# Patient Record
Sex: Female | Born: 1960
Health system: Southern US, Community
[De-identification: ages and names within clinical notes are randomized; demographics above are authoritative.]

## PROBLEM LIST (undated history)

## (undated) DIAGNOSIS — M549 Dorsalgia, unspecified: Secondary | ICD-10-CM

## (undated) DIAGNOSIS — G5600 Carpal tunnel syndrome, unspecified upper limb: Secondary | ICD-10-CM

## (undated) DIAGNOSIS — M779 Enthesopathy, unspecified: Secondary | ICD-10-CM

## (undated) DIAGNOSIS — I10 Essential (primary) hypertension: Secondary | ICD-10-CM

## (undated) DIAGNOSIS — G8929 Other chronic pain: Secondary | ICD-10-CM

## (undated) DIAGNOSIS — M1990 Unspecified osteoarthritis, unspecified site: Secondary | ICD-10-CM

## (undated) DIAGNOSIS — E119 Type 2 diabetes mellitus without complications: Secondary | ICD-10-CM

## (undated) HISTORY — PX: ORTHOPEDIC SURGERY: SHX850

## (undated) HISTORY — DX: Essential (primary) hypertension: I10

## (undated) HISTORY — PX: ABDOMINAL HYSTERECTOMY: SHX81

## (undated) NOTE — *Deleted (*Deleted)
Advice for Weight Management  -For most of us the best way to lose weight is by diet management. Generally speaking, diet management means consuming less calories intentionally which over time brings about progressive weight loss.  This can be achieved more effectively by restricting carbohydrate consumption to the minimum possible.  So, it is critically important to know your numbers: how much calorie you are consuming and how much calorie you need. More importantly, our carbohydrates sources should be unprocessed or minimally processed complex starch food items.   Sometimes, it is important to balance nutrition by increasing protein intake (animal or plant source), fruits, and vegetables.  -Sticking to a routine mealtime to eat 3 meals a day and avoiding unnecessary snacks is shown to have a big role in weight control. Under normal circumstances, the only time we lose real weight is when we are hungry, so allow hunger to take place- hunger means no food between meal times, only water.  It is not advisable to starve.   -It is better to avoid simple carbohydrates including: Cakes, Sweet Desserts, Ice Cream, Soda (diet and regular), Sweet Tea, Candies, Chips, Cookies, Store Bought Juices, Alcohol in Excess of  1-2 drinks a day, Artificial Sweeteners, Doughnuts, Coffee Creamers, "Sugar-free" Products, etc, etc.  This is not a complete list.....    -Consulting with certified diabetes educators is proven to provide you with the most accurate and current information on diet.  Also, you may be  interested in discussing diet options/exchanges , we can schedule a visit with Kari Miles, RDN, CDE for individualized nutrition education.  -Exercise: If you are able: 30 -60 minutes a day ,4 days a week, or 150 minutes a week.  The longer the better.  Combine stretch, strength, and aerobic activities.  If you were told in the past that you have high risk for cardiovascular diseases, you may seek evaluation by  your heart doctor prior to initiating moderate to intense exercise programs.    

---

## 2011-01-12 ENCOUNTER — Other Ambulatory Visit (HOSPITAL_COMMUNITY): Payer: Self-pay | Admitting: Internal Medicine

## 2011-01-12 DIAGNOSIS — Z139 Encounter for screening, unspecified: Secondary | ICD-10-CM

## 2011-01-15 ENCOUNTER — Ambulatory Visit (HOSPITAL_COMMUNITY): Payer: Self-pay

## 2011-02-02 ENCOUNTER — Ambulatory Visit: Payer: Self-pay | Admitting: Urgent Care

## 2011-02-02 ENCOUNTER — Telehealth: Payer: Self-pay | Admitting: Urgent Care

## 2011-02-02 NOTE — Telephone Encounter (Signed)
Routed to # provided. 

## 2013-02-01 ENCOUNTER — Other Ambulatory Visit (HOSPITAL_COMMUNITY): Payer: Self-pay | Admitting: Internal Medicine

## 2013-02-01 DIAGNOSIS — Z139 Encounter for screening, unspecified: Secondary | ICD-10-CM

## 2013-02-01 DIAGNOSIS — R109 Unspecified abdominal pain: Secondary | ICD-10-CM

## 2013-02-03 ENCOUNTER — Ambulatory Visit (HOSPITAL_COMMUNITY): Payer: Self-pay

## 2013-02-06 ENCOUNTER — Ambulatory Visit (HOSPITAL_COMMUNITY): Payer: Medicaid Other

## 2013-02-09 ENCOUNTER — Ambulatory Visit (HOSPITAL_COMMUNITY): Payer: Medicaid Other

## 2013-02-10 ENCOUNTER — Ambulatory Visit (HOSPITAL_COMMUNITY)
Admission: RE | Admit: 2013-02-10 | Discharge: 2013-02-10 | Disposition: A | Discharge status: Home / Self Care | Payer: Medicaid Other | Source: Ambulatory Visit | Attending: Internal Medicine | Admitting: Internal Medicine

## 2013-02-10 DIAGNOSIS — Z1231 Encounter for screening mammogram for malignant neoplasm of breast: Secondary | ICD-10-CM | POA: Insufficient documentation

## 2013-02-10 DIAGNOSIS — R160 Hepatomegaly, not elsewhere classified: Secondary | ICD-10-CM | POA: Insufficient documentation

## 2013-02-10 DIAGNOSIS — Z139 Encounter for screening, unspecified: Secondary | ICD-10-CM

## 2013-02-10 DIAGNOSIS — R109 Unspecified abdominal pain: Secondary | ICD-10-CM | POA: Insufficient documentation

## 2013-04-28 ENCOUNTER — Encounter (HOSPITAL_COMMUNITY): Payer: Self-pay | Admitting: Emergency Medicine

## 2013-04-28 ENCOUNTER — Emergency Department (HOSPITAL_COMMUNITY)
Admission: EM | Admit: 2013-04-28 | Discharge: 2013-04-28 | Disposition: A | Discharge status: Home / Self Care | Payer: Medicaid Other | Attending: Emergency Medicine | Admitting: Emergency Medicine

## 2013-04-28 DIAGNOSIS — F17200 Nicotine dependence, unspecified, uncomplicated: Secondary | ICD-10-CM | POA: Insufficient documentation

## 2013-04-28 DIAGNOSIS — G8929 Other chronic pain: Secondary | ICD-10-CM | POA: Insufficient documentation

## 2013-04-28 DIAGNOSIS — Z79899 Other long term (current) drug therapy: Secondary | ICD-10-CM | POA: Insufficient documentation

## 2013-04-28 DIAGNOSIS — R05 Cough: Secondary | ICD-10-CM | POA: Insufficient documentation

## 2013-04-28 DIAGNOSIS — Z8669 Personal history of other diseases of the nervous system and sense organs: Secondary | ICD-10-CM | POA: Insufficient documentation

## 2013-04-28 DIAGNOSIS — R059 Cough, unspecified: Secondary | ICD-10-CM | POA: Insufficient documentation

## 2013-04-28 DIAGNOSIS — Z794 Long term (current) use of insulin: Secondary | ICD-10-CM | POA: Insufficient documentation

## 2013-04-28 DIAGNOSIS — M129 Arthropathy, unspecified: Secondary | ICD-10-CM | POA: Insufficient documentation

## 2013-04-28 DIAGNOSIS — R739 Hyperglycemia, unspecified: Secondary | ICD-10-CM

## 2013-04-28 DIAGNOSIS — E119 Type 2 diabetes mellitus without complications: Secondary | ICD-10-CM | POA: Insufficient documentation

## 2013-04-28 HISTORY — DX: Enthesopathy, unspecified: M77.9

## 2013-04-28 HISTORY — DX: Other chronic pain: G89.29

## 2013-04-28 HISTORY — DX: Type 2 diabetes mellitus without complications: E11.9

## 2013-04-28 HISTORY — DX: Dorsalgia, unspecified: M54.9

## 2013-04-28 HISTORY — DX: Carpal tunnel syndrome, unspecified upper limb: G56.00

## 2013-04-28 HISTORY — DX: Unspecified osteoarthritis, unspecified site: M19.90

## 2013-04-28 LAB — CBC WITH DIFFERENTIAL/PLATELET
Basophils Absolute: 0.1 10*3/uL (ref 0.0–0.1)
Basophils Relative: 1 % (ref 0–1)
Eosinophils Absolute: 0.1 10*3/uL (ref 0.0–0.7)
Eosinophils Relative: 1 % (ref 0–5)
HCT: 44.6 % (ref 36.0–46.0)
Hemoglobin: 16.2 g/dL — ABNORMAL HIGH (ref 12.0–15.0)
Lymphocytes Relative: 51 % — ABNORMAL HIGH (ref 12–46)
Lymphs Abs: 3.3 10*3/uL (ref 0.7–4.0)
MCH: 30.9 pg (ref 26.0–34.0)
MCHC: 36.3 g/dL — ABNORMAL HIGH (ref 30.0–36.0)
MCV: 85.1 fL (ref 78.0–100.0)
Monocytes Absolute: 0.6 10*3/uL (ref 0.1–1.0)
Monocytes Relative: 8 % (ref 3–12)
Neutro Abs: 2.6 10*3/uL (ref 1.7–7.7)
Neutrophils Relative %: 39 % — ABNORMAL LOW (ref 43–77)
Platelets: 225 10*3/uL (ref 150–400)
RBC: 5.24 MIL/uL — ABNORMAL HIGH (ref 3.87–5.11)
RDW: 12.2 % (ref 11.5–15.5)
WBC: 6.5 10*3/uL (ref 4.0–10.5)

## 2013-04-28 LAB — URINALYSIS, ROUTINE W REFLEX MICROSCOPIC
Bilirubin Urine: NEGATIVE
Glucose, UA: >1000 mg/dL — AB
Hgb urine dipstick: NEGATIVE
Ketones, ur: NEGATIVE mg/dL
Leukocytes, UA: NEGATIVE
Nitrite: NEGATIVE
Protein, ur: NEGATIVE mg/dL
Specific Gravity, Urine: 1.015 (ref 1.005–1.030)
Urobilinogen, UA: 0.2 mg/dL (ref 0.0–1.0)
pH: 6 (ref 5.0–8.0)

## 2013-04-28 LAB — COMPREHENSIVE METABOLIC PANEL
ALT: 101 U/L — ABNORMAL HIGH (ref 0–35)
AST: 60 U/L — ABNORMAL HIGH (ref 0–37)
Albumin: 4.6 g/dL (ref 3.5–5.2)
Alkaline Phosphatase: 77 U/L (ref 39–117)
BUN: 8 mg/dL (ref 6–23)
CO2: 28 mEq/L (ref 19–32)
Calcium: 10.3 mg/dL (ref 8.4–10.5)
Chloride: 92 mEq/L — ABNORMAL LOW (ref 96–112)
Creatinine, Ser: 0.4 mg/dL — ABNORMAL LOW (ref 0.50–1.10)
GFR calc Af Amer: >90 mL/min (ref >90)
GFR calc non Af Amer: >90 mL/min (ref >90)
Glucose, Bld: 363 mg/dL — ABNORMAL HIGH (ref 70–99)
Potassium: 3.9 mEq/L (ref 3.5–5.1)
Sodium: 133 mEq/L — ABNORMAL LOW (ref 135–145)
Total Bilirubin: 0.8 mg/dL (ref 0.3–1.2)
Total Protein: 8.4 g/dL — ABNORMAL HIGH (ref 6.0–8.3)

## 2013-04-28 LAB — URINE MICROSCOPIC-ADD ON

## 2013-04-28 LAB — GLUCOSE, CAPILLARY
Glucose-Capillary: 433 mg/dL — ABNORMAL HIGH (ref 70–99)
Glucose-Capillary: 303 mg/dL — ABNORMAL HIGH (ref 70–99)

## 2013-04-28 LAB — TROPONIN I: Troponin I: <0.3 ng/mL (ref <0.30)

## 2013-04-28 MED ORDER — METFORMIN HCL 500 MG PO TABS: 500.0000 mg | ORAL_TABLET | Freq: Two times a day (BID) | ORAL | Status: DC

## 2013-04-28 MED ORDER — SODIUM CHLORIDE 0.9 % IV BOLUS (SEPSIS)
1000.0000 mL | Freq: Once | INTRAVENOUS | Status: AC
  Administered 2013-04-28: 1000 mL via INTRAVENOUS

## 2013-04-28 NOTE — ED Notes (Signed)
Patient was here w/mother who was having blood checked and decided she would have her blood sugar looked at while waiting for her.  She sees Dr. Felecia Shelling as PMD, but sees separate MD for blood glucose management.  Saw Dr. Felecia Shelling yesterday with elevated CBG which was not addressed.  She is to see her other MD Nov 12.  Currently takes long acting insulin at morning and night in addition to oral agent, none of which she knows the name.  Poor historian regarding diet, but relates she drinks regular Pepsi. Educated her regarding need to stop use of full sugar drinks.

## 2013-04-28 NOTE — ED Notes (Signed)
Pt states she seen PMD yesterday for blood sugar. Before going, cbg was 367. At doctor's office it was 377. Last night CBG was 555.This morning it was 377. States she is unable to to get it down.

## 2013-04-28 NOTE — ED Notes (Signed)
Patient with no complaints at this time. Respirations even and unlabored. Skin warm/dry. Discharge instructions reviewed with patient at this time. Patient given opportunity to voice concerns/ask questions. IV removed per policy and band-aid applied to site. Patient discharged at this time and left Emergency Department with steady gait.  

## 2013-04-28 NOTE — ED Provider Notes (Signed)
CSN: [id number]     Arrival date & time 04/28/13  4098 History  This chart was scribed for Hilario Quarry, MD by Bennett Scrape, ED Scribe. This patient was seen in room APA06/[facility]06 and the patient's care was started at 12:12 PM.   Chief Complaint  Patient presents with  . Hyperglycemia    Patient is a 52 y.o. female presenting with hyperglycemia. The history is provided by the patient. No language interpreter was used.  Hyperglycemia Blood sugar level PTA:  377 Duration:  2 days Timing:  Constant Progression:  Waxing and waning Chronicity:  New Diabetes status:  Controlled with insulin Current diabetic therapy:  Sliding insulin scale Context: not change in medication and not recent change in diet   Associated symptoms: no abdominal pain, no chest pain, no fever, no increased thirst, no shortness of breath and no vomiting   Risk factors: no hx of DKA     HPI Comments: Kari Miles is a 52 y.o. female with a h/o DM who presents to the Emergency Department complaining of noted hyperglycemia for the past 2 days. She was seen by her PMD yesterday and had a CBG of 377. Last night she checked her BS at 555. She decided to come in for evaluation when she checked her CBG at 377 this morning. She is currently taking 30 to 40 unit injections of insulin, twice daily. She is unsure of how much she is supposed to be taking; she thinks that she is on a sliding scale. She states that was taken off of Metformin 500 mg twice daily 'a while ago'. She reports that she does check her BS but is unable to give a baseline.  She denies any missed doses of insulin or change in diet. She denies any prior hospitalizations for DKA. She reports a recent cough but denies any emesis or diarrhea. She is a current everyday smoker.  PCP is Dr. Felecia Shelling  Past Medical History  Diagnosis Date  . Diabetes mellitus without complication   . Arthritis   . Chronic back pain   . Tendonitis   . Carpal tunnel syndrome     Past Surgical History  Procedure Laterality Date  . Orthopedic surgery    . Abdominal hysterectomy    . Cesarean section     No family history on file. History  Substance Use Topics  . Smoking status: Current Every Day Smoker    Types: Cigarettes  . Smokeless tobacco: Not on file  . Alcohol Use: No   No OB history provided.  Review of Systems  Constitutional: Negative for fever and chills.  Respiratory: Positive for cough. Negative for shortness of breath.   Cardiovascular: Negative for chest pain.  Gastrointestinal: Negative for vomiting and abdominal pain.  Endocrine: Negative for polydipsia.  All other systems reviewed and are negative.    Allergies  Review of patient's allergies indicates no known allergies.  Home Medications   Current Outpatient Rx  Name  Route  Sig  Dispense  Refill  . albuterol (PROVENTIL HFA;VENTOLIN HFA) 108 (90 BASE) MCG/ACT inhaler   Inhalation   Inhale 2 puffs into the lungs every 6 (six) hours as needed for wheezing.         Marland Kitchen amLODipine (NORVASC) 10 MG tablet   Oral   Take 10 mg by mouth daily.         Marland Kitchen ibuprofen (ADVIL,MOTRIN) 200 MG tablet   Oral   Take 400 mg by mouth every 6 (six) hours  as needed for pain.         Marland Kitchen lisinopril (PRINIVIL,ZESTRIL) 40 MG tablet   Oral   Take 40 mg by mouth daily.         . metFORMIN (GLUCOPHAGE) 500 MG tablet   Oral   Take 500 mg by mouth 2 (two) times daily with a meal.         . simvastatin (ZOCOR) 20 MG tablet   Oral   Take 20 mg by mouth every evening.          Triage Vitals: BP 170/101  Pulse 106  Temp(Src) 97.9 F (36.6 C) (Oral)  Resp 16  Ht 5\' 4"  (1.626 m)  Wt 183 lb (83.008 kg)  BMI 31.4 kg/m2  SpO2 97%  Physical Exam  Nursing note and vitals reviewed. Constitutional: She is oriented to person, place, and time. She appears well-developed and well-nourished. No distress.  HENT:  Head: Normocephalic and atraumatic.  Eyes: EOM are normal.  Neck: Neck  supple. No tracheal deviation present.  Cardiovascular: Normal rate and regular rhythm.   Pulmonary/Chest: Effort normal and breath sounds normal. No respiratory distress.  Abdominal: Soft. There is no tenderness.  Musculoskeletal: Normal range of motion.  Neurological: She is alert and oriented to person, place, and time.  Skin: Skin is warm and dry.  Psychiatric: She has a normal mood and affect. Her behavior is normal.    ED Course  Procedures (including critical care time)  DIAGNOSTIC STUDIES: Oxygen Saturation is 97% on room air, normal by my interpretation.    COORDINATION OF CARE: 12:16 PM-Discussed treatment plan which includes glucose with pt at bedside and pt agreed to plan.   Labs Review Labs Reviewed  GLUCOSE, CAPILLARY - Abnormal; Notable for the following:    Glucose-Capillary 433 (*)    All other components within normal limits   Imaging Review No results found.  EKG Interpretation   None       MDM  Patient presents with hyperglycemia but it is unclear what she has been taking at home.  She tells me she has not been taking oral hypoglycemics but the medicine tech called her pharmacy and she has not had insulin filled.  Plan advise to take oral medicines and recheck with her doctor next week.  She has no s/s dka and does not appear to have any acutely infectious or other physiologic stress causing elevated blood sugar.  Patient advised of return precautions.    Hilario Quarry, MD 04/28/13 [phone]

## 2016-02-23 DIAGNOSIS — R0789 Other chest pain: Secondary | ICD-10-CM | POA: Diagnosis not present

## 2016-02-23 DIAGNOSIS — E119 Type 2 diabetes mellitus without complications: Secondary | ICD-10-CM | POA: Diagnosis not present

## 2016-02-23 DIAGNOSIS — N390 Urinary tract infection, site not specified: Secondary | ICD-10-CM | POA: Diagnosis not present

## 2016-02-23 DIAGNOSIS — K2900 Acute gastritis without bleeding: Secondary | ICD-10-CM | POA: Diagnosis not present

## 2016-02-23 DIAGNOSIS — E662 Morbid (severe) obesity with alveolar hypoventilation: Secondary | ICD-10-CM | POA: Diagnosis not present

## 2016-02-23 DIAGNOSIS — A419 Sepsis, unspecified organism: Secondary | ICD-10-CM | POA: Diagnosis not present

## 2016-02-23 DIAGNOSIS — J45909 Unspecified asthma, uncomplicated: Secondary | ICD-10-CM | POA: Diagnosis not present

## 2016-02-23 DIAGNOSIS — R0602 Shortness of breath: Secondary | ICD-10-CM | POA: Diagnosis not present

## 2016-02-23 DIAGNOSIS — Z6831 Body mass index (BMI) 31.0-31.9, adult: Secondary | ICD-10-CM | POA: Diagnosis not present

## 2016-02-23 DIAGNOSIS — R6520 Severe sepsis without septic shock: Secondary | ICD-10-CM | POA: Diagnosis not present

## 2016-02-23 DIAGNOSIS — I10 Essential (primary) hypertension: Secondary | ICD-10-CM | POA: Diagnosis not present

## 2016-02-24 DIAGNOSIS — R000 Tachycardia, unspecified: Secondary | ICD-10-CM | POA: Diagnosis not present

## 2016-02-24 DIAGNOSIS — Z6831 Body mass index (BMI) 31.0-31.9, adult: Secondary | ICD-10-CM | POA: Diagnosis not present

## 2016-02-24 DIAGNOSIS — Z833 Family history of diabetes mellitus: Secondary | ICD-10-CM | POA: Diagnosis not present

## 2016-02-24 DIAGNOSIS — Z794 Long term (current) use of insulin: Secondary | ICD-10-CM | POA: Diagnosis not present

## 2016-02-24 DIAGNOSIS — Z79899 Other long term (current) drug therapy: Secondary | ICD-10-CM | POA: Diagnosis not present

## 2016-02-24 DIAGNOSIS — Z720 Tobacco use: Secondary | ICD-10-CM | POA: Diagnosis not present

## 2016-02-24 DIAGNOSIS — J45909 Unspecified asthma, uncomplicated: Secondary | ICD-10-CM | POA: Diagnosis present

## 2016-02-24 DIAGNOSIS — I10 Essential (primary) hypertension: Secondary | ICD-10-CM | POA: Diagnosis present

## 2016-02-24 DIAGNOSIS — E662 Morbid (severe) obesity with alveolar hypoventilation: Secondary | ICD-10-CM | POA: Diagnosis present

## 2016-02-24 DIAGNOSIS — Z8249 Family history of ischemic heart disease and other diseases of the circulatory system: Secondary | ICD-10-CM | POA: Diagnosis not present

## 2016-02-24 DIAGNOSIS — G4730 Sleep apnea, unspecified: Secondary | ICD-10-CM | POA: Diagnosis present

## 2016-02-24 DIAGNOSIS — R0602 Shortness of breath: Secondary | ICD-10-CM | POA: Diagnosis not present

## 2016-02-24 DIAGNOSIS — R0789 Other chest pain: Secondary | ICD-10-CM | POA: Diagnosis present

## 2016-02-24 DIAGNOSIS — E119 Type 2 diabetes mellitus without complications: Secondary | ICD-10-CM | POA: Diagnosis present

## 2016-02-24 DIAGNOSIS — R079 Chest pain, unspecified: Secondary | ICD-10-CM | POA: Diagnosis not present

## 2016-03-11 DIAGNOSIS — Z0000 Encounter for general adult medical examination without abnormal findings: Secondary | ICD-10-CM | POA: Diagnosis not present

## 2016-03-11 DIAGNOSIS — E119 Type 2 diabetes mellitus without complications: Secondary | ICD-10-CM | POA: Diagnosis not present

## 2016-03-11 DIAGNOSIS — E785 Hyperlipidemia, unspecified: Secondary | ICD-10-CM | POA: Diagnosis not present

## 2016-03-11 DIAGNOSIS — Z0001 Encounter for general adult medical examination with abnormal findings: Secondary | ICD-10-CM | POA: Diagnosis not present

## 2016-03-11 DIAGNOSIS — Z23 Encounter for immunization: Secondary | ICD-10-CM | POA: Diagnosis not present

## 2016-03-11 DIAGNOSIS — E1165 Type 2 diabetes mellitus with hyperglycemia: Secondary | ICD-10-CM | POA: Diagnosis not present

## 2016-03-11 DIAGNOSIS — I10 Essential (primary) hypertension: Secondary | ICD-10-CM | POA: Diagnosis not present

## 2016-03-13 ENCOUNTER — Encounter (INDEPENDENT_AMBULATORY_CARE_PROVIDER_SITE_OTHER): Payer: Self-pay | Admitting: Ophthalmology

## 2016-03-17 DIAGNOSIS — Z1231 Encounter for screening mammogram for malignant neoplasm of breast: Secondary | ICD-10-CM | POA: Diagnosis not present

## 2016-03-18 ENCOUNTER — Encounter (INDEPENDENT_AMBULATORY_CARE_PROVIDER_SITE_OTHER): Payer: Self-pay | Admitting: Ophthalmology

## 2016-04-06 ENCOUNTER — Encounter (INDEPENDENT_AMBULATORY_CARE_PROVIDER_SITE_OTHER): Payer: Medicaid Other | Admitting: Ophthalmology

## 2016-06-10 DIAGNOSIS — M170 Bilateral primary osteoarthritis of knee: Secondary | ICD-10-CM | POA: Diagnosis not present

## 2016-06-10 DIAGNOSIS — I10 Essential (primary) hypertension: Secondary | ICD-10-CM | POA: Diagnosis not present

## 2016-06-10 DIAGNOSIS — J410 Simple chronic bronchitis: Secondary | ICD-10-CM | POA: Diagnosis not present

## 2016-06-10 DIAGNOSIS — E1140 Type 2 diabetes mellitus with diabetic neuropathy, unspecified: Secondary | ICD-10-CM | POA: Diagnosis not present

## 2016-07-02 ENCOUNTER — Encounter: Payer: Self-pay | Admitting: Orthopaedic Surgery

## 2016-07-02 ENCOUNTER — Ambulatory Visit (INDEPENDENT_AMBULATORY_CARE_PROVIDER_SITE_OTHER): Payer: Medicare Other

## 2016-07-02 ENCOUNTER — Ambulatory Visit (INDEPENDENT_AMBULATORY_CARE_PROVIDER_SITE_OTHER): Payer: Medicare Other | Admitting: Orthopaedic Surgery

## 2016-07-02 VITALS — BP 142/88 | HR 89 | Temp 97.7°F | Ht 65.0 in | Wt 186.0 lb

## 2016-07-02 DIAGNOSIS — M25562 Pain in left knee: Secondary | ICD-10-CM | POA: Diagnosis not present

## 2016-07-02 DIAGNOSIS — F17210 Nicotine dependence, cigarettes, uncomplicated: Secondary | ICD-10-CM | POA: Diagnosis not present

## 2016-07-02 DIAGNOSIS — G8929 Other chronic pain: Secondary | ICD-10-CM

## 2016-07-02 MED ORDER — NAPROXEN 500 MG PO TABS: 500.0000 mg | ORAL_TABLET | Freq: Two times a day (BID) | ORAL | 5 refills | Status: DC

## 2016-07-02 MED ORDER — HYDROCODONE-ACETAMINOPHEN 7.5-325 MG PO TABS: ORAL_TABLET | ORAL | 0 refills | Status: DC

## 2016-07-02 NOTE — Progress Notes (Addendum)
Subjective:    Patient ID: Kari Miles, female    DOB: 1961/05/02, 56 y.o.   MRN: [id number]  HPI She has pain of both knees, more on the left for over a year.  About two months ago she started having giving way of the left knee and much more swelling.  She saw Dr. Felecia ShellingFanta for this 06-10-16.  I have copies of his notes. He asked she come here. She has tried ice, heat, rubs, Advil with no help.  The giving way is worse. She is falling.  She has no other joint problems and no redness.  She has no numbness.  She is a smoker and is willing to cut back.  Review of Systems  HENT: Negative for congestion.   Respiratory: Negative for cough and shortness of breath.   Cardiovascular: Negative for chest pain and leg swelling.  Endocrine: Positive for cold intolerance.  Musculoskeletal: Positive for arthralgias and gait problem.  Allergic/Immunologic: Positive for environmental allergies.   Past Medical History:  Diagnosis Date  . Arthritis   . Carpal tunnel syndrome   . Chronic back pain   . Diabetes mellitus without complication (HCC)   . Tendonitis     Past Surgical History:  Procedure Laterality Date  . ABDOMINAL HYSTERECTOMY    . CESAREAN SECTION    . ORTHOPEDIC SURGERY      Current Outpatient Prescriptions on File Prior to Visit  Medication Sig Dispense Refill  . albuterol (PROVENTIL HFA;VENTOLIN HFA) 108 (90 BASE) MCG/ACT inhaler Inhale 2 puffs into the lungs every 6 (six) hours as needed for wheezing.    Marland Kitchen. amLODipine (NORVASC) 10 MG tablet Take 10 mg by mouth daily.    Marland Kitchen. ibuprofen (ADVIL,MOTRIN) 200 MG tablet Take 400 mg by mouth every 6 (six) hours as needed for pain.    Marland Kitchen. lisinopril (PRINIVIL,ZESTRIL) 40 MG tablet Take 40 mg by mouth daily.    . metFORMIN (GLUCOPHAGE) 500 MG tablet Take 1 tablet (500 mg total) by mouth 2 (two) times daily with a meal. 60 tablet 1  . simvastatin (ZOCOR) 20 MG tablet Take 20 mg by mouth every evening.     No current facility-administered  medications on file prior to visit.     Social History   Social History  . Marital status: Widowed    Spouse name: N/A  . Number of children: N/A  . Years of education: N/A   Occupational History  . Not on file.   Social History Main Topics  . Smoking status: Current Every Day Smoker    Types: Cigarettes  . Smokeless tobacco: Never Used  . Alcohol use No  . Drug use: No  . Sexual activity: Not on file   Other Topics Concern  . Not on file   Social History Narrative  . No narrative on file    Family History  Problem Relation Age of Onset  . Seizures Mother   . Heart disease Father   . Cancer Sister     BP (!) 142/88   Pulse 89   Temp 97.7 F (36.5 C)   Ht 5\' 5"  (1.651 m)   Wt 186 lb (84.4 kg)   BMI 30.95 kg/m      Objective:   Physical Exam  Constitutional: She is oriented to person, place, and time. She appears well-developed and well-nourished.  HENT:  Head: Normocephalic and atraumatic.  Eyes: Conjunctivae and EOM are normal. Pupils are equal, round, and reactive to light.  Neck: Normal  range of motion. Neck supple.  Cardiovascular: Normal rate, regular rhythm and intact distal pulses.   Pulmonary/Chest: Effort normal.  Abdominal: Soft.  Musculoskeletal: She exhibits tenderness (Pain left knee with slight effusion, stable, more medial joint line pain and positive Medial McMurray sign.  Crepitus.  Right knee has crepitus but full motion.  Left knee 0-105.).  Neurological: She is alert and oriented to person, place, and time. She displays normal reflexes. No cranial nerve deficit. She exhibits normal muscle tone. Coordination normal.  Skin: Skin is warm and dry.  Psychiatric: She has a normal mood and affect. Her behavior is normal. Judgment and thought content normal.    X-rays were done of the left knee, reported separately      Assessment & Plan:   Encounter Diagnoses  Name Primary?  . Chronic pain of left knee Yes  . Cigarette nicotine  dependence without complication    PROCEDURE NOTE:  The patient requests injections of the left knee , verbal consent was obtained.  The left knee was prepped appropriately after time out was performed.   Sterile technique was observed and injection of 1 cc of Depo-Medrol 40 mg with several cc's of plain xylocaine. Anesthesia was provided by ethyl chloride and a 20-gauge needle was used to inject the knee area. The injection was tolerated well.  A band aid dressing was applied.  The patient was advised to apply ice later today and tomorrow to the injection sight as needed.  Begin Naprosyn.  Return in two weeks.  Rx for pain given for five days after looking at state narcotic site.  Call if any problem.  Precautions discussed.  Consider MRI if not improved.  Electronically Signed Darreld Mclean, MD 1/4/20189:38 AM

## 2016-07-02 NOTE — Addendum Note (Signed)
Addended by: Earnstine RegalKEELING, JOHN W on: 07/02/2016 10:22 AM   Modules accepted: Orders

## 2016-07-03 DIAGNOSIS — E119 Type 2 diabetes mellitus without complications: Secondary | ICD-10-CM | POA: Diagnosis not present

## 2016-07-03 DIAGNOSIS — E113292 Type 2 diabetes mellitus with mild nonproliferative diabetic retinopathy without macular edema, left eye: Secondary | ICD-10-CM | POA: Diagnosis not present

## 2016-07-09 ENCOUNTER — Ambulatory Visit: Payer: Medicaid Other | Admitting: Orthopaedic Surgery

## 2016-07-22 ENCOUNTER — Ambulatory Visit: Payer: Medicaid Other | Admitting: Orthopaedic Surgery

## 2016-07-30 ENCOUNTER — Ambulatory Visit: Payer: Medicaid Other | Admitting: Orthopaedic Surgery

## 2016-08-05 ENCOUNTER — Ambulatory Visit: Payer: Medicaid Other | Admitting: Orthopaedic Surgery

## 2016-09-02 DIAGNOSIS — H40051 Ocular hypertension, right eye: Secondary | ICD-10-CM | POA: Diagnosis not present

## 2016-09-16 DIAGNOSIS — F17208 Nicotine dependence, unspecified, with other nicotine-induced disorders: Secondary | ICD-10-CM | POA: Diagnosis not present

## 2016-09-16 DIAGNOSIS — Z0000 Encounter for general adult medical examination without abnormal findings: Secondary | ICD-10-CM | POA: Diagnosis not present

## 2016-09-16 DIAGNOSIS — E119 Type 2 diabetes mellitus without complications: Secondary | ICD-10-CM | POA: Diagnosis not present

## 2016-09-16 DIAGNOSIS — E1140 Type 2 diabetes mellitus with diabetic neuropathy, unspecified: Secondary | ICD-10-CM | POA: Diagnosis not present

## 2016-09-16 DIAGNOSIS — E785 Hyperlipidemia, unspecified: Secondary | ICD-10-CM | POA: Diagnosis not present

## 2016-09-16 DIAGNOSIS — I10 Essential (primary) hypertension: Secondary | ICD-10-CM | POA: Diagnosis not present

## 2016-09-16 DIAGNOSIS — F17218 Nicotine dependence, cigarettes, with other nicotine-induced disorders: Secondary | ICD-10-CM | POA: Diagnosis not present

## 2016-09-16 DIAGNOSIS — J410 Simple chronic bronchitis: Secondary | ICD-10-CM | POA: Diagnosis not present

## 2016-09-24 DIAGNOSIS — F17200 Nicotine dependence, unspecified, uncomplicated: Secondary | ICD-10-CM | POA: Diagnosis not present

## 2016-09-24 DIAGNOSIS — E1140 Type 2 diabetes mellitus with diabetic neuropathy, unspecified: Secondary | ICD-10-CM | POA: Diagnosis not present

## 2016-09-24 DIAGNOSIS — I10 Essential (primary) hypertension: Secondary | ICD-10-CM | POA: Diagnosis not present

## 2016-10-23 DIAGNOSIS — R079 Chest pain, unspecified: Secondary | ICD-10-CM | POA: Diagnosis not present

## 2016-10-23 DIAGNOSIS — Z8249 Family history of ischemic heart disease and other diseases of the circulatory system: Secondary | ICD-10-CM | POA: Diagnosis not present

## 2016-10-23 DIAGNOSIS — M1990 Unspecified osteoarthritis, unspecified site: Secondary | ICD-10-CM | POA: Diagnosis not present

## 2016-10-23 DIAGNOSIS — J45909 Unspecified asthma, uncomplicated: Secondary | ICD-10-CM | POA: Diagnosis not present

## 2016-10-23 DIAGNOSIS — Z794 Long term (current) use of insulin: Secondary | ICD-10-CM | POA: Diagnosis not present

## 2016-10-23 DIAGNOSIS — I10 Essential (primary) hypertension: Secondary | ICD-10-CM | POA: Diagnosis not present

## 2016-10-23 DIAGNOSIS — F17200 Nicotine dependence, unspecified, uncomplicated: Secondary | ICD-10-CM | POA: Diagnosis not present

## 2016-10-23 DIAGNOSIS — Z79899 Other long term (current) drug therapy: Secondary | ICD-10-CM | POA: Diagnosis not present

## 2016-10-23 DIAGNOSIS — E1165 Type 2 diabetes mellitus with hyperglycemia: Secondary | ICD-10-CM | POA: Diagnosis not present

## 2016-10-23 DIAGNOSIS — Z833 Family history of diabetes mellitus: Secondary | ICD-10-CM | POA: Diagnosis not present

## 2016-10-28 DIAGNOSIS — H40051 Ocular hypertension, right eye: Secondary | ICD-10-CM | POA: Diagnosis not present

## 2017-01-28 DIAGNOSIS — H40051 Ocular hypertension, right eye: Secondary | ICD-10-CM | POA: Diagnosis not present

## 2017-03-09 DIAGNOSIS — M170 Bilateral primary osteoarthritis of knee: Secondary | ICD-10-CM | POA: Diagnosis not present

## 2017-03-09 DIAGNOSIS — J410 Simple chronic bronchitis: Secondary | ICD-10-CM | POA: Diagnosis not present

## 2017-03-09 DIAGNOSIS — E1140 Type 2 diabetes mellitus with diabetic neuropathy, unspecified: Secondary | ICD-10-CM | POA: Diagnosis not present

## 2017-03-09 DIAGNOSIS — F17200 Nicotine dependence, unspecified, uncomplicated: Secondary | ICD-10-CM | POA: Diagnosis not present

## 2017-03-09 DIAGNOSIS — Z23 Encounter for immunization: Secondary | ICD-10-CM | POA: Diagnosis not present

## 2017-03-31 DIAGNOSIS — R928 Other abnormal and inconclusive findings on diagnostic imaging of breast: Secondary | ICD-10-CM | POA: Diagnosis not present

## 2017-03-31 DIAGNOSIS — N6324 Unspecified lump in the left breast, lower inner quadrant: Secondary | ICD-10-CM | POA: Diagnosis not present

## 2017-03-31 DIAGNOSIS — N6489 Other specified disorders of breast: Secondary | ICD-10-CM | POA: Diagnosis not present

## 2017-06-12 DIAGNOSIS — R079 Chest pain, unspecified: Secondary | ICD-10-CM | POA: Diagnosis not present

## 2017-06-12 DIAGNOSIS — E1140 Type 2 diabetes mellitus with diabetic neuropathy, unspecified: Secondary | ICD-10-CM | POA: Diagnosis not present

## 2017-06-12 DIAGNOSIS — F17210 Nicotine dependence, cigarettes, uncomplicated: Secondary | ICD-10-CM | POA: Diagnosis not present

## 2017-06-12 DIAGNOSIS — Z9119 Patient's noncompliance with other medical treatment and regimen: Secondary | ICD-10-CM | POA: Diagnosis not present

## 2017-06-12 DIAGNOSIS — Z794 Long term (current) use of insulin: Secondary | ICD-10-CM | POA: Diagnosis not present

## 2017-06-12 DIAGNOSIS — R0789 Other chest pain: Secondary | ICD-10-CM | POA: Diagnosis not present

## 2017-06-12 DIAGNOSIS — I10 Essential (primary) hypertension: Secondary | ICD-10-CM | POA: Diagnosis not present

## 2017-06-12 DIAGNOSIS — K5900 Constipation, unspecified: Secondary | ICD-10-CM | POA: Diagnosis not present

## 2017-06-12 DIAGNOSIS — E669 Obesity, unspecified: Secondary | ICD-10-CM | POA: Diagnosis not present

## 2017-06-12 DIAGNOSIS — E1165 Type 2 diabetes mellitus with hyperglycemia: Secondary | ICD-10-CM | POA: Diagnosis not present

## 2017-06-12 DIAGNOSIS — Z79899 Other long term (current) drug therapy: Secondary | ICD-10-CM | POA: Diagnosis not present

## 2017-06-16 ENCOUNTER — Other Ambulatory Visit (HOSPITAL_COMMUNITY): Payer: Self-pay | Admitting: Internal Medicine

## 2017-06-16 DIAGNOSIS — E119 Type 2 diabetes mellitus without complications: Secondary | ICD-10-CM | POA: Diagnosis not present

## 2017-06-16 DIAGNOSIS — Z1389 Encounter for screening for other disorder: Secondary | ICD-10-CM | POA: Diagnosis not present

## 2017-06-16 DIAGNOSIS — Z1231 Encounter for screening mammogram for malignant neoplasm of breast: Secondary | ICD-10-CM

## 2017-06-16 DIAGNOSIS — E1140 Type 2 diabetes mellitus with diabetic neuropathy, unspecified: Secondary | ICD-10-CM | POA: Diagnosis not present

## 2017-06-16 DIAGNOSIS — E785 Hyperlipidemia, unspecified: Secondary | ICD-10-CM | POA: Diagnosis not present

## 2017-06-16 DIAGNOSIS — I10 Essential (primary) hypertension: Secondary | ICD-10-CM | POA: Diagnosis not present

## 2017-06-16 DIAGNOSIS — Z0000 Encounter for general adult medical examination without abnormal findings: Secondary | ICD-10-CM | POA: Diagnosis not present

## 2017-06-16 DIAGNOSIS — J410 Simple chronic bronchitis: Secondary | ICD-10-CM | POA: Diagnosis not present

## 2017-06-16 DIAGNOSIS — M170 Bilateral primary osteoarthritis of knee: Secondary | ICD-10-CM | POA: Diagnosis not present

## 2017-06-16 LAB — BASIC METABOLIC PANEL
BUN: 13 (ref 4–21)
Creatinine: 0.6 (ref <=1.1)

## 2017-06-16 LAB — LIPID PANEL
Cholesterol: 160 (ref 0–200)
HDL: 41 (ref 35–70)
LDL Cholesterol: 104
Triglycerides: 60 (ref 40–160)

## 2017-06-16 LAB — HEMOGLOBIN A1C: Hemoglobin A1C: 12

## 2017-06-28 ENCOUNTER — Ambulatory Visit (HOSPITAL_COMMUNITY): Payer: Medicare Other

## 2017-07-13 DIAGNOSIS — H25013 Cortical age-related cataract, bilateral: Secondary | ICD-10-CM | POA: Diagnosis not present

## 2017-07-13 DIAGNOSIS — E113391 Type 2 diabetes mellitus with moderate nonproliferative diabetic retinopathy without macular edema, right eye: Secondary | ICD-10-CM | POA: Diagnosis not present

## 2017-07-13 DIAGNOSIS — I10 Essential (primary) hypertension: Secondary | ICD-10-CM | POA: Diagnosis not present

## 2017-07-13 DIAGNOSIS — E113312 Type 2 diabetes mellitus with moderate nonproliferative diabetic retinopathy with macular edema, left eye: Secondary | ICD-10-CM | POA: Diagnosis not present

## 2017-07-13 DIAGNOSIS — H40013 Open angle with borderline findings, low risk, bilateral: Secondary | ICD-10-CM | POA: Diagnosis not present

## 2017-07-13 DIAGNOSIS — H40053 Ocular hypertension, bilateral: Secondary | ICD-10-CM | POA: Diagnosis not present

## 2017-07-13 DIAGNOSIS — H2513 Age-related nuclear cataract, bilateral: Secondary | ICD-10-CM | POA: Diagnosis not present

## 2017-07-13 DIAGNOSIS — Z794 Long term (current) use of insulin: Secondary | ICD-10-CM | POA: Diagnosis not present

## 2017-07-13 DIAGNOSIS — H35033 Hypertensive retinopathy, bilateral: Secondary | ICD-10-CM | POA: Diagnosis not present

## 2017-07-23 ENCOUNTER — Encounter: Payer: Self-pay | Admitting: "Endocrinology

## 2017-07-23 ENCOUNTER — Other Ambulatory Visit: Payer: Self-pay

## 2017-07-23 ENCOUNTER — Ambulatory Visit (INDEPENDENT_AMBULATORY_CARE_PROVIDER_SITE_OTHER): Payer: Medicare Other | Admitting: "Endocrinology

## 2017-07-23 VITALS — BP 166/94 | HR 93 | Ht 64.0 in | Wt 188.0 lb

## 2017-07-23 DIAGNOSIS — E6609 Other obesity due to excess calories: Secondary | ICD-10-CM | POA: Insufficient documentation

## 2017-07-23 DIAGNOSIS — E1165 Type 2 diabetes mellitus with hyperglycemia: Secondary | ICD-10-CM | POA: Diagnosis not present

## 2017-07-23 DIAGNOSIS — Z6832 Body mass index (BMI) 32.0-32.9, adult: Secondary | ICD-10-CM | POA: Diagnosis not present

## 2017-07-23 DIAGNOSIS — I10 Essential (primary) hypertension: Secondary | ICD-10-CM

## 2017-07-23 DIAGNOSIS — Z91199 Patient's noncompliance with other medical treatment and regimen due to unspecified reason: Secondary | ICD-10-CM | POA: Insufficient documentation

## 2017-07-23 DIAGNOSIS — Z9119 Patient's noncompliance with other medical treatment and regimen: Secondary | ICD-10-CM

## 2017-07-23 MED ORDER — METFORMIN HCL 500 MG PO TABS: 500.0000 mg | ORAL_TABLET | Freq: Two times a day (BID) | ORAL | 2 refills | Status: DC

## 2017-07-23 MED ORDER — ACCU-CHEK GUIDE W/DEVICE KIT: 1.0000 | PACK | 0 refills | Status: DC

## 2017-07-23 MED ORDER — GLUCOSE BLOOD VI STRP: ORAL_STRIP | 2 refills | Status: DC

## 2017-07-23 MED ORDER — GLUCOSE BLOOD VI STRP: ORAL_STRIP | 3 refills | Status: DC

## 2017-07-23 NOTE — Progress Notes (Signed)
Consult Note       07/23/2017, 1:26 PM   Subjective:    Patient ID: Kari Miles, female    DOB: 08/02/1960.  Kari Miles is being seen in consultation for management of currently uncontrolled symptomatic diabetes requested by  Rosita Fire, MD.   Past Medical History:  Diagnosis Date  . Arthritis   . Carpal tunnel syndrome   . Chronic back pain   . Diabetes mellitus without complication ([place])   . Hypertension   . Tendonitis    Past Surgical History:  Procedure Laterality Date  . ABDOMINAL HYSTERECTOMY    . CESAREAN SECTION    . ORTHOPEDIC SURGERY     Social History   Socioeconomic History  . Marital status: Widowed    Spouse name: None  . Number of children: None  . Years of education: None  . Highest education level: None  Social Needs  . Financial resource strain: None  . Food insecurity - worry: None  . Food insecurity - inability: None  . Transportation needs - medical: None  . Transportation needs - non-medical: None  Occupational History  . None  Tobacco Use  . Smoking status: Current Every Day Smoker    Types: Cigarettes  . Smokeless tobacco: Never Used  Substance and Sexual Activity  . Alcohol use: No  . Drug use: No  . Sexual activity: None  Other Topics Concern  . None  Social History Narrative  . None   Outpatient Encounter Medications as of 07/23/2017  Medication Sig  . amLODipine (NORVASC) 10 MG tablet Take 10 mg by mouth daily.  . hydrochlorothiazide (HYDRODIURIL) 25 MG tablet Take 25 mg by mouth daily.  . Insulin Glargine (LANTUS SOLOSTAR) 100 UNIT/ML Solostar Pen Inject 70 Units into the skin at bedtime.  . insulin lispro (HUMALOG KWIKPEN) 100 UNIT/ML KiwkPen Inject 10-16 Units into the skin 3 (three) times daily before meals.  Marland Kitchen lisinopril (PRINIVIL,ZESTRIL) 40 MG tablet Take 40 mg by mouth daily.  . naproxen (NAPROSYN) 500 MG tablet Take 1 tablet (500  mg total) by mouth 2 (two) times daily with a meal.  . omeprazole (PRILOSEC) 20 MG capsule Take 20 mg by mouth daily.  . [DISCONTINUED] lisinopril (PRINIVIL,ZESTRIL) 40 MG tablet Take 40 mg by mouth daily.  . metFORMIN (GLUCOPHAGE) 500 MG tablet Take 1 tablet (500 mg total) by mouth 2 (two) times daily with a meal.  . [DISCONTINUED] albuterol (PROVENTIL HFA;VENTOLIN HFA) 108 (90 BASE) MCG/ACT inhaler Inhale 2 puffs into the lungs every 6 (six) hours as needed for wheezing.  . [DISCONTINUED] Blood Glucose Monitoring Suppl (ACCU-CHEK GUIDE) w/Device KIT 1 Piece by Does not apply route as directed.  . [DISCONTINUED] glucose blood (ACCU-CHEK GUIDE) test strip Use as instructed  . [DISCONTINUED] HYDROcodone-acetaminophen (NORCO) 7.5-325 MG tablet One tablet every four hours as needed for pain.  Five day supply for acute pain per [facility].  . [DISCONTINUED] ibuprofen (ADVIL,MOTRIN) 200 MG tablet Take 400 mg by mouth every 6 (six) hours as needed for pain.  . [DISCONTINUED] lisinopril (PRINIVIL,ZESTRIL) 40 MG tablet Take 40 mg by mouth daily.  . [DISCONTINUED]  metFORMIN (GLUCOPHAGE) 500 MG tablet Take 1 tablet (500 mg total) by mouth 2 (two) times daily with a meal.  . [DISCONTINUED] simvastatin (ZOCOR) 20 MG tablet Take 20 mg by mouth every evening.   No facility-administered encounter medications on file as of 07/23/2017.     ALLERGIES: No Known Allergies  VACCINATION STATUS:  There is no immunization history on file for this patient.  Diabetes  She presents for her initial diabetic visit. She has type 2 diabetes mellitus. Onset time: she was diagnosed at approximate age of 82 years. Her disease course has been worsening. There are no hypoglycemic associated symptoms. Pertinent negatives for hypoglycemia include no confusion, headaches, pallor or seizures. Associated symptoms include blurred vision, fatigue, polydipsia and polyuria. Pertinent negatives for diabetes include no chest pain and  no polyphagia. There are no hypoglycemic complications. Symptoms are worsening. There are no diabetic complications. Risk factors for coronary artery disease include diabetes mellitus, dyslipidemia, family history, hypertension, obesity, sedentary lifestyle and tobacco exposure. Current diabetic treatment includes insulin injections (She takes Lantus 70 units nightly, Humalog 20 units 3 times a day before meals). Her weight is increasing steadily. She is following a generally unhealthy diet. When asked about meal planning, she reported none. She has not had a previous visit with a dietitian. She never participates in exercise. (She did not bring any meter nor logs to review today. Her most recent A1c was 12% on 06/16/2017. ) An ACE inhibitor/angiotensin II receptor blocker is being taken. She does not see a podiatrist.Eye exam is not current.  Hypertension  This is a chronic problem. The current episode started more than 1 year ago. The problem is uncontrolled. Associated symptoms include blurred vision. Pertinent negatives include no chest pain, headaches, palpitations or shortness of breath. Risk factors for coronary artery disease include diabetes mellitus, obesity, sedentary lifestyle, smoking/tobacco exposure and family history. Past treatments include ACE inhibitors. Compliance problems include psychosocial issues and diet.       Review of Systems  Constitutional: Positive for fatigue. Negative for chills, fever and unexpected weight change.  HENT: Negative for trouble swallowing and voice change.   Eyes: Positive for blurred vision. Negative for visual disturbance.  Respiratory: Negative for cough, shortness of breath and wheezing.   Cardiovascular: Negative for chest pain, palpitations and leg swelling.  Gastrointestinal: Negative for diarrhea, nausea and vomiting.  Endocrine: Positive for polydipsia and polyuria. Negative for cold intolerance, heat intolerance and polyphagia.   Musculoskeletal: Negative for arthralgias and myalgias.  Skin: Negative for color change, pallor, rash and wound.  Neurological: Negative for seizures and headaches.  Psychiatric/Behavioral: Negative for confusion and suicidal ideas.    Objective:    BP (!) 166/94   Pulse 93   Ht 5' 4" (1.626 m)   Wt 188 lb (85.3 kg)   BMI 32.27 kg/m   Wt Readings from Last 3 Encounters:  07/23/17 188 lb (85.3 kg)  07/02/16 186 lb (84.4 kg)  04/28/13 183 lb (83 kg)     Physical Exam  Constitutional: She is oriented to person, place, and time. She appears well-developed.  HENT:  Head: Normocephalic and atraumatic.  Eyes: EOM are normal.  Neck: Normal range of motion. Neck supple. No tracheal deviation present. No thyromegaly present.  Cardiovascular: Normal rate and regular rhythm.  Pulmonary/Chest: Effort normal and breath sounds normal.  Abdominal: Soft. Bowel sounds are normal. There is no tenderness. There is no guarding.  Musculoskeletal: Normal range of motion. She exhibits no edema.  Neurological: She  is alert and oriented to person, place, and time. She has normal reflexes. No cranial nerve deficit. Coordination normal.  Skin: Skin is warm and dry. No rash noted. No erythema. No pallor.  Psychiatric: She has a normal mood and affect. Judgment normal.  Patient has a reluctant, unconcerned affect.    CMP ( most recent) CMP     Component Value Date/Time   NA 133 (L) 04/28/2013 1236   K 3.9 04/28/2013 1236   CL 92 (L) 04/28/2013 1236   CO2 28 04/28/2013 1236   GLUCOSE 363 (H) 04/28/2013 1236   BUN 13 06/16/2017   CREATININE 0.6 06/16/2017   CREATININE 0.40 (L) 04/28/2013 1236   CALCIUM 10.3 04/28/2013 1236   PROT 8.4 (H) 04/28/2013 1236   ALBUMIN 4.6 04/28/2013 1236   AST 60 (H) 04/28/2013 1236   ALT 101 (H) 04/28/2013 1236   ALKPHOS 77 04/28/2013 1236   BILITOT 0.8 04/28/2013 1236   GFRNONAA >90 04/28/2013 1236   GFRAA >90 04/28/2013 1236     Diabetic Labs (most  recent): Lab Results  Component Value Date   HGBA1C 12 06/16/2017     Lipid Panel ( most recent) Lipid Panel     Component Value Date/Time   CHOL 160 06/16/2017   TRIG 60 06/16/2017   HDL 41 06/16/2017   LDLCALC 104 06/16/2017      Assessment & Plan:   1. Uncontrolled type 2 diabetes mellitus with hyperglycemia ([place])  - Kari Miles has currently uncontrolled symptomatic type 2 DM since  57 years of age,  with most recent A1c of 12 %. Recent labs reviewed. - This patient was seen by me in 2014, and lost to follow-up for unclear reasons. -her diabetes is complicated by noncompliance/nonadherence, chronic heavy smoking and YSELA HETTINGER remains at a high risk for more acute and chronic complications which include CAD, CVA, CKD, retinopathy, and neuropathy. These are all discussed in detail with the patient.  - I have counseled her on diet management and weight loss, by adopting a carbohydrate restricted/protein rich diet.  - Suggestion is made for her to avoid simple carbohydrates  from her diet including Cakes, Sweet Desserts, Ice Cream, Soda (diet and regular), Sweet Tea, Candies, Chips, Cookies, Store Bought Juices, Alcohol in Excess of  1-2 drinks a day, Artificial Sweeteners, and "Sugar-free" Products. This will help patient to have stable blood glucose profile and potentially avoid unintended weight gain.  - I encouraged her to switch to  unprocessed or minimally processed complex starch and increased protein intake (animal or plant source), fruits, and vegetables.  - she is advised to stick to a routine mealtimes to eat 3 meals  a day and avoid unnecessary snacks ( to snack only to correct hypoglycemia).   - she will be scheduled with Jearld Fenton, RDN, CDE for individualized diabetes education.  - I have approached her with the following individualized plan to manage diabetes and patient agrees:   - Based on her current glycemic burden with A1c of 12%, she'll continue  to require intensive treatment with basal/bolus insulin.  - I  will proceed to readjust her Lantus to 70 units daily at bedtime, and prandial insulin Humalog to 10 units 3 times a day with meals  for pre-meal BG readings of 90-113m/dl, plus patient specific correction dose for unexpected hyperglycemia above 1511mdl, associated with strict monitoring of glucose 4 times a day-before meals and at bedtime. - Patient is warned not to take insulin without proper monitoring  per orders. -Adjustment parameters are given for hypo and hyperglycemia in writing. -Patient is encouraged to call clinic for blood glucose levels less than 70 or above 300 mg /dl. - Her renal function is normal and does not recall any adverse effects from metformin treatment. I discussed and reinitiated low-dose metformin 500 mg by mouth twice a day before meals.  - she will be considered for incretin therapy as appropriate next visit. - Patient specific target  A1c;  LDL, HDL, Triglycerides, and  Waist Circumference were discussed in detail.  2) BP/HTN: Her blood pressure is not controlled to target. I have advised her to continue hydrocortisone 25 mg by mouth daily, lisinopril 40 mg by mouth daily, amlodipine 10 mg by mouth daily.   3) Lipids/HPL:   Uncontrolled with LDL of 104, she is not on statins. She will be considered for low-dose statin by next visit.  4)  Weight/Diet: CDE Consult will be initiated , exercise, and detailed carbohydrates information provided.  5) Chronic Care/Health Maintenance:  -she  is on ACEI medications and  is encouraged to continue to follow up with Ophthalmology, Dentist,  Podiatrist at least yearly or according to recommendations, and advised to  quit smoking. I have recommended yearly flu vaccine and pneumonia vaccination at least every 5 years; moderate intensity exercise for up to 150 minutes weekly; and  sleep for at least 7 hours a day.  - I advised patient to maintain close follow up with  Rosita Fire, MD for primary care needs.  - Time spent with the patient: 1 hour, of which >50% was spent in obtaining information about her symptoms, reviewing her previous labs, evaluations, and treatments, counseling her about her  currently uncontrolled type 2 diabetes, hypertension, hyperlipidemia, and developing a plan for long term treatment; her  questions were answered to her satisfaction.  Follow up plan: - Return in about 1 week (around 07/30/2017) for follow up with meter and logs- no labs.  Glade Lloyd, MD [facility] [address] [place], [place] [address] Phone: [phone]  Fax: [phone]    07/23/2017, 1:26 PM  This note was partially dictated with voice recognition software. Similar sounding words can be transcribed inadequately or may not  be corrected upon review.

## 2017-07-23 NOTE — Patient Instructions (Signed)

## 2017-07-29 ENCOUNTER — Encounter: Payer: Medicare Other | Admitting: "Endocrinology

## 2017-07-29 MED ORDER — INSULIN GLARGINE 100 UNIT/ML SOLOSTAR PEN: 70.0000 [IU] | PEN_INJECTOR | Freq: Every day | SUBCUTANEOUS | 2 refills | Status: DC

## 2017-07-29 MED ORDER — INSULIN LISPRO 100 UNIT/ML (KWIKPEN): 10.0000 [IU] | PEN_INJECTOR | Freq: Three times a day (TID) | SUBCUTANEOUS | 2 refills | Status: DC

## 2017-07-29 MED ORDER — GLUCOSE BLOOD VI STRP: 1.0000 | ORAL_STRIP | Freq: Four times a day (QID) | 5 refills | Status: AC

## 2017-08-01 NOTE — Progress Notes (Signed)
This encounter was created in error - please disregard.

## 2017-08-17 ENCOUNTER — Encounter: Payer: Self-pay | Admitting: "Endocrinology

## 2017-08-17 ENCOUNTER — Ambulatory Visit (INDEPENDENT_AMBULATORY_CARE_PROVIDER_SITE_OTHER): Payer: Medicare Other | Admitting: "Endocrinology

## 2017-08-17 VITALS — BP 145/80 | HR 93 | Ht 64.0 in | Wt 188.0 lb

## 2017-08-17 DIAGNOSIS — I10 Essential (primary) hypertension: Secondary | ICD-10-CM | POA: Diagnosis not present

## 2017-08-17 DIAGNOSIS — Z6832 Body mass index (BMI) 32.0-32.9, adult: Secondary | ICD-10-CM

## 2017-08-17 DIAGNOSIS — E6609 Other obesity due to excess calories: Secondary | ICD-10-CM

## 2017-08-17 DIAGNOSIS — E1165 Type 2 diabetes mellitus with hyperglycemia: Secondary | ICD-10-CM | POA: Diagnosis not present

## 2017-08-17 MED ORDER — METFORMIN HCL 500 MG PO TABS: 500.0000 mg | ORAL_TABLET | Freq: Two times a day (BID) | ORAL | 2 refills | Status: DC

## 2017-08-17 MED ORDER — INSULIN LISPRO 100 UNIT/ML (KWIKPEN): 15.0000 [IU] | PEN_INJECTOR | Freq: Three times a day (TID) | SUBCUTANEOUS | 2 refills | Status: DC

## 2017-08-17 NOTE — Progress Notes (Signed)
Consult Note       08/17/2017, 2:33 PM   Subjective:    Patient ID: Kari Miles, female    DOB: Nov 07, 1960.  Kari Miles is being seen in consultation for management of currently uncontrolled symptomatic diabetes requested by  Rosita Fire, MD.   Past Medical History:  Diagnosis Date  . Arthritis   . Carpal tunnel syndrome   . Chronic back pain   . Diabetes mellitus without complication ([place])   . Hypertension   . Tendonitis    Past Surgical History:  Procedure Laterality Date  . ABDOMINAL HYSTERECTOMY    . CESAREAN SECTION    . ORTHOPEDIC SURGERY     Social History   Socioeconomic History  . Marital status: Widowed    Spouse name: None  . Number of children: None  . Years of education: None  . Highest education level: None  Social Needs  . Financial resource strain: None  . Food insecurity - worry: None  . Food insecurity - inability: None  . Transportation needs - medical: None  . Transportation needs - non-medical: None  Occupational History  . None  Tobacco Use  . Smoking status: Current Every Day Smoker    Types: Cigarettes  . Smokeless tobacco: Never Used  Substance and Sexual Activity  . Alcohol use: No  . Drug use: No  . Sexual activity: None  Other Topics Concern  . None  Social History Narrative  . None   Outpatient Encounter Medications as of 08/17/2017  Medication Sig  . amLODipine (NORVASC) 10 MG tablet Take 10 mg by mouth daily.  . Blood Glucose Monitoring Suppl (ACCU-CHEK GUIDE) w/Device KIT 1 Piece by Does not apply route as directed. Test BG 4 x daily. E11.65  . glucose blood test strip 1 each by Other route 4 (four) times daily. Use as instructed 4 x daily. E11.65 True Track  . hydrochlorothiazide (HYDRODIURIL) 25 MG tablet Take 25 mg by mouth daily.  . Insulin Glargine (LANTUS SOLOSTAR) 100 UNIT/ML Solostar Pen Inject 70 Units into the skin at bedtime.   . insulin lispro (HUMALOG KWIKPEN) 100 UNIT/ML KiwkPen Inject 0.15-0.21 mLs (15-21 Units total) into the skin 3 (three) times daily before meals.  Marland Kitchen lisinopril (PRINIVIL,ZESTRIL) 40 MG tablet Take 40 mg by mouth daily.  . metFORMIN (GLUCOPHAGE) 500 MG tablet Take 1 tablet (500 mg total) by mouth 2 (two) times daily with a meal.  . naproxen (NAPROSYN) 500 MG tablet Take 1 tablet (500 mg total) by mouth 2 (two) times daily with a meal.  . omeprazole (PRILOSEC) 20 MG capsule Take 20 mg by mouth daily.  . [DISCONTINUED] insulin lispro (HUMALOG KWIKPEN) 100 UNIT/ML KiwkPen Inject 0.1-0.16 mLs (10-16 Units total) into the skin 3 (three) times daily before meals.  . [DISCONTINUED] metFORMIN (GLUCOPHAGE) 500 MG tablet Take 1 tablet (500 mg total) by mouth 2 (two) times daily with a meal.   No facility-administered encounter medications on file as of 08/17/2017.     ALLERGIES: No Known Allergies  VACCINATION STATUS:  There is no immunization history on file for this patient.  Diabetes  She presents  for her follow-up diabetic visit. She has type 2 diabetes mellitus. Onset time: she was diagnosed at approximate age of 34 years. Her disease course has been worsening. There are no hypoglycemic associated symptoms. Pertinent negatives for hypoglycemia include no confusion, headaches, pallor or seizures. Associated symptoms include blurred vision, fatigue, polydipsia and polyuria. Pertinent negatives for diabetes include no chest pain and no polyphagia. There are no hypoglycemic complications. Symptoms are worsening. There are no diabetic complications. Risk factors for coronary artery disease include diabetes mellitus, dyslipidemia, family history, hypertension, obesity, sedentary lifestyle and tobacco exposure. Current diabetic treatment includes insulin injections (She takes Lantus 70 units nightly, Humalog 20 units 3 times a day before meals). Her weight is stable. She is following a generally unhealthy  diet. When asked about meal planning, she reported none. She has not had a previous visit with a dietitian. She never participates in exercise. Her breakfast blood glucose range is generally >200 mg/dl. Her lunch blood glucose range is generally >200 mg/dl. Her dinner blood glucose range is generally >200 mg/dl. Her bedtime blood glucose range is generally >200 mg/dl. Her overall blood glucose range is >200 mg/dl. ( Her most recent A1c was 12% on 06/16/2017. ) An ACE inhibitor/angiotensin II receptor blocker is being taken. She does not see a podiatrist.Eye exam is not current.  Hypertension  This is a chronic problem. The current episode started more than 1 year ago. The problem is uncontrolled. Associated symptoms include blurred vision. Pertinent negatives include no chest pain, headaches, palpitations or shortness of breath. Risk factors for coronary artery disease include diabetes mellitus, obesity, sedentary lifestyle, smoking/tobacco exposure and family history. Past treatments include ACE inhibitors. Compliance problems include psychosocial issues and diet.       Review of Systems  Constitutional: Positive for fatigue. Negative for chills, fever and unexpected weight change.  HENT: Negative for trouble swallowing and voice change.   Eyes: Positive for blurred vision. Negative for visual disturbance.  Respiratory: Negative for cough, shortness of breath and wheezing.   Cardiovascular: Negative for chest pain, palpitations and leg swelling.  Gastrointestinal: Negative for diarrhea, nausea and vomiting.  Endocrine: Positive for polydipsia and polyuria. Negative for cold intolerance, heat intolerance and polyphagia.  Musculoskeletal: Negative for arthralgias and myalgias.  Skin: Negative for color change, pallor, rash and wound.  Neurological: Negative for seizures and headaches.  Psychiatric/Behavioral: Negative for confusion and suicidal ideas.    Objective:    BP (!) 145/80   Pulse 93    Ht '5\' 4"'  (1.626 m)   Wt 188 lb (85.3 kg)   BMI 32.27 kg/m   Wt Readings from Last 3 Encounters:  08/17/17 188 lb (85.3 kg)  07/23/17 188 lb (85.3 kg)  07/02/16 186 lb (84.4 kg)     Physical Exam  Constitutional: She is oriented to person, place, and time. She appears well-developed.  HENT:  Head: Normocephalic and atraumatic.  Eyes: EOM are normal.  Neck: Normal range of motion. Neck supple. No tracheal deviation present. No thyromegaly present.  Cardiovascular: Normal rate and regular rhythm.  Pulmonary/Chest: Effort normal and breath sounds normal.  Abdominal: Soft. Bowel sounds are normal. There is no tenderness. There is no guarding.  Musculoskeletal: Normal range of motion. She exhibits no edema.  Neurological: She is alert and oriented to person, place, and time. She has normal reflexes. No cranial nerve deficit. Coordination normal.  Skin: Skin is warm and dry. No rash noted. No erythema. No pallor.  Psychiatric: She has a normal mood  and affect. Judgment normal.  Patient has a reluctant, unconcerned affect.    CMP ( most recent) CMP     Component Value Date/Time   NA 133 (L) 04/28/2013 1236   K 3.9 04/28/2013 1236   CL 92 (L) 04/28/2013 1236   CO2 28 04/28/2013 1236   GLUCOSE 363 (H) 04/28/2013 1236   BUN 13 06/16/2017   CREATININE 0.6 06/16/2017   CREATININE 0.40 (L) 04/28/2013 1236   CALCIUM 10.3 04/28/2013 1236   PROT 8.4 (H) 04/28/2013 1236   ALBUMIN 4.6 04/28/2013 1236   AST 60 (H) 04/28/2013 1236   ALT 101 (H) 04/28/2013 1236   ALKPHOS 77 04/28/2013 1236   BILITOT 0.8 04/28/2013 1236   GFRNONAA >90 04/28/2013 1236   GFRAA >90 04/28/2013 1236     Diabetic Labs (most recent): Lab Results  Component Value Date   HGBA1C 12 06/16/2017     Lipid Panel ( most recent) Lipid Panel     Component Value Date/Time   CHOL 160 06/16/2017   TRIG 60 06/16/2017   HDL 41 06/16/2017   LDLCALC 104 06/16/2017      Assessment & Plan:   1. Uncontrolled  type 2 diabetes mellitus with hyperglycemia ([place])  - Kari Miles has currently uncontrolled symptomatic type 2 DM since  57 years of age. -She returns with persistently above target blood glucose profile, her recent A1c was 12%.   -She did not bring her meter to verify some of her readings which look suspicious. Recent labs reviewed.  -her diabetes is complicated by noncompliance/nonadherence, chronic heavy smoking and Kari Miles remains at a high risk for more acute and chronic complications which include CAD, CVA, CKD, retinopathy, and neuropathy. These are all discussed in detail with the patient.  - I have counseled her on diet management and weight loss, by adopting a carbohydrate restricted/protein rich diet.  -  Suggestion is made for her to avoid simple carbohydrates  from her diet including Cakes, Sweet Desserts / Pastries, Ice Cream, Soda (diet and regular), Sweet Tea, Candies, Chips, Cookies, Store Bought Juices, Alcohol in Excess of  1-2 drinks a day, Artificial Sweeteners, and "Sugar-free" Products. This will help patient to have stable blood glucose profile and potentially avoid unintended weight gain.   - I encouraged her to switch to  unprocessed or minimally processed complex starch and increased protein intake (animal or plant source), fruits, and vegetables.  - she is advised to stick to a routine mealtimes to eat 3 meals  a day and avoid unnecessary snacks ( to snack only to correct hypoglycemia).   - she will be scheduled with Jearld Fenton, RDN, CDE for individualized diabetes education.  - I have approached her with the following individualized plan to manage diabetes and patient agrees:   - Based on her current glycemic burden with A1c of 12%, she'll continue to require intensive treatment with basal/bolus insulin.  - I  will continue with Lantus 70 units daily at bedtime, and increase Humalog to 15 units 3 times a day with meals  for pre-meal BG readings of  90-143m/dl, plus patient specific correction dose for unexpected hyperglycemia above 1549mdl, associated with strict monitoring of glucose 4 times a day-before meals and at bedtime. - Patient is warned not to take insulin without proper monitoring per orders. -Adjustment parameters are given for hypo and hyperglycemia in writing. -Patient is encouraged to call clinic for blood glucose levels less than 70 or above 300 mg /dl. - Her  renal function is normal and does not recall any adverse effects from metformin treatment. -I advised her to continue low-dose metformin 500 mg p.o. twice daily after meals.  - she will be considered for incretin therapy as appropriate next visit. - Patient specific target  A1c;  LDL, HDL, Triglycerides, and  Waist Circumference were discussed in detail.  2) BP/HTN: Her blood pressure is not controlled to target, today at 145/80. I have advised her to continue hydrocortisone 25 mg by mouth daily, lisinopril 40 mg by mouth daily, amlodipine 10 mg by mouth daily.   3) Lipids/HPL:   Uncontrolled with LDL of 104, she is not on statins. She will be considered for low-dose statin by next visit.  4)  Weight/Diet: CDE Consult will be initiated , exercise, and detailed carbohydrates information provided.  5) Chronic Care/Health Maintenance:  -she  is on ACEI medications and  is encouraged to continue to follow up with Ophthalmology, Dentist,  Podiatrist at least yearly or according to recommendations, and advised to  quit smoking. I have recommended yearly flu vaccine and pneumonia vaccination at least every 5 years; moderate intensity exercise for up to 150 minutes weekly; and  sleep for at least 7 hours a day.  - I advised patient to maintain close follow up with Rosita Fire, MD for primary care needs.  - Time spent with the patient: 25 min, of which >50% was spent in reviewing her blood glucose logs , discussing her hypo- and hyper-glycemic episodes, reviewing her  current and  previous labs and insulin doses and developing a plan to avoid hypo- and hyper-glycemia. Please refer to Patient Instructions for Blood Glucose Monitoring and Insulin/Medications Dosing Guide"  in media tab for additional information.   Follow up plan: - Return in about 6 weeks (around 09/28/2017) for follow up with pre-visit labs, meter, and logs.  Glade Lloyd, MD [facility] [address] [place], [place] [address] Phone: [phone]  Fax: [phone]    08/17/2017, 2:33 PM  This note was partially dictated with voice recognition software. Similar sounding words can be transcribed inadequately or may not  be corrected upon review.

## 2017-08-17 NOTE — Patient Instructions (Signed)

## 2017-08-30 ENCOUNTER — Ambulatory Visit: Payer: Medicare Other | Admitting: Nutrition

## 2017-08-30 DIAGNOSIS — Z809 Family history of malignant neoplasm, unspecified: Secondary | ICD-10-CM | POA: Diagnosis not present

## 2017-08-30 DIAGNOSIS — Z803 Family history of malignant neoplasm of breast: Secondary | ICD-10-CM | POA: Diagnosis not present

## 2017-08-30 DIAGNOSIS — Z1501 Genetic susceptibility to malignant neoplasm of breast: Secondary | ICD-10-CM | POA: Diagnosis not present

## 2017-08-30 DIAGNOSIS — Z808 Family history of malignant neoplasm of other organs or systems: Secondary | ICD-10-CM | POA: Diagnosis not present

## 2017-09-13 DIAGNOSIS — E1140 Type 2 diabetes mellitus with diabetic neuropathy, unspecified: Secondary | ICD-10-CM | POA: Diagnosis not present

## 2017-09-13 DIAGNOSIS — F17200 Nicotine dependence, unspecified, uncomplicated: Secondary | ICD-10-CM | POA: Diagnosis not present

## 2017-09-13 DIAGNOSIS — I10 Essential (primary) hypertension: Secondary | ICD-10-CM | POA: Diagnosis not present

## 2017-09-13 DIAGNOSIS — M170 Bilateral primary osteoarthritis of knee: Secondary | ICD-10-CM | POA: Diagnosis not present

## 2017-09-13 DIAGNOSIS — E1165 Type 2 diabetes mellitus with hyperglycemia: Secondary | ICD-10-CM | POA: Diagnosis not present

## 2017-09-22 DIAGNOSIS — E1165 Type 2 diabetes mellitus with hyperglycemia: Secondary | ICD-10-CM | POA: Diagnosis not present

## 2017-09-23 ENCOUNTER — Ambulatory Visit: Payer: Medicare Other | Admitting: Nutrition

## 2017-09-23 LAB — COMPLETE METABOLIC PANEL WITH GFR
AG Ratio: 1.7 (calc) (ref 1.0–2.5)
ALT: 48 U/L — ABNORMAL HIGH (ref 6–29)
AST: 27 U/L (ref 10–35)
Albumin: 4.5 g/dL (ref 3.6–5.1)
Alkaline phosphatase (APISO): 55 U/L (ref 33–130)
BUN: 8 mg/dL (ref 7–25)
CO2: 32 mmol/L (ref 20–32)
Calcium: 9.7 mg/dL (ref 8.6–10.4)
Chloride: 99 mmol/L (ref 98–110)
Creat: 0.56 mg/dL (ref 0.50–1.05)
GFR, Est African American: 120 mL/min/{1.73_m2} (ref >=60)
GFR, Est Non African American: 104 mL/min/{1.73_m2} (ref >=60)
Globulin: 2.6 g/dL (calc) (ref 1.9–3.7)
Glucose, Bld: 225 mg/dL — ABNORMAL HIGH (ref 65–99)
Potassium: 3.9 mmol/L (ref 3.5–5.3)
Sodium: 136 mmol/L (ref 135–146)
Total Bilirubin: 0.6 mg/dL (ref 0.2–1.2)
Total Protein: 7.1 g/dL (ref 6.1–8.1)

## 2017-09-23 LAB — HEMOGLOBIN A1C
Hgb A1c MFr Bld: 10.9 % of total Hgb — ABNORMAL HIGH (ref <5.7)
Mean Plasma Glucose: 266 (calc)
eAG (mmol/L): 14.7 (calc)

## 2017-09-29 ENCOUNTER — Ambulatory Visit: Payer: Medicare Other | Admitting: "Endocrinology

## 2017-10-01 ENCOUNTER — Ambulatory Visit (INDEPENDENT_AMBULATORY_CARE_PROVIDER_SITE_OTHER): Payer: Medicare Other | Admitting: "Endocrinology

## 2017-10-01 ENCOUNTER — Encounter: Payer: Self-pay | Admitting: "Endocrinology

## 2017-10-01 VITALS — BP 139/85 | HR 96 | Ht 64.0 in | Wt 186.0 lb

## 2017-10-01 DIAGNOSIS — E6609 Other obesity due to excess calories: Secondary | ICD-10-CM | POA: Diagnosis not present

## 2017-10-01 DIAGNOSIS — E1165 Type 2 diabetes mellitus with hyperglycemia: Secondary | ICD-10-CM | POA: Diagnosis not present

## 2017-10-01 DIAGNOSIS — I10 Essential (primary) hypertension: Secondary | ICD-10-CM

## 2017-10-01 DIAGNOSIS — Z6832 Body mass index (BMI) 32.0-32.9, adult: Secondary | ICD-10-CM | POA: Diagnosis not present

## 2017-10-01 MED ORDER — INSULIN GLARGINE 100 UNIT/ML SOLOSTAR PEN: 80.0000 [IU] | PEN_INJECTOR | Freq: Every day | SUBCUTANEOUS | 2 refills | Status: DC

## 2017-10-01 MED ORDER — ATORVASTATIN CALCIUM 20 MG PO TABS: 20.0000 mg | ORAL_TABLET | Freq: Every day | ORAL | 2 refills | Status: DC

## 2017-10-01 NOTE — Progress Notes (Signed)
Consult Note       10/01/2017, 12:10 PM   Subjective:    Patient ID: Kari Miles, female    DOB: [birth year].  [name] is being seen in consultation for management of currently uncontrolled symptomatic diabetes requested by  Rosita Fire, MD.   Past Medical History:  Diagnosis Date  . Arthritis   . Carpal tunnel syndrome   . Chronic back pain   . Diabetes mellitus without complication ([place])   . Hypertension   . Tendonitis    Past Surgical History:  Procedure Laterality Date  . ABDOMINAL HYSTERECTOMY    . CESAREAN SECTION    . ORTHOPEDIC SURGERY     Social History   Socioeconomic History  . Marital status: Widowed    Spouse name: Not on file  . Number of children: Not on file  . Years of education: Not on file  . Highest education level: Not on file  Occupational History  . Not on file  Social Needs  . Financial resource strain: Not on file  . Food insecurity:    Worry: Not on file    Inability: Not on file  . Transportation needs:    Medical: Not on file    Non-medical: Not on file  Tobacco Use  . Smoking status: Current Every Day Smoker    Types: Cigarettes  . Smokeless tobacco: Never Used  Substance and Sexual Activity  . Alcohol use: No  . Drug use: No  . Sexual activity: Not on file  Lifestyle  . Physical activity:    Days per week: Not on file    Minutes per session: Not on file  . Stress: Not on file  Relationships  . Social connections:    Talks on phone: Not on file    Gets together: Not on file    Attends religious service: Not on file    Active member of club or organization: Not on file    Attends meetings of clubs or organizations: Not on file    Relationship status: Not on file  Other Topics Concern  . Not on file  Social History Narrative  . Not on file   Outpatient Encounter Medications as of 10/01/2017  Medication Sig  . amLODipine (NORVASC)  10 MG tablet Take 10 mg by mouth daily.  . Blood Glucose Monitoring Suppl (ACCU-CHEK GUIDE) w/Device KIT 1 Piece by Does not apply route as directed. Test BG 4 x daily. E11.65  . glucose blood test strip 1 each by Other route 4 (four) times daily. Use as instructed 4 x daily. E11.65 True Track  . hydrochlorothiazide (HYDRODIURIL) 25 MG tablet Take 25 mg by mouth daily.  . Insulin Glargine (LANTUS SOLOSTAR) 100 UNIT/ML Solostar Pen Inject 80 Units into the skin at bedtime.  . insulin lispro (HUMALOG KWIKPEN) 100 UNIT/ML KiwkPen Inject 0.15-0.21 mLs (15-21 Units total) into the skin 3 (three) times daily before meals.  Marland Kitchen lisinopril (PRINIVIL,ZESTRIL) 40 MG tablet Take 40 mg by mouth daily.  . metFORMIN (GLUCOPHAGE) 500 MG tablet Take 1 tablet (500 mg total) by mouth 2 (two) times daily with a meal.  .  naproxen (NAPROSYN) 500 MG tablet Take 1 tablet (500 mg total) by mouth 2 (two) times daily with a meal.  . omeprazole (PRILOSEC) 20 MG capsule Take 20 mg by mouth daily.  . [DISCONTINUED] Insulin Glargine (LANTUS SOLOSTAR) 100 UNIT/ML Solostar Pen Inject 70 Units into the skin at bedtime.   No facility-administered encounter medications on file as of 10/01/2017.     ALLERGIES: No Known Allergies  VACCINATION STATUS:  There is no immunization history on file for this patient.  Diabetes  She presents for her follow-up diabetic visit. She has type 2 diabetes mellitus. Onset time: she was diagnosed at approximate age of 38 years. Her disease course has been improving. There are no hypoglycemic associated symptoms. Pertinent negatives for hypoglycemia include no confusion, headaches, pallor or seizures. Associated symptoms include blurred vision, fatigue, polydipsia and polyuria. Pertinent negatives for diabetes include no chest pain and no polyphagia. There are no hypoglycemic complications. Symptoms are improving. There are no diabetic complications. Risk factors for coronary artery disease include  diabetes mellitus, dyslipidemia, family history, hypertension, obesity, sedentary lifestyle and tobacco exposure. Current diabetic treatment includes insulin injections (She takes Lantus 70 units nightly, Humalog 15 units 3 times a day before meals). Her weight is stable. She is following a generally unhealthy diet. When asked about meal planning, she reported none. She has not had a previous visit with a dietitian. She never participates in exercise. Her breakfast blood glucose range is generally >200 mg/dl. Her lunch blood glucose range is generally >200 mg/dl. Her dinner blood glucose range is generally >200 mg/dl. Her bedtime blood glucose range is generally >200 mg/dl. Her overall blood glucose range is >200 mg/dl. ( Still presents with persistent hyperglycemia above 200 mg/dL, her A1c showed improvement to 10.9% from 12%. ) An ACE inhibitor/angiotensin II receptor blocker is being taken. She does not see a podiatrist.Eye exam is not current.  Hypertension  This is a chronic problem. The current episode started more than 1 year ago. The problem is uncontrolled. Associated symptoms include blurred vision. Pertinent negatives include no chest pain, headaches, palpitations or shortness of breath. Risk factors for coronary artery disease include diabetes mellitus, obesity, sedentary lifestyle, smoking/tobacco exposure and family history. Past treatments include ACE inhibitors. Compliance problems include psychosocial issues and diet.      Review of Systems  Constitutional: Positive for fatigue. Negative for chills, fever and unexpected weight change.  HENT: Negative for trouble swallowing and voice change.   Eyes: Positive for blurred vision. Negative for visual disturbance.  Respiratory: Negative for cough, shortness of breath and wheezing.   Cardiovascular: Negative for chest pain, palpitations and leg swelling.  Gastrointestinal: Negative for diarrhea, nausea and vomiting.  Endocrine: Positive for  polydipsia and polyuria. Negative for cold intolerance, heat intolerance and polyphagia.  Musculoskeletal: Negative for arthralgias and myalgias.  Skin: Negative for color change, pallor, rash and wound.  Neurological: Negative for seizures and headaches.  Psychiatric/Behavioral: Negative for confusion and suicidal ideas.    Objective:    BP 139/85   Pulse 96   Ht '5\' 4"'  (1.626 m)   Wt 186 lb (84.4 kg)   BMI 31.93 kg/m   Wt Readings from Last 3 Encounters:  10/01/17 186 lb (84.4 kg)  08/17/17 188 lb (85.3 kg)  07/23/17 188 lb (85.3 kg)     Physical Exam  Constitutional: She is oriented to person, place, and time. She appears well-developed.  HENT:  Head: Normocephalic and atraumatic.  Eyes: EOM are normal.  Neck:  Normal range of motion. Neck supple. No tracheal deviation present. No thyromegaly present.  Cardiovascular: Normal rate.  Pulmonary/Chest: Effort normal.  Abdominal: There is no tenderness. There is no guarding.  Musculoskeletal: Normal range of motion. She exhibits no edema.  Neurological: She is alert and oriented to person, place, and time. She has normal reflexes. No cranial nerve deficit. Coordination normal.  Skin: Skin is warm and dry. No rash noted. No erythema. No pallor.  Psychiatric: She has a normal mood and affect. Judgment normal.  Patient has a reluctant, unconcerned affect.    CMP ( most recent) CMP     Component Value Date/Time   NA 136 09/22/2017 1130   K 3.9 09/22/2017 1130   CL 99 09/22/2017 1130   CO2 32 09/22/2017 1130   GLUCOSE 225 (H) 09/22/2017 1130   BUN 8 09/22/2017 1130   BUN 13 06/16/2017   CREATININE 0.56 09/22/2017 1130   CALCIUM 9.7 09/22/2017 1130   PROT 7.1 09/22/2017 1130   ALBUMIN 4.6 04/28/2013 1236   AST 27 09/22/2017 1130   ALT 48 (H) 09/22/2017 1130   ALKPHOS 77 04/28/2013 1236   BILITOT 0.6 09/22/2017 1130   GFRNONAA 104 09/22/2017 1130   GFRAA 120 09/22/2017 1130    Diabetic Labs (most recent): Lab Results   Component Value Date   HGBA1C 10.9 (H) 09/22/2017   HGBA1C 12 06/16/2017     Lipid Panel ( most recent) Lipid Panel     Component Value Date/Time   CHOL 160 06/16/2017   TRIG 60 06/16/2017   HDL 41 06/16/2017   LDLCALC 104 06/16/2017      Assessment & Plan:   1. Uncontrolled type 2 diabetes mellitus with hyperglycemia ([place])  - [name] has currently uncontrolled symptomatic type 2 DM since  56 years of age. -She returns with persistently above target blood glucose profile, significantly better than last visit, her recent A1c improved to 10.9% from 12%.   -She did not bring her meter to verify some of her readings which look suspicious. Recent labs reviewed.  -her diabetes is complicated by noncompliance/nonadherence, chronic heavy smoking and MARLICIA SROKA remains at a high risk for more acute and chronic complications which include CAD, CVA, CKD, retinopathy, and neuropathy. These are all discussed in detail with the patient.  - I have counseled her on diet management and weight loss, by adopting a carbohydrate restricted/protein rich diet. -  Suggestion is made for her to avoid simple carbohydrates  from her diet including Cakes, Sweet Desserts / Pastries, Ice Cream, Soda (diet and regular), Sweet Tea, Candies, Chips, Cookies, Store Bought Juices, Alcohol in Excess of  1-2 drinks a day, Artificial Sweeteners, and "Sugar-free" Products. This will help patient to have stable blood glucose profile and potentially avoid unintended weight gain.  - I encouraged her to switch to  unprocessed or minimally processed complex starch and increased protein intake (animal or plant source), fruits, and vegetables.  - she is advised to stick to a routine mealtimes to eat 3 meals  a day and avoid unnecessary snacks ( to snack only to correct hypoglycemia).   - she will be scheduled with Jearld Fenton, RDN, CDE for individualized diabetes education.  - I have approached her with the  following individualized plan to manage diabetes and patient agrees:   -She met Humalog dosing error, kept it at 15 units regardless of her blood glucose readings.   -Discussed the proper insulin dosing regimen with her.   -  Based on her current glycemic burden with A1c of 10.9%, she'll continue to require intensive treatment with basal/bolus insulin.  - I Qaiser to increase Lantus to 80 units nightly, continue Humalog  15 units 3 times a day with meals  for pre-meal BG readings of 90-154m/dl, plus patient specific correction dose for unexpected hyperglycemia above 1552mdl, associated with strict monitoring of glucose 4 times a day-before meals and at bedtime. - Patient is warned not to take insulin without proper monitoring per orders. -Adjustment parameters are given for hypo and hyperglycemia in writing. -Patient is encouraged to call clinic for blood glucose levels less than 70 or above 300 mg /dl. - Her renal function is normal and does not recall any adverse effects from metformin treatment. -I advised her to continue low-dose metformin 500 mg p.o. twice daily after meals.  - she will be considered for incretin therapy as appropriate next visit. - Patient specific target  A1c;  LDL, HDL, Triglycerides, and  Waist Circumference were discussed in detail.  2) BP/HTN: Her blood pressure has improved and controlled to target.    I have advised her to continue hydrocortisone 25 mg by mouth daily, lisinopril 40 mg by mouth daily, amlodipine 10 mg by mouth daily.   3) Lipids/HPL:   Uncontrolled with LDL of 104, she is not on statins.  I will initiate low-dose statin with atorvastatin 20 mg p.o. Nightly.  4)  Weight/Diet: CDE Consult will be initiated , exercise, and detailed carbohydrates information provided.  5) Chronic Care/Health Maintenance:  -she  is on ACEI medications and  is encouraged to continue to follow up with Ophthalmology, Dentist,  Podiatrist at least yearly or according to  recommendations, and advised to  quit smoking. I have recommended yearly flu vaccine and pneumonia vaccination at least every 5 years; moderate intensity exercise for up to 150 minutes weekly; and  sleep for at least 7 hours a day.  - I advised patient to maintain close follow up with FaRosita FireMD for primary care needs. - Time spent with the patient: 25 min, of which >50% was spent in reviewing her blood glucose logs , discussing her hypo- and hyper-glycemic episodes, reviewing her current and  previous labs and insulin doses and developing a plan to avoid hypo- and hyper-glycemia. Please refer to Patient Instructions for Blood Glucose Monitoring and Insulin/Medications Dosing Guide"  in media tab for additional information. CaMaudry Diegoarticipated in the discussions, expressed understanding, and voiced agreement with the above plans.  All questions were answered to her satisfaction. she is encouraged to contact clinic should she have any questions or concerns prior to her return visit. Follow up plan: Return in 3 months with repeat labs, meter and logs. GeGlade LloydMD CoProvidence [facility] Centerroup ReWestern Maryland Center[address] [address]hone: [phone]: [phone]  10/01/2017, 12:10 PM  This note was partially dictated with voice recognition software. Similar sounding words can be transcribed inadequately or may not  be corrected upon review.

## 2017-10-01 NOTE — Patient Instructions (Signed)

## 2017-10-26 ENCOUNTER — Ambulatory Visit: Payer: Medicare Other | Admitting: Nutrition

## 2017-11-23 ENCOUNTER — Other Ambulatory Visit: Payer: Self-pay | Admitting: "Endocrinology

## 2017-12-29 ENCOUNTER — Ambulatory Visit: Payer: Medicare Other | Admitting: "Endocrinology

## 2017-12-29 ENCOUNTER — Encounter: Payer: Self-pay | Admitting: "Endocrinology

## 2017-12-31 ENCOUNTER — Ambulatory Visit: Payer: Medicare Other | Admitting: "Endocrinology

## 2018-03-21 DIAGNOSIS — M170 Bilateral primary osteoarthritis of knee: Secondary | ICD-10-CM | POA: Diagnosis not present

## 2018-03-21 DIAGNOSIS — I10 Essential (primary) hypertension: Secondary | ICD-10-CM | POA: Diagnosis not present

## 2018-03-21 DIAGNOSIS — E1140 Type 2 diabetes mellitus with diabetic neuropathy, unspecified: Secondary | ICD-10-CM | POA: Diagnosis not present

## 2018-03-21 DIAGNOSIS — J069 Acute upper respiratory infection, unspecified: Secondary | ICD-10-CM | POA: Diagnosis not present

## 2018-03-21 DIAGNOSIS — Z23 Encounter for immunization: Secondary | ICD-10-CM | POA: Diagnosis not present

## 2018-03-23 DIAGNOSIS — Z794 Long term (current) use of insulin: Secondary | ICD-10-CM | POA: Diagnosis not present

## 2018-03-23 DIAGNOSIS — S92514A Nondisplaced fracture of proximal phalanx of right lesser toe(s), initial encounter for closed fracture: Secondary | ICD-10-CM | POA: Diagnosis not present

## 2018-03-23 DIAGNOSIS — M1990 Unspecified osteoarthritis, unspecified site: Secondary | ICD-10-CM | POA: Diagnosis not present

## 2018-03-23 DIAGNOSIS — W228XXA Striking against or struck by other objects, initial encounter: Secondary | ICD-10-CM | POA: Diagnosis not present

## 2018-03-23 DIAGNOSIS — E119 Type 2 diabetes mellitus without complications: Secondary | ICD-10-CM | POA: Diagnosis not present

## 2018-03-23 DIAGNOSIS — Z79899 Other long term (current) drug therapy: Secondary | ICD-10-CM | POA: Diagnosis not present

## 2018-03-23 DIAGNOSIS — S92501A Displaced unspecified fracture of right lesser toe(s), initial encounter for closed fracture: Secondary | ICD-10-CM | POA: Diagnosis not present

## 2018-03-23 DIAGNOSIS — F17200 Nicotine dependence, unspecified, uncomplicated: Secondary | ICD-10-CM | POA: Diagnosis not present

## 2018-03-23 DIAGNOSIS — I10 Essential (primary) hypertension: Secondary | ICD-10-CM | POA: Diagnosis not present

## 2018-06-20 DIAGNOSIS — Z0000 Encounter for general adult medical examination without abnormal findings: Secondary | ICD-10-CM | POA: Diagnosis not present

## 2018-06-20 DIAGNOSIS — E785 Hyperlipidemia, unspecified: Secondary | ICD-10-CM | POA: Diagnosis not present

## 2018-06-20 DIAGNOSIS — Z1389 Encounter for screening for other disorder: Secondary | ICD-10-CM | POA: Diagnosis not present

## 2018-06-20 DIAGNOSIS — I10 Essential (primary) hypertension: Secondary | ICD-10-CM | POA: Diagnosis not present

## 2018-06-20 DIAGNOSIS — J410 Simple chronic bronchitis: Secondary | ICD-10-CM | POA: Diagnosis not present

## 2018-06-20 DIAGNOSIS — Z1331 Encounter for screening for depression: Secondary | ICD-10-CM | POA: Diagnosis not present

## 2018-06-20 DIAGNOSIS — E1140 Type 2 diabetes mellitus with diabetic neuropathy, unspecified: Secondary | ICD-10-CM | POA: Diagnosis not present

## 2018-06-20 DIAGNOSIS — F17200 Nicotine dependence, unspecified, uncomplicated: Secondary | ICD-10-CM | POA: Diagnosis not present

## 2018-06-20 DIAGNOSIS — E119 Type 2 diabetes mellitus without complications: Secondary | ICD-10-CM | POA: Diagnosis not present

## 2018-06-23 DIAGNOSIS — E1165 Type 2 diabetes mellitus with hyperglycemia: Secondary | ICD-10-CM | POA: Diagnosis not present

## 2018-06-23 DIAGNOSIS — N182 Chronic kidney disease, stage 2 (mild): Secondary | ICD-10-CM | POA: Diagnosis not present

## 2018-07-04 ENCOUNTER — Other Ambulatory Visit: Payer: Self-pay | Admitting: "Endocrinology

## 2018-07-04 ENCOUNTER — Ambulatory Visit: Payer: Medicare Other | Admitting: "Endocrinology

## 2018-07-22 DIAGNOSIS — K047 Periapical abscess without sinus: Secondary | ICD-10-CM | POA: Diagnosis not present

## 2018-07-22 DIAGNOSIS — I10 Essential (primary) hypertension: Secondary | ICD-10-CM | POA: Diagnosis not present

## 2018-07-22 DIAGNOSIS — Z833 Family history of diabetes mellitus: Secondary | ICD-10-CM | POA: Diagnosis not present

## 2018-07-22 DIAGNOSIS — Z8249 Family history of ischemic heart disease and other diseases of the circulatory system: Secondary | ICD-10-CM | POA: Diagnosis not present

## 2018-07-22 DIAGNOSIS — E1140 Type 2 diabetes mellitus with diabetic neuropathy, unspecified: Secondary | ICD-10-CM | POA: Diagnosis not present

## 2018-07-22 DIAGNOSIS — Z794 Long term (current) use of insulin: Secondary | ICD-10-CM | POA: Diagnosis not present

## 2018-07-22 DIAGNOSIS — F17200 Nicotine dependence, unspecified, uncomplicated: Secondary | ICD-10-CM | POA: Diagnosis not present

## 2018-07-25 DIAGNOSIS — E1165 Type 2 diabetes mellitus with hyperglycemia: Secondary | ICD-10-CM | POA: Diagnosis not present

## 2018-07-25 LAB — BASIC METABOLIC PANEL
BUN: 16 (ref 4–21)
Creatinine: 0.6 (ref 0.5–1.1)

## 2018-07-25 LAB — HEMOGLOBIN A1C: Hemoglobin A1C: 9.9

## 2018-08-01 ENCOUNTER — Ambulatory Visit (INDEPENDENT_AMBULATORY_CARE_PROVIDER_SITE_OTHER): Payer: Medicare Other

## 2018-08-01 DIAGNOSIS — Z1211 Encounter for screening for malignant neoplasm of colon: Secondary | ICD-10-CM | POA: Diagnosis not present

## 2018-08-01 MED ORDER — NA SULFATE-K SULFATE-MG SULF 17.5-3.13-1.6 GM/177ML PO SOLN: 1.0000 | ORAL | 0 refills | Status: DC

## 2018-08-01 NOTE — Progress Notes (Signed)
Gastroenterology Pre-Procedure Review  Request Date:08/01/18 Requesting Physician: Dr.Fanta last tcs in [place] several years ago, (not sure when), no polyps per pt  PATIENT REVIEW QUESTIONS: The patient responded to the following health history questions as indicated:    1. Diabetes Melitis: yes (metformin bid, humalog 25u tid, lantus 80u qhs) 2. Joint replacements in the past 12 months: no 3. Major health problems in the past 3 months: no 4. Has an artificial valve or MVP: no 5. Has a defibrillator: no 6. Has been advised in past to take antibiotics in advance of a procedure like teeth cleaning: no 7. Family history of colon cancer: yes (aunt, cousin and sister)  44. Alcohol Use: no 9. History of sleep apnea: no  10. History of coronary artery or other vascular stents placed within the last 12 months: no 11. History of any prior anesthesia complications: no    MEDICATIONS & ALLERGIES:    Patient reports the following regarding taking any blood thinners:   Plavix? no Aspirin? yes (46m) Coumadin? no Brilinta? no Xarelto? no Eliquis? no Pradaxa? no Savaysa? no Effient? no  Patient confirms/reports the following medications:  Current Outpatient Medications  Medication Sig Dispense Refill  . amLODipine (NORVASC) 10 MG tablet Take 10 mg by mouth daily.    .Marland Kitchenaspirin EC 81 MG tablet Take 81 mg by mouth daily.    .Marland Kitchenatorvastatin (LIPITOR) 20 MG tablet TAKE ONE TABLET BY MOUTH DAILY. 30 tablet 2  . hydrochlorothiazide (HYDRODIURIL) 25 MG tablet Take 25 mg by mouth daily.    . insulin lispro (HUMALOG KWIKPEN) 100 UNIT/ML KiwkPen Inject 0.15-0.21 mLs (15-21 Units total) into the skin 3 (three) times daily. (Patient taking differently: Inject 25 Units into the skin 3 (three) times daily. ) 15 mL 2  . LANTUS SOLOSTAR 100 UNIT/ML Solostar Pen 80 Units at bedtime.    .Marland Kitchenlisinopril (PRINIVIL,ZESTRIL) 40 MG tablet Take 40 mg by mouth daily.    . metFORMIN (GLUCOPHAGE) 500 MG tablet TAKE ONE  TABLET BY MOUTH TWICE DAILY. TAKE WITH A MEAL. 60 tablet 2  . Blood Glucose Monitoring Suppl (ACCU-CHEK GUIDE) w/Device KIT 1 Piece by Does not apply route as directed. Test BG 4 x daily. E11.65 1 kit 0  . glucose blood test strip 1 each by Other route 4 (four) times daily. Use as instructed 4 x daily. E11.65 True Track 150 each 5  . insulin lispro (HUMALOG KWIKPEN) 100 UNIT/ML KiwkPen Inject 0.15-0.21 mLs (15-21 Units total) into the skin 3 (three) times daily before meals. 5 pen 2   No current facility-administered medications for this visit.     Patient confirms/reports the following allergies:  No Known Allergies  No orders of the defined types were placed in this encounter.   AUTHORIZATION INFORMATION Primary Insurance: medicare  ID #: [id number]-Cert / Auth required: no  SCHEDULE INFORMATION: Procedure has been scheduled as follows:  Date: 09/16/18, Time: 12:00 Location: [facility] Dr[facility]  This Gastroenterology Pre-Precedure Review Form is being routed to the following provider(s): EWalden FieldNP

## 2018-08-01 NOTE — Patient Instructions (Addendum)
Kari Miles  26-Sep-1960 MRN: [id number]     Procedure Date: 06/28/2019 Time to register: 1:00 pm Place to register: [facility] Procedure Time: 2:00 pm Scheduled provider: Barney Drain, MD    PREPARATION FOR COLONOSCOPY WITH SUPREP BOWEL PREP KIT  Note: Suprep Bowel Prep Kit is a split-dose (2day) regimen. Consumption of BOTH 6-ounce bottles is required for a complete prep.  Please notify us immediately if you are diabetic, take iron supplements, or if you are on Coumadin or any other blood thinners.  Please hold the following medications:See diabetic medication protocol below                     1 DAY BEFORE PROCEDURE:  DATE: 06/27/2019  DAY: Tuesday  clear liquids the entire day - NO SOLID FOOD.   Diabetic medications adjustments for today: Humalog: half dose all three doses the day before procedure.  Take Lantus: half night before procedure.  Metformin: half dose day before procedure. Check blood sugars as needed and if you feel like your blood sugar is low, you can use soda, juice (that's in [place]) as needed for any low blood sugar.   At 6:00pm: Complete steps 1 through 4 below, using ONE (1) 6-ounce bottle, before going to bed. Step 1:  Pour ONE (1) 6-ounce bottle of SUPREP liquid into the mixing container.  Step 2:  Add cool drinking water to the 16 ounce line on the container and mix.  Note: Dilute the solution concentrate as directed prior to use. Step 3:  DRINK ALL the liquid in the container. Step 4:  You MUST drink an additional two (2) or more 16 ounce containers of water over the next one (1) hour.   Continue clear liquids.  DAY OF PROCEDURE:   DATE: 06/28/2019   DAY: Wednesday If you take medications for your heart, blood pressure, or breathing, you may take these medications.   Diabetic medications adjustments for today: Do NOT take Humalog, Lantus, or Metformin the morning of procedure.    5 hours before your procedure at : 9:00 am Step 1:   Pour ONE (1) 6-ounce bottle of SUPREP liquid into the mixing container.  Step 2:  Add cool drinking water to the 16 ounce line on the container and mix.  Note: Dilute the solution concentrate as directed prior to use. Step 3:  DRINK ALL the liquid in the container. Step 4:  You MUST drink an additional two (2) or more 16 ounce containers of water over the next one (1) hour. You MUST complete the final glass of water at least 3 hours before your colonoscopy.   Nothing by mouth past:  11:00 am  You may take your morning medications with sip of water unless we have instructed otherwise.    Please see below for Dietary Information.  CLEAR LIQUIDS INCLUDE:  Water Jello (NOT red in color)   Ice Popsicles (NOT red in color)   Tea (sugar ok, no milk/cream) Powdered fruit flavored drinks  Coffee (sugar ok, no milk/cream) Gatorade/ Lemonade/ Kool-Aid  (NOT red in color)   Juice: apple, white grape, white cranberry Soft drinks  Clear bullion, consomme, broth (fat free beef/chicken/vegetable)  Carbonated beverages (any kind)  Strained chicken noodle soup Hard Candy   Remember: Clear liquids are liquids that will allow you to see your fingers on the other side of a clear glass. Be sure liquids are NOT red in color, and not cloudy, but CLEAR.  DO NOT EAT OR DRINK ANY OF THE FOLLOWING:  Dairy products of any kind   Cranberry juice Tomato juice / V8 juice   Grapefruit juice Orange juice     Red grape juice  Do not eat any solid foods, including such foods as: cereal, oatmeal, yogurt, fruits, vegetables, creamed soups, eggs, bread, crackers, pureed foods in a blender, etc.   HELPFUL HINTS FOR DRINKING PREP SOLUTION:   Make sure prep is extremely cold. Mix and refrigerate the the morning of the prep. You may also put in the freezer.   You may try mixing some Crystal Light or Country Time Lemonade if you prefer. Mix in small amounts; add more if necessary.  Try drinking through a straw  Rinse  mouth with water or a mouthwash between glasses, to remove after-taste.  Try sipping on a cold beverage /ice/ popsicles between glasses of prep.  Place a piece of sugar-free hard candy in mouth between glasses.  If you become nauseated, try consuming smaller amounts, or stretch out the time between glasses. Stop for 30-60 minutes, then slowly start back drinking.     OTHER INSTRUCTIONS  You will need a responsible adult at least 59 years of age to accompany you and drive you home. This person must remain in the waiting room during your procedure. The hospital will cancel your procedure if you do not have a responsible adult with you.   1. Wear loose fitting clothing that is easily removed. 2. Leave jewelry and other valuables at home.  3. Remove all body piercing jewelry and leave at home. 4. Total time from sign-in until discharge is approximately 2-3 hours. 5. You should go home directly after your procedure and rest. You can resume normal activities the day after your procedure. 6. The day of your procedure you should not:  Drive  Make legal decisions  Operate machinery  Drink alcohol  Return to work   You may call the office (Dept: [phone]) before 5:00pm, or page the doctor on call [phone]) after 5:00pm, for further instructions, if necessary.   Insurance Information YOU WILL NEED TO CHECK WITH YOUR INSURANCE COMPANY FOR THE BENEFITS OF COVERAGE YOU HAVE FOR THIS PROCEDURE.  UNFORTUNATELY, NOT ALL INSURANCE COMPANIES HAVE BENEFITS TO COVER ALL OR PART OF THESE TYPES OF PROCEDURES.  IT IS YOUR RESPONSIBILITY TO CHECK YOUR BENEFITS, HOWEVER, WE WILL BE GLAD TO ASSIST YOU WITH ANY CODES YOUR INSURANCE COMPANY MAY NEED.    PLEASE NOTE THAT MOST INSURANCE COMPANIES WILL NOT COVER A SCREENING COLONOSCOPY FOR PEOPLE UNDER THE AGE OF 50  IF YOU HAVE BCBS INSURANCE, YOU MAY HAVE BENEFITS FOR A SCREENING COLONOSCOPY BUT IF POLYPS ARE FOUND THE DIAGNOSIS WILL CHANGE AND  THEN YOU MAY HAVE A DEDUCTIBLE THAT WILL NEED TO BE MET. SO PLEASE MAKE SURE YOU CHECK YOUR BENEFITS FOR A SCREENING COLONOSCOPY AS WELL AS A DIAGNOSTIC COLONOSCOPY.

## 2018-08-02 ENCOUNTER — Ambulatory Visit (INDEPENDENT_AMBULATORY_CARE_PROVIDER_SITE_OTHER): Payer: Medicare Other | Admitting: "Endocrinology

## 2018-08-02 ENCOUNTER — Encounter: Payer: Self-pay | Admitting: "Endocrinology

## 2018-08-02 VITALS — BP 128/83 | HR 91 | Ht 64.0 in | Wt 186.0 lb

## 2018-08-02 DIAGNOSIS — Z9119 Patient's noncompliance with other medical treatment and regimen: Secondary | ICD-10-CM

## 2018-08-02 DIAGNOSIS — Z6832 Body mass index (BMI) 32.0-32.9, adult: Secondary | ICD-10-CM

## 2018-08-02 DIAGNOSIS — E1165 Type 2 diabetes mellitus with hyperglycemia: Secondary | ICD-10-CM

## 2018-08-02 DIAGNOSIS — E6609 Other obesity due to excess calories: Secondary | ICD-10-CM | POA: Diagnosis not present

## 2018-08-02 DIAGNOSIS — I10 Essential (primary) hypertension: Secondary | ICD-10-CM

## 2018-08-02 DIAGNOSIS — Z91199 Patient's noncompliance with other medical treatment and regimen due to unspecified reason: Secondary | ICD-10-CM

## 2018-08-02 DIAGNOSIS — E66811 Obesity, class 1: Secondary | ICD-10-CM

## 2018-08-02 NOTE — Progress Notes (Signed)
DM meds: Humalog: half dose all three doses the day before, none the day of procedure. Lantus: half night before. Metformin: half dose day before, none the morning of.  On prep day: Check CBG ac and hs as well as if the patient feels like their blood sugar is off. Can use soda, juice (that's in CLEAR LIQUIDS) as needed for any low blood sugar.  Check CBG on arrival to endo unit.

## 2018-08-02 NOTE — Progress Notes (Signed)
Endocrinology follow-up note       08/02/2018, 1:03 PM   Subjective:    Patient ID: Kari Miles, female    DOB: Jun 10, 1961.  [name] is being seen in follow-up  for management of currently uncontrolled symptomatic diabetes requested by  Rosita Fire, MD.   Past Medical History:  Diagnosis Date  . Arthritis   . Carpal tunnel syndrome   . Chronic back pain   . Diabetes mellitus without complication ([place])   . Hypertension   . Tendonitis    Past Surgical History:  Procedure Laterality Date  . ABDOMINAL HYSTERECTOMY    . CESAREAN SECTION    . ORTHOPEDIC SURGERY     Social History   Socioeconomic History  . Marital status: Widowed    Spouse name: Not on file  . Number of children: Not on file  . Years of education: Not on file  . Highest education level: Not on file  Occupational History  . Not on file  Social Needs  . Financial resource strain: Not on file  . Food insecurity:    Worry: Not on file    Inability: Not on file  . Transportation needs:    Medical: Not on file    Non-medical: Not on file  Tobacco Use  . Smoking status: Current Every Day Smoker    Types: Cigarettes  . Smokeless tobacco: Never Used  Substance and Sexual Activity  . Alcohol use: No  . Drug use: No  . Sexual activity: Not on file  Lifestyle  . Physical activity:    Days per week: Not on file    Minutes per session: Not on file  . Stress: Not on file  Relationships  . Social connections:    Talks on phone: Not on file    Gets together: Not on file    Attends religious service: Not on file    Active member of club or organization: Not on file    Attends meetings of clubs or organizations: Not on file    Relationship status: Not on file  Other Topics Concern  . Not on file  Social History Narrative  . Not on file   Outpatient Encounter Medications as of 08/02/2018  Medication Sig  . insulin  lispro (HUMALOG) 100 UNIT/ML KwikPen Inject 10-16 Units into the skin 3 (three) times daily before meals.  Marland Kitchen amLODipine (NORVASC) 10 MG tablet Take 10 mg by mouth daily.  Marland Kitchen aspirin EC 81 MG tablet Take 81 mg by mouth daily.  Marland Kitchen atorvastatin (LIPITOR) 20 MG tablet TAKE ONE TABLET BY MOUTH DAILY.  Marland Kitchen Blood Glucose Monitoring Suppl (ACCU-CHEK GUIDE) w/Device KIT 1 Piece by Does not apply route as directed. Test BG 4 x daily. E11.65  . glucose blood test strip 1 each by Other route 4 (four) times daily. Use as instructed 4 x daily. E11.65 True Track  . hydrochlorothiazide (HYDRODIURIL) 25 MG tablet Take 25 mg by mouth daily.  Marland Kitchen LANTUS SOLOSTAR 100 UNIT/ML Solostar Pen 80 Units at bedtime.  Marland Kitchen lisinopril (PRINIVIL,ZESTRIL) 40 MG tablet Take 40 mg by mouth daily.  . metFORMIN (GLUCOPHAGE) 500  MG tablet TAKE ONE TABLET BY MOUTH TWICE DAILY. TAKE WITH A MEAL.  . [DISCONTINUED] insulin lispro (HUMALOG KWIKPEN) 100 UNIT/ML KiwkPen Inject 0.15-0.21 mLs (15-21 Units total) into the skin 3 (three) times daily before meals. (Patient taking differently: Inject 25-31 Units into the skin 3 (three) times daily before meals. )  . [DISCONTINUED] insulin lispro (HUMALOG KWIKPEN) 100 UNIT/ML KiwkPen Inject 0.15-0.21 mLs (15-21 Units total) into the skin 3 (three) times daily. (Patient taking differently: Inject 25 Units into the skin 3 (three) times daily. )  . [DISCONTINUED] Na Sulfate-K Sulfate-Mg Sulf (SUPREP BOWEL PREP KIT) 17.5-3.13-1.6 GM/177ML SOLN Take 1 kit by mouth as directed.   No facility-administered encounter medications on file as of 08/02/2018.     ALLERGIES: No Known Allergies  VACCINATION STATUS:  There is no immunization history on file for this patient.  Diabetes  She presents for her follow-up diabetic visit. She has type 2 diabetes mellitus. Onset time: she was diagnosed at approximate age of 58 years. Her disease course has been worsening. There are no hypoglycemic associated symptoms.  Pertinent negatives for hypoglycemia include no confusion, headaches, pallor or seizures. Associated symptoms include blurred vision, fatigue, polydipsia and polyuria. Pertinent negatives for diabetes include no chest pain and no polyphagia. There are no hypoglycemic complications. Symptoms are improving. There are no diabetic complications. Risk factors for coronary artery disease include diabetes mellitus, dyslipidemia, family history, hypertension, obesity, sedentary lifestyle, tobacco exposure and post-menopausal. Current diabetic treatment includes insulin injections (She takes Lantus 70 units nightly, Humalog 15 units 3 times a day before meals). Her weight is fluctuating minimally. She is following a generally unhealthy diet. When asked about meal planning, she reported none. She has not had a previous visit with a dietitian. She never participates in exercise. Her home blood glucose trend is fluctuating minimally. (She missed her appointment since April 2019, returns with still very high A1c of 9.9%.  She did not bring her logs nor meter to review.) An ACE inhibitor/angiotensin II receptor blocker is being taken. She does not see a podiatrist.Eye exam is not current.  Hypertension  This is a chronic problem. The current episode started more than 1 year ago. The problem is uncontrolled. Associated symptoms include blurred vision. Pertinent negatives include no chest pain, headaches, palpitations or shortness of breath. Risk factors for coronary artery disease include diabetes mellitus, obesity, sedentary lifestyle, smoking/tobacco exposure and family history. Past treatments include ACE inhibitors. Compliance problems include psychosocial issues and diet.      Review of Systems  Constitutional: Positive for fatigue. Negative for chills, fever and unexpected weight change.  HENT: Negative for trouble swallowing and voice change.   Eyes: Positive for blurred vision. Negative for visual disturbance.   Respiratory: Negative for cough, shortness of breath and wheezing.   Cardiovascular: Negative for chest pain, palpitations and leg swelling.  Gastrointestinal: Negative for diarrhea, nausea and vomiting.  Endocrine: Positive for polydipsia and polyuria. Negative for cold intolerance, heat intolerance and polyphagia.  Musculoskeletal: Negative for arthralgias and myalgias.  Skin: Negative for color change, pallor, rash and wound.  Neurological: Negative for seizures and headaches.  Psychiatric/Behavioral: Negative for confusion and suicidal ideas.    Objective:    BP 128/83   Pulse 91   Ht _0  (1.626 m)   Wt 186 lb (84.4 kg)   BMI 31.93 kg/m   Wt Readings from Last 3 Encounters:  08/02/18 186 lb (84.4 kg)  10/01/17 186 lb (84.4 kg)  08/17/17 188 lb (85.3  kg)     Physical Exam  Constitutional: She is oriented to person, place, and time. She appears well-developed.  HENT:  Head: Normocephalic and atraumatic.  Eyes: EOM are normal.  Neck: Normal range of motion. Neck supple. No tracheal deviation present. No thyromegaly present.  Cardiovascular: Normal rate.  Pulmonary/Chest: Effort normal.  Abdominal: There is no abdominal tenderness. There is no guarding.  Musculoskeletal: Normal range of motion.        General: No edema.  Neurological: She is alert and oriented to person, place, and time. She has normal reflexes. No cranial nerve deficit. Coordination normal.  Skin: Skin is warm and dry. No rash noted. No erythema. No pallor.  Psychiatric: She has a normal mood and affect. Judgment normal.  Patient has a reluctant, unconcerned affect.    CMP ( most recent) CMP     Component Value Date/Time   NA 136 09/22/2017 1130   K 3.9 09/22/2017 1130   CL 99 09/22/2017 1130   CO2 32 09/22/2017 1130   GLUCOSE 225 (H) 09/22/2017 1130   BUN 16 07/25/2018   CREATININE 0.6 07/25/2018   CREATININE 0.56 09/22/2017 1130   CALCIUM 9.7 09/22/2017 1130   PROT 7.1 09/22/2017 1130    ALBUMIN 4.6 04/28/2013 1236   AST 27 09/22/2017 1130   ALT 48 (H) 09/22/2017 1130   ALKPHOS 77 04/28/2013 1236   BILITOT 0.6 09/22/2017 1130   GFRNONAA 104 09/22/2017 1130   GFRAA 120 09/22/2017 1130    Diabetic Labs (most recent): Lab Results  Component Value Date   HGBA1C 9.9 07/25/2018   HGBA1C 10.9 (H) 09/22/2017   HGBA1C 12 06/16/2017     Lipid Panel ( most recent) Lipid Panel     Component Value Date/Time   CHOL 160 06/16/2017   TRIG 60 06/16/2017   HDL 41 06/16/2017   LDLCALC 104 06/16/2017      Assessment & Plan:   1. Uncontrolled type 2 diabetes mellitus with hyperglycemia ([place])  - [name] has currently uncontrolled symptomatic type 2 DM since  58 years of age. -She returns without any meter nor logs, denies hypoglycemic episodes.  Returns with A1c of 9.9%.   -her diabetes is complicated by noncompliance/nonadherence, chronic heavy smoking and [employer] remains at a high risk for more acute and chronic complications which include CAD, CVA, CKD, retinopathy, and neuropathy. These are all discussed in detail with the patient.  - I have counseled her on diet management and weight loss, by adopting a carbohydrate restricted/protein rich diet.  - Patient admits there is a room for improvement in her diet and drink choices. -  Suggestion is made for her to avoid simple carbohydrates  from her diet including Cakes, Sweet Desserts / Pastries, Ice Cream, Soda (diet and regular), Sweet Tea, Candies, Chips, Cookies, Store Bought Juices, Alcohol in Excess of  1-2 drinks a day, Artificial Sweeteners, and "Sugar-free" Products. This will help patient to have stable blood glucose profile and potentially avoid unintended weight gain.   - I encouraged her to switch to  unprocessed or minimally processed complex starch and increased protein intake (animal or plant source), fruits, and vegetables.  - she is advised to stick to a routine mealtimes to eat 3 meals  a day  and avoid unnecessary snacks ( to snack only to correct hypoglycemia).   - I have approached her with the following individualized plan to manage diabetes and patient agrees:   -Given her current and prevailing glycemic  burden, she will continue to require intensive treatment with basal/bolus insulin probably with higher dose.  However her presentation without any meter nor logs makes it difficult to optimize her insulin doses.  -She is advised to continue Lantus 80 units nightly, lower Humalog to  Humalog  10 units 3 times a day with meals  for pre-meal BG readings of 90-115m/dl, plus patient specific correction dose for unexpected hyperglycemia above 1549mdl, associated with strict monitoring of glucose 4 times a day-before meals and at bedtime. - Patient is warned not to take insulin without proper monitoring per orders. -Adjustment parameters are given for hypo and hyperglycemia in writing. -Patient is encouraged to call clinic for blood glucose levels less than 70 or above 300 mg /dl. -She continues to have normal renal function.  Advised to continue metformin 500 mg p.o. twice daily after breakfast and supper.   - she not suitable candidate for incretin therapy given her chronic heavy smoking.   - Patient specific target  A1c;  LDL, HDL, Triglycerides, and  Waist Circumference were discussed in detail.  2) BP/HTN: Her blood pressure has improved and controlled to target.   -She is advised to continue her current blood pressure medications including hydrochlorothiazide 25 mg by mouth daily, lisinopril 40 mg by mouth daily, amlodipine 10 mg by mouth daily.   3) Lipids/HPL:   Uncontrolled with LDL of 104, she advised to continue atorvastatin 20 mg p.o. nightly.    4)  Weight/Diet: CDE Consult will be initiated , exercise, and detailed carbohydrates information provided.  5) Chronic Care/Health Maintenance:  -she  is on ACEI medications and  is encouraged to continue to follow up with  Ophthalmology, Dentist,  Podiatrist at least yearly or according to recommendations, and advised to  quit smoking. I have recommended yearly flu vaccine and pneumonia vaccination at least every 5 years; moderate intensity exercise for up to 150 minutes weekly; and  sleep for at least 7 hours a day. -She is extensively counseled against smoking.  - I advised patient to maintain close follow up with FaRosita FireMD for primary care needs.   -- Time spent with the patient: 25 min, of which >50% was spent in reviewing her blood glucose logs , discussing her hypoglycemia and hyperglycemia episodes, reviewing her current and  previous labs / studies and medications  doses and developing a plan to avoid hypoglycemia and hyperglycemia. Please refer to Patient Instructions for Blood Glucose Monitoring and Insulin/Medications Dosing Guide"  in media tab for additional information. CaMaudry Diegoarticipated in the discussions, expressed understanding, and voiced agreement with the above plans.  All questions were answered to her satisfaction. she is encouraged to contact clinic should she have any questions or concerns prior to her return visit.   Follow up plan: Return in 3 months with repeat labs, meter and logs. GeGlade LloydMD CoLawn[facility] Heartroup ReCitadel Infirmary[address] [id number]: [phone]: [phone]  08/02/2018, 1:03 PM  This note was partially dictated with voice recognition software. Similar sounding words can be transcribed inadequately or may not  be corrected upon review.

## 2018-08-02 NOTE — Patient Instructions (Signed)

## 2018-08-03 NOTE — Progress Notes (Signed)
Letter mailed to the pt with DM instructions 

## 2018-08-16 ENCOUNTER — Ambulatory Visit: Payer: Medicare Other | Admitting: "Endocrinology

## 2018-08-29 DIAGNOSIS — Z1231 Encounter for screening mammogram for malignant neoplasm of breast: Secondary | ICD-10-CM | POA: Diagnosis not present

## 2018-09-05 ENCOUNTER — Other Ambulatory Visit: Payer: Self-pay | Admitting: "Endocrinology

## 2018-09-05 ENCOUNTER — Ambulatory Visit (INDEPENDENT_AMBULATORY_CARE_PROVIDER_SITE_OTHER): Payer: Medicare Other | Admitting: "Endocrinology

## 2018-09-05 ENCOUNTER — Encounter: Payer: Self-pay | Admitting: "Endocrinology

## 2018-09-05 VITALS — BP 138/87 | HR 103 | Ht 64.0 in | Wt 182.0 lb

## 2018-09-05 DIAGNOSIS — I10 Essential (primary) hypertension: Secondary | ICD-10-CM

## 2018-09-05 DIAGNOSIS — E1165 Type 2 diabetes mellitus with hyperglycemia: Secondary | ICD-10-CM

## 2018-09-05 DIAGNOSIS — E6609 Other obesity due to excess calories: Secondary | ICD-10-CM | POA: Diagnosis not present

## 2018-09-05 DIAGNOSIS — Z9119 Patient's noncompliance with other medical treatment and regimen: Secondary | ICD-10-CM

## 2018-09-05 DIAGNOSIS — Z6832 Body mass index (BMI) 32.0-32.9, adult: Secondary | ICD-10-CM

## 2018-09-05 DIAGNOSIS — Z91199 Patient's noncompliance with other medical treatment and regimen due to unspecified reason: Secondary | ICD-10-CM

## 2018-09-05 NOTE — Patient Instructions (Signed)

## 2018-09-05 NOTE — Progress Notes (Signed)
                                                              Endocrinology follow-up note       09/05/2018, 1:11 PM   Subjective:    Patient ID: Kari Miles, female    DOB: 04/28/1961.  Kari Miles is being seen in follow-up  for management of currently uncontrolled symptomatic diabetes requested by  Fanta, Tesfaye, MD.   Past Medical History:  Diagnosis Date  . Arthritis   . Carpal tunnel syndrome   . Chronic back pain   . Diabetes mellitus without complication (HCC)   . Hypertension   . Tendonitis    Past Surgical History:  Procedure Laterality Date  . ABDOMINAL HYSTERECTOMY    . CESAREAN SECTION    . ORTHOPEDIC SURGERY     Social History   Socioeconomic History  . Marital status: Widowed    Spouse name: Not on file  . Number of children: Not on file  . Years of education: Not on file  . Highest education level: Not on file  Occupational History  . Not on file  Social Needs  . Financial resource strain: Not on file  . Food insecurity:    Worry: Not on file    Inability: Not on file  . Transportation needs:    Medical: Not on file    Non-medical: Not on file  Tobacco Use  . Smoking status: Current Every Day Smoker    Types: Cigarettes  . Smokeless tobacco: Never Used  Substance and Sexual Activity  . Alcohol use: No  . Drug use: No  . Sexual activity: Not on file  Lifestyle  . Physical activity:    Days per week: Not on file    Minutes per session: Not on file  . Stress: Not on file  Relationships  . Social connections:    Talks on phone: Not on file    Gets together: Not on file    Attends religious service: Not on file    Active member of club or organization: Not on file    Attends meetings of clubs or organizations: Not on file    Relationship status: Not on file  Other Topics Concern  . Not on file  Social History Narrative  . Not on file    Family History  Problem Relation Age of Onset  . Seizures Mother   .  Heart disease Father   . Cancer Sister   . Heart attack Sister    Outpatient Encounter Medications as of 09/05/2018  Medication Sig  . amLODipine (NORVASC) 10 MG tablet Take 10 mg by mouth daily.  . aspirin EC 81 MG tablet Take 81 mg by mouth daily.  . atorvastatin (LIPITOR) 20 MG tablet TAKE ONE TABLET BY MOUTH DAILY.  . Blood Glucose Monitoring Suppl (ACCU-CHEK GUIDE) w/Device KIT 1 Piece by Does not apply route as directed. Test BG 4 x daily. E11.65  . glucose blood test strip 1 each by Other route 4 (four) times daily. Use as instructed 4 x daily. E11.65 True Track  . hydrochlorothiazide (HYDRODIURIL) 25 MG tablet Take 25 mg by mouth daily.  . insulin lispro (HUMALOG) 100 UNIT/ML KwikPen Inject 25 Units into the skin 3 (three) times   daily before meals.   . LANTUS SOLOSTAR 100 UNIT/ML Solostar Pen 80 Units at bedtime.  . lisinopril (PRINIVIL,ZESTRIL) 40 MG tablet Take 40 mg by mouth daily.  . metFORMIN (GLUCOPHAGE) 500 MG tablet TAKE ONE TABLET BY MOUTH TWICE DAILY. TAKE WITH A MEAL.   No facility-administered encounter medications on file as of 09/05/2018.     ALLERGIES: No Known Allergies  VACCINATION STATUS:  There is no immunization history on file for this patient.  Diabetes  She presents for her follow-up diabetic visit. She has type 2 diabetes mellitus. Onset time: she was diagnosed at approximate age of 34 years. Her disease course has been worsening. There are no hypoglycemic associated symptoms. Pertinent negatives for hypoglycemia include no confusion, headaches, pallor or seizures. Associated symptoms include blurred vision, fatigue, polydipsia and polyuria. Pertinent negatives for diabetes include no chest pain and no polyphagia. There are no hypoglycemic complications. Symptoms are worsening. There are no diabetic complications. Risk factors for coronary artery disease include diabetes mellitus, dyslipidemia, family history, hypertension, obesity, sedentary lifestyle, tobacco  exposure and post-menopausal. Current diabetic treatment includes insulin injections (She takes Lantus 70 units nightly, Humalog 15 units 3 times a day before meals). Her weight is decreasing steadily. She is following a generally unhealthy diet. When asked about meal planning, she reported none. She has not had a previous visit with a dietitian. She never participates in exercise. Blood glucose monitoring compliance is inadequate. Her home blood glucose trend is increasing steadily. Her overall blood glucose range is >200 mg/dl. (She presents with inadequate blood glucose monitoring.) An ACE inhibitor/angiotensin II receptor blocker is being taken. She does not see a podiatrist.Eye exam is not current.  Hypertension  This is a chronic problem. The current episode started more than 1 year ago. The problem is uncontrolled. Associated symptoms include blurred vision. Pertinent negatives include no chest pain, headaches, palpitations or shortness of breath. Risk factors for coronary artery disease include diabetes mellitus, obesity, sedentary lifestyle, smoking/tobacco exposure and family history. Past treatments include ACE inhibitors. Compliance problems include psychosocial issues and diet.      Review of Systems  Constitutional: Positive for fatigue. Negative for chills, fever and unexpected weight change.  HENT: Negative for trouble swallowing and voice change.   Eyes: Positive for blurred vision. Negative for visual disturbance.  Respiratory: Negative for cough, shortness of breath and wheezing.   Cardiovascular: Negative for chest pain, palpitations and leg swelling.  Gastrointestinal: Negative for diarrhea, nausea and vomiting.  Endocrine: Positive for polydipsia and polyuria. Negative for cold intolerance, heat intolerance and polyphagia.  Musculoskeletal: Negative for arthralgias and myalgias.  Skin: Negative for color change, pallor, rash and wound.  Neurological: Negative for seizures and  headaches.  Psychiatric/Behavioral: Negative for confusion and suicidal ideas.    Objective:    BP 138/87   Pulse (!) 103   Ht 5' 4" (1.626 m)   Wt 182 lb (82.6 kg)   BMI 31.24 kg/m   Wt Readings from Last 3 Encounters:  09/05/18 182 lb (82.6 kg)  08/02/18 186 lb (84.4 kg)  10/01/17 186 lb (84.4 kg)     Physical Exam  Constitutional: She is oriented to person, place, and time. She appears well-developed.  HENT:  Head: Normocephalic and atraumatic.  Eyes: EOM are normal.  Neck: Normal range of motion. Neck supple. No tracheal deviation present. No thyromegaly present.  Cardiovascular: Normal rate.  Pulmonary/Chest: Effort normal.  Abdominal: There is no abdominal tenderness. There is no guarding.  Musculoskeletal: Normal   range of motion.        General: No edema.  Neurological: She is alert and oriented to person, place, and time. She has normal reflexes. No cranial nerve deficit. Coordination normal.  Skin: Skin is warm and dry. No rash noted. No erythema. No pallor.  Psychiatric: She has a normal mood and affect. Judgment normal.  Patient has a reluctant, unconcerned affect.    CMP ( most recent) CMP     Component Value Date/Time   NA 136 09/22/2017 1130   K 3.9 09/22/2017 1130   CL 99 09/22/2017 1130   CO2 32 09/22/2017 1130   GLUCOSE 225 (H) 09/22/2017 1130   BUN 16 07/25/2018   CREATININE 0.6 07/25/2018   CREATININE 0.56 09/22/2017 1130   CALCIUM 9.7 09/22/2017 1130   PROT 7.1 09/22/2017 1130   ALBUMIN 4.6 04/28/2013 1236   AST 27 09/22/2017 1130   ALT 48 (H) 09/22/2017 1130   ALKPHOS 77 04/28/2013 1236   BILITOT 0.6 09/22/2017 1130   GFRNONAA 104 09/22/2017 1130   GFRAA 120 09/22/2017 1130    Diabetic Labs (most recent): Lab Results  Component Value Date   HGBA1C 9.9 07/25/2018   HGBA1C 10.9 (H) 09/22/2017   HGBA1C 12 06/16/2017     Lipid Panel ( most recent) Lipid Panel     Component Value Date/Time   CHOL 160 06/16/2017   TRIG 60  06/16/2017   HDL 41 06/16/2017   LDLCALC 104 06/16/2017      Assessment & Plan:   1. Uncontrolled type 2 diabetes mellitus with hyperglycemia ([place])  - Kari Miles has currently uncontrolled symptomatic type 2 DM since  58 years of age. -She returns with a log with suspicious readings, most of her readings are not in her meter.  Her most recent A1c was 9.9%.    -her diabetes is complicated by noncompliance/nonadherence, chronic heavy smoking and Kari Miles remains at a high risk for more acute and chronic complications which include CAD, CVA, CKD, retinopathy, and neuropathy. These are all discussed in detail with the patient.  - I have counseled her on diet management and weight loss, by adopting a carbohydrate restricted/protein rich diet.  - Patient admits there is a room for improvement in her diet and drink choices. -  Suggestion is made for her to avoid simple carbohydrates  from her diet including Cakes, Sweet Desserts / Pastries, Ice Cream, Soda (diet and regular), Sweet Tea, Candies, Chips, Cookies, Store Bought Juices, Alcohol in Excess of  1-2 drinks a day, Artificial Sweeteners, and "Sugar-free" Products. This will help patient to have stable blood glucose profile and potentially avoid unintended weight gain.  - I encouraged her to switch to  unprocessed or minimally processed complex starch and increased protein intake (animal or plant source), fruits, and vegetables.  - she is advised to stick to a routine mealtimes to eat 3 meals  a day and avoid unnecessary snacks ( to snack only to correct hypoglycemia).   - I have approached her with the following individualized plan to manage diabetes and patient agrees:   -Given her current and prevailing glycemic burden, she will continue to require intensive treatment with basal/bolus insulin probably with higher dose.  However, this patient does not display optimal engagement for proper monitoring for safe use of insulin.   -This will make it difficult to adjust her treatment.  In the interval, she increased her Humalog to 25 units 3 times daily AC plus sliding scale.  -  Meantime, she is advised to continue Lantus 80 units nightly, lower Humalog to 20 units 3 times a day with meals  for pre-meal BG readings of 90-150mg/dl, plus patient specific correction dose for unexpected hyperglycemia above 150mg/dl, associated with strict monitoring of glucose 4 times a day-before meals and at bedtime. - Patient is warned not to take insulin without proper monitoring per orders. -Adjustment parameters are given for hypo and hyperglycemia in writing. -Patient is encouraged to call clinic for blood glucose levels less than 70 or above 300 mg /dl. -She continues to have normal renal function.  She is advised to continue metformin 500 mg p.o. twice daily after breakfast and supper.   - she not a suitable candidate for incretin therapy given her chronic heavy smoking.   - Patient specific target  A1c;  LDL, HDL, Triglycerides, and  Waist Circumference were discussed in detail.  2) BP/HTN: Her blood pressure has improved to target.  She is advised to continue  her current blood pressure medications including hydrochlorothiazide 25 mg by mouth daily, lisinopril 40 mg by mouth daily, amlodipine 10 mg by mouth daily.   3) Lipids/HPL: Her recent lipid panel showed improved LDL at 104.  She is advised to continue atorvastatin 20 mg p.o. nightly.    4)  Weight/Diet: CDE Consult will be initiated , exercise, and detailed carbohydrates information provided.  5) Chronic Care/Health Maintenance:  -she  is on ACEI medications and  is encouraged to continue to follow up with Ophthalmology, Dentist,  Podiatrist at least yearly or according to recommendations, and advised to  quit smoking. I have recommended yearly flu vaccine and pneumonia vaccination at least every 5 years; moderate intensity exercise for up to 150 minutes weekly; and  sleep  for at least 7 hours a day. -She is tentatively counseled for smoking cessation.    - I advised patient to maintain close follow up with Fanta, Tesfaye, MD for primary care needs.  - Time spent with the patient: 25 min, of which >50% was spent in reviewing her blood glucose logs , discussing her hypoglycemia and hyperglycemia episodes, reviewing her current and  previous labs / studies and medications  doses and developing a plan to avoid hypoglycemia and hyperglycemia. Please refer to Patient Instructions for Blood Glucose Monitoring and Insulin/Medications Dosing Guide"  in media tab for additional information. Please  also refer to " Patient Self Inventory" in the Media  tab for reviewed elements of pertinent patient history.  Kari Miles participated in the discussions, expressed understanding, and voiced agreement with the above plans.  All questions were answered to her satisfaction. she is encouraged to contact clinic should she have any questions or concerns prior to her return visit.    Follow up plan: Return in 3 months with repeat labs, meter and logs. Gebre Nida, MD [facility] [address] [place], [place] [address] Phone: [phone]  Fax: [phone]    09/05/2018, 1:11 PM  This note was partially dictated with voice recognition software. Similar sounding words can be transcribed inadequately or may not  be corrected upon review.  

## 2018-09-14 NOTE — Progress Notes (Addendum)
Pt's colonoscopy was re-scheduled to 12/15/2018 due to CDC guidelines pertaining to coronavirus.  Pt aware that we are mailing her new instructions along with medication adjustments as well.  Endo notified.

## 2018-09-20 DIAGNOSIS — E1140 Type 2 diabetes mellitus with diabetic neuropathy, unspecified: Secondary | ICD-10-CM | POA: Diagnosis not present

## 2018-09-20 DIAGNOSIS — M170 Bilateral primary osteoarthritis of knee: Secondary | ICD-10-CM | POA: Diagnosis not present

## 2018-09-20 DIAGNOSIS — J410 Simple chronic bronchitis: Secondary | ICD-10-CM | POA: Diagnosis not present

## 2018-09-20 DIAGNOSIS — I10 Essential (primary) hypertension: Secondary | ICD-10-CM | POA: Diagnosis not present

## 2018-12-01 DIAGNOSIS — E1165 Type 2 diabetes mellitus with hyperglycemia: Secondary | ICD-10-CM | POA: Diagnosis not present

## 2018-12-01 LAB — LIPID PANEL
Cholesterol: 94 (ref 0–200)
HDL: 39 (ref 35–70)
LDL Cholesterol: 46
Triglycerides: 49 (ref 40–160)

## 2018-12-01 LAB — HEMOGLOBIN A1C: Hemoglobin A1C: 10.3

## 2018-12-01 LAB — MICROALBUMIN, URINE: Microalb, Ur: 1.2

## 2018-12-06 ENCOUNTER — Ambulatory Visit (INDEPENDENT_AMBULATORY_CARE_PROVIDER_SITE_OTHER): Payer: Medicare Other | Admitting: "Endocrinology

## 2018-12-06 ENCOUNTER — Encounter: Payer: Self-pay | Admitting: "Endocrinology

## 2018-12-06 ENCOUNTER — Ambulatory Visit: Payer: Medicare Other | Admitting: "Endocrinology

## 2018-12-06 ENCOUNTER — Telehealth: Payer: Self-pay | Admitting: *Deleted

## 2018-12-06 VITALS — BP 133/88 | HR 88 | Ht 64.0 in | Wt 182.0 lb

## 2018-12-06 DIAGNOSIS — I10 Essential (primary) hypertension: Secondary | ICD-10-CM

## 2018-12-06 DIAGNOSIS — Z9119 Patient's noncompliance with other medical treatment and regimen: Secondary | ICD-10-CM

## 2018-12-06 DIAGNOSIS — E6609 Other obesity due to excess calories: Secondary | ICD-10-CM | POA: Diagnosis not present

## 2018-12-06 DIAGNOSIS — E1165 Type 2 diabetes mellitus with hyperglycemia: Secondary | ICD-10-CM | POA: Diagnosis not present

## 2018-12-06 DIAGNOSIS — Z6832 Body mass index (BMI) 32.0-32.9, adult: Secondary | ICD-10-CM

## 2018-12-06 DIAGNOSIS — Z91199 Patient's noncompliance with other medical treatment and regimen due to unspecified reason: Secondary | ICD-10-CM

## 2018-12-06 MED ORDER — FREESTYLE LIBRE 14 DAY READER DEVI: 1.0000 | Freq: Once | 0 refills | Status: AC

## 2018-12-06 MED ORDER — INSULIN GLARGINE 100 UNIT/ML SOLOSTAR PEN: 100.0000 [IU] | PEN_INJECTOR | Freq: Every day | SUBCUTANEOUS | 2 refills | Status: DC

## 2018-12-06 MED ORDER — FREESTYLE LIBRE 14 DAY SENSOR MISC: 1.0000 | 2 refills | Status: DC

## 2018-12-06 NOTE — Patient Instructions (Signed)

## 2018-12-06 NOTE — Progress Notes (Signed)
Endocrinology follow-up note       12/06/2018, 2:49 PM   Subjective:    Patient ID: Kari Miles, female    DOB: [birth year].  Kari Miles is being seen in follow-up  for management of currently uncontrolled symptomatic diabetes requested by  Rosita Fire, MD.   Past Medical History:  Diagnosis Date  . Arthritis   . Carpal tunnel syndrome   . Chronic back pain   . Diabetes mellitus without complication ([place])   . Hypertension   . Tendonitis    Past Surgical History:  Procedure Laterality Date  . ABDOMINAL HYSTERECTOMY    . CESAREAN SECTION    . ORTHOPEDIC SURGERY     Social History   Socioeconomic History  . Marital status: Widowed    Spouse name: Not on file  . Number of children: Not on file  . Years of education: Not on file  . Highest education level: Not on file  Occupational History  . Not on file  Social Needs  . Financial resource strain: Not on file  . Food insecurity:    Worry: Not on file    Inability: Not on file  . Transportation needs:    Medical: Not on file    Non-medical: Not on file  Tobacco Use  . Smoking status: Current Every Day Smoker    Types: Cigarettes  . Smokeless tobacco: Never Used  Substance and Sexual Activity  . Alcohol use: No  . Drug use: No  . Sexual activity: Not on file  Lifestyle  . Physical activity:    Days per week: Not on file    Minutes per session: Not on file  . Stress: Not on file  Relationships  . Social connections:    Talks on phone: Not on file    Gets together: Not on file    Attends religious service: Not on file    Active member of club or organization: Not on file    Attends meetings of clubs or organizations: Not on file    Relationship status: Not on file  Other Topics Concern  . Not on file  Social History Narrative  . Not on file    Family History  Problem Relation Age of Onset  . Seizures Mother   .  Heart disease Father   . Cancer Sister   . Heart attack Sister    Outpatient Encounter Medications as of 12/06/2018  Medication Sig  . albuterol (PROVENTIL HFA;VENTOLIN HFA) 108 (90 Base) MCG/ACT inhaler Inhale 2 puffs into the lungs every 6 (six) hours as needed for wheezing or shortness of breath.  Marland Kitchen amLODipine (NORVASC) 10 MG tablet Take 10 mg by mouth daily.  Marland Kitchen aspirin EC 81 MG tablet Take 81 mg by mouth daily.  Marland Kitchen atorvastatin (LIPITOR) 20 MG tablet TAKE ONE TABLET BY MOUTH DAILY. (Patient not taking: Reported on 09/12/2018)  . atorvastatin (LIPITOR) 40 MG tablet Take 40 mg by mouth daily.  . Blood Glucose Monitoring Suppl (ACCU-CHEK GUIDE) w/Device KIT 1 Piece by Does not apply route as directed. Test BG 4 x daily. E11.65  . Continuous Blood Gluc Receiver (  FREESTYLE LIBRE 14 DAY READER) DEVI 1 each by Does not apply route once for 1 dose.  . Continuous Blood Gluc Sensor (FREESTYLE LIBRE 14 DAY SENSOR) MISC Inject 1 each into the skin every 14 (fourteen) days. Use as directed.  . diphenhydrAMINE (BENADRYL) 25 MG tablet Take 25 mg by mouth daily as needed for allergies.  Marland Kitchen glucose blood test strip 1 each by Other route 4 (four) times daily. Use as instructed 4 x daily. E11.65 True Track  . hydrochlorothiazide (HYDRODIURIL) 25 MG tablet Take 25 mg by mouth daily.  Marland Kitchen ibuprofen (ADVIL,MOTRIN) 200 MG tablet Take 400 mg by mouth every 6 (six) hours as needed for headache or moderate pain.  . Ibuprofen-diphenhydrAMINE Cit (IBUPROFEN PM) 200-38 MG TABS Take 1 tablet by mouth at bedtime as needed (sleep).  . Insulin Glargine (LANTUS SOLOSTAR) 100 UNIT/ML Solostar Pen Inject 100 Units into the skin at bedtime.  . insulin lispro (HUMALOG KWIKPEN) 100 UNIT/ML KwikPen Inject 0.25 mLs (25 Units total) into the skin 3 (three) times daily. (Patient taking differently: Inject 20-25 Units into the skin 3 (three) times daily. )  . lisinopril (PRINIVIL,ZESTRIL) 40 MG tablet Take 40 mg by mouth daily.  . metFORMIN  (GLUCOPHAGE) 500 MG tablet TAKE ONE TABLET BY MOUTH TWICE DAILY. TAKE WITH A MEAL. (Patient taking differently: Take 500 mg by mouth 2 (two) times daily. )  . Polyethylene Glycol 400 (BLINK TEARS) 0.25 % SOLN Place 1 drop into both eyes daily as needed (dry eyes).  . [DISCONTINUED] LANTUS SOLOSTAR 100 UNIT/ML Solostar Pen INJECT 80 UNITS AT BEDTIME. (Patient taking differently: Inject 80 Units into the skin at bedtime. )   No facility-administered encounter medications on file as of 12/06/2018.     ALLERGIES: No Known Allergies  VACCINATION STATUS:  There is no immunization history on file for this patient.  Diabetes  She presents for her follow-up diabetic visit. She has type 2 diabetes mellitus. Onset time: she was diagnosed at approximate age of 79 years. Her disease course has been worsening. There are no hypoglycemic associated symptoms. Pertinent negatives for hypoglycemia include no confusion, headaches, pallor or seizures. Associated symptoms include blurred vision, fatigue, polydipsia and polyuria. Pertinent negatives for diabetes include no chest pain and no polyphagia. There are no hypoglycemic complications. Symptoms are worsening. There are no diabetic complications. Risk factors for coronary artery disease include diabetes mellitus, dyslipidemia, family history, hypertension, obesity, sedentary lifestyle, tobacco exposure and post-menopausal. Current diabetic treatment includes insulin injections (She takes Lantus 70 units nightly, Humalog 15 units 3 times a day before meals). Her weight is fluctuating minimally. She is following a generally unhealthy diet. When asked about meal planning, she reported none. She has not had a previous visit with a dietitian. She never participates in exercise. Blood glucose monitoring compliance is inadequate. Her home blood glucose trend is increasing steadily. Her breakfast blood glucose range is generally >200 mg/dl. Her lunch blood glucose range is  generally >200 mg/dl. Her dinner blood glucose range is generally >200 mg/dl. Her bedtime blood glucose range is generally >200 mg/dl. Her overall blood glucose range is >200 mg/dl. An ACE inhibitor/angiotensin II receptor blocker is being taken. She does not see a podiatrist.Eye exam is not current.  Hypertension  This is a chronic problem. The current episode started more than 1 year ago. The problem is uncontrolled. Associated symptoms include blurred vision. Pertinent negatives include no chest pain, headaches, palpitations or shortness of breath. Risk factors for coronary artery disease include diabetes  mellitus, obesity, sedentary lifestyle, smoking/tobacco exposure and family history. Past treatments include ACE inhibitors. Compliance problems include psychosocial issues and diet.     Review of Systems  Constitutional: Positive for fatigue. Negative for chills, fever and unexpected weight change.  HENT: Negative for trouble swallowing and voice change.   Eyes: Positive for blurred vision. Negative for visual disturbance.  Respiratory: Negative for cough, shortness of breath and wheezing.   Cardiovascular: Negative for chest pain, palpitations and leg swelling.  Gastrointestinal: Negative for diarrhea, nausea and vomiting.  Endocrine: Positive for polydipsia and polyuria. Negative for cold intolerance, heat intolerance and polyphagia.  Musculoskeletal: Negative for arthralgias and myalgias.  Skin: Negative for color change, pallor, rash and wound.  Neurological: Negative for seizures and headaches.  Psychiatric/Behavioral: Negative for confusion and suicidal ideas.    Objective:    BP 133/88   Pulse 88   Ht _0  (1.626 m)   Wt 182 lb (82.6 kg)   BMI 31.24 kg/m   Wt Readings from Last 3 Encounters:  12/06/18 182 lb (82.6 kg)  09/05/18 182 lb (82.6 kg)  08/02/18 186 lb (84.4 kg)     Physical Exam  Constitutional: She is oriented to person, place, and time. She appears  well-developed.  HENT:  Head: Normocephalic and atraumatic.  Eyes: EOM are normal.  Neck: Normal range of motion. Neck supple. No tracheal deviation present. No thyromegaly present.  Cardiovascular: Normal rate.  Pulmonary/Chest: Effort normal.  Abdominal: There is no abdominal tenderness. There is no guarding.  Musculoskeletal: Normal range of motion.        General: No edema.  Neurological: She is alert and oriented to person, place, and time. She has normal reflexes. No cranial nerve deficit. Coordination normal.  Skin: Skin is warm and dry. No rash noted. No erythema. No pallor.  Psychiatric: She has a normal mood and affect. Judgment normal.  Patient has a reluctant, unconcerned affect.    CMP ( most recent) CMP     Component Value Date/Time   NA 136 09/22/2017 1130   K 3.9 09/22/2017 1130   CL 99 09/22/2017 1130   CO2 32 09/22/2017 1130   GLUCOSE 225 (H) 09/22/2017 1130   BUN 16 07/25/2018   CREATININE 0.6 07/25/2018   CREATININE 0.56 09/22/2017 1130   CALCIUM 9.7 09/22/2017 1130   PROT 7.1 09/22/2017 1130   ALBUMIN 4.6 04/28/2013 1236   AST 27 09/22/2017 1130   ALT 48 (H) 09/22/2017 1130   ALKPHOS 77 04/28/2013 1236   BILITOT 0.6 09/22/2017 1130   GFRNONAA 104 09/22/2017 1130   GFRAA 120 09/22/2017 1130    Diabetic Labs (most recent): Lab Results  Component Value Date   HGBA1C 10.3 12/01/2018   HGBA1C 9.9 07/25/2018   HGBA1C 10.9 (H) 09/22/2017     Lipid Panel     Component Value Date/Time   CHOL 94 12/01/2018   TRIG 49 12/01/2018   HDL 39 12/01/2018   LDLCALC 46 12/01/2018      Assessment & Plan:   1. Uncontrolled type 2 diabetes mellitus with hyperglycemia ([place])  - Kari Miles has currently uncontrolled symptomatic type 2 DM since  58 years of age. -She returns with a log with suspicious readings, still above target, A1c of 10.3% increasing from 9.9%.    -her diabetes is complicated by noncompliance/nonadherence, chronic heavy smoking and  HARMONEY SIENKIEWICZ remains at a high risk for more acute and chronic complications which include CAD, CVA, CKD, retinopathy, and neuropathy.  These are all discussed in detail with the patient.  - I have counseled her on diet management and weight loss, by adopting a carbohydrate restricted/protein rich diet.  - she  admits there is a room for improvement in her diet and drink choices. -  Suggestion is made for her to avoid simple carbohydrates  from her diet including Cakes, Sweet Desserts / Pastries, Ice Cream, Soda (diet and regular), Sweet Tea, Candies, Chips, Cookies, Sweet Pastries,  Store Bought Juices, Alcohol in Excess of  1-2 drinks a day, Artificial Sweeteners, Coffee Creamer, and "Sugar-free" Products. This will help patient to have stable blood glucose profile and potentially avoid unintended weight gain.   - I encouraged her to switch to  unprocessed or minimally processed complex starch and increased protein intake (animal or plant source), fruits, and vegetables.  - she is advised to stick to a routine mealtimes to eat 3 meals  a day and avoid unnecessary snacks ( to snack only to correct hypoglycemia).   - I have approached her with the following individualized plan to manage diabetes and patient agrees:   -Given her current and prevailing glycemic burden, she will continue to require intensive treatment with basal/bolus insulin probably with higher dose.  However, this patient does not display optimal engagement for proper monitoring for safe use of insulin.  -This will make it difficult to adjust her treatment.   -She is advised to increase her Lantus to 100 units nightly, continue  Humalog  20 units 3 times a day with meals  for pre-meal BG readings of 90-121m/dl, plus patient specific correction dose for unexpected hyperglycemia above 151mdl, associated with strict monitoring of glucose 4 times a day-before meals and at bedtime. - Patient is warned not to take insulin without  proper monitoring per orders. -Adjustment parameters are given for hypo and hyperglycemia in writing. -Patient is encouraged to call clinic for blood glucose levels less than 70 or above 300 mg /dl. -She is given a prescription for CGM device. -She continues to have normal renal function.  She is advised to continue metformin 500 mg p.o. twice daily after breakfast and supper.   - she not a suitable candidate for incretin therapy given her chronic heavy smoking.   - Patient specific target  A1c;  LDL, HDL, Triglycerides, and  Waist Circumference were discussed in detail.  2) BP/HTN: Her blood pressure is controlled to target.    She is advised to continue  her current blood pressure medications including hydrochlorothiazide 25 mg by mouth daily, lisinopril 40 mg by mouth daily, amlodipine 10 mg by mouth daily.   3) Lipids/HPL: Her recent lipid panel showed improved LDL at 104.  She is advised to continue atorvastatin 20 mg p.o. nightly.    4)  Weight/Diet: CDE Consult will be initiated , exercise, and detailed carbohydrates information provided.  5) Chronic Care/Health Maintenance:  -she  is on ACEI medications and  is encouraged to continue to follow up with Ophthalmology, Dentist,  Podiatrist at least yearly or according to recommendations, and advised to  quit smoking. I have recommended yearly flu vaccine and pneumonia vaccination at least every 5 years; moderate intensity exercise for up to 150 minutes weekly; and  sleep for at least 7 hours a day. -She is tentatively counseled for smoking cessation.    - I advised patient to maintain close follow up with FaRosita FireMD for primary care needs.  - Time spent with the patient: 25 min, of which >50%  was spent in reviewing her blood glucose logs , discussing her hypoglycemia and hyperglycemia episodes, reviewing her current and  previous labs / studies and medications  doses and developing a plan to avoid hypoglycemia and hyperglycemia.  Please refer to Patient Instructions for Blood Glucose Monitoring and Insulin/Medications Dosing Guide"  in media tab for additional information. Please  also refer to " Patient Self Inventory" in the Media  tab for reviewed elements of pertinent patient history.  Kari Miles participated in the discussions, expressed understanding, and voiced agreement with the above plans.  All questions were answered to her satisfaction. she is encouraged to contact clinic should she have any questions or concerns prior to her return visit.   Follow up plan: Return in 3 months with repeat labs, meter and logs. Glade Lloyd, MD [facility] [address] [place], [place] [address] Phone: [phone]  Fax: [phone]    12/06/2018, 2:49 PM  This note was partially dictated with voice recognition software. Similar sounding words can be transcribed inadequately or may not  be corrected upon review.

## 2018-12-06 NOTE — Telephone Encounter (Signed)
Pt is scheduled for COVID 19 screening on 12/12/2018.  Pt is aware to remain in quarantine once testing is done.  Pt voiced understanding.

## 2018-12-12 ENCOUNTER — Other Ambulatory Visit: Payer: Self-pay

## 2018-12-12 ENCOUNTER — Telehealth: Payer: Self-pay | Admitting: *Deleted

## 2018-12-12 ENCOUNTER — Other Ambulatory Visit: Payer: Medicare Other

## 2018-12-12 ENCOUNTER — Other Ambulatory Visit (HOSPITAL_COMMUNITY)
Admission: RE | Admit: 2018-12-12 | Discharge: 2018-12-12 | Disposition: A | Discharge status: Home / Self Care | Payer: Medicare Other | Source: Ambulatory Visit | Attending: Gastroenterology | Admitting: Gastroenterology

## 2018-12-12 DIAGNOSIS — Z20822 Contact with and (suspected) exposure to covid-19: Secondary | ICD-10-CM

## 2018-12-12 DIAGNOSIS — Z1159 Encounter for screening for other viral diseases: Secondary | ICD-10-CM | POA: Insufficient documentation

## 2018-12-12 NOTE — Telephone Encounter (Signed)
Called mobile #-VM not set up Called home #. SPOKE WITH PATIENT. She was able to move procedure time up to 9:30am. Patient aware to arrive at 8:30am. She will drink 2nd half of prep at 4:30am, npo after 6:30am. Called endo and made aware of appt changed

## 2018-12-13 LAB — NOVEL CORONAVIRUS, NAA (HOSP ORDER, SEND-OUT TO REF LAB; TAT 18-24 HRS): SARS-CoV-2, NAA: NOT DETECTED

## 2018-12-15 ENCOUNTER — Ambulatory Visit (HOSPITAL_COMMUNITY): Admission: RE | Admit: 2018-12-15 | Payer: Medicare Other | Source: Home / Self Care | Admitting: Gastroenterology

## 2018-12-15 ENCOUNTER — Encounter (HOSPITAL_COMMUNITY): Admission: RE | Payer: Self-pay | Source: Home / Self Care

## 2018-12-15 ENCOUNTER — Telehealth: Payer: Self-pay

## 2018-12-15 SURGERY — COLONOSCOPY
Anesthesia: Moderate Sedation

## 2018-12-15 NOTE — Addendum Note (Signed)
Addended by: Metro Kung on: 12/15/2018 12:56 PM   Modules accepted: Orders, SmartSet

## 2018-12-15 NOTE — Telephone Encounter (Signed)
Melanie at [facility] called office, pt canceled TCS that was for today. She didn't feel good and thought her sugar was up.

## 2018-12-15 NOTE — OR Nursing (Signed)
Patient was scheduled to arrive at 0830 this morning for a colonoscopy. Called patient and she stated she was not "feeling good today" and she wants to cancel for today and reschedule. Kari Miles at the office notified.

## 2018-12-15 NOTE — Telephone Encounter (Signed)
Called pt to re-schedule her procedure.  Pt requested an August date.  Re-scheduled her to 02/10/2019.  Pt aware that we will send out new prep instructions.  Spoke with Hoyle Sauer in Endo and pt will need new orders.  Placed new orders in Epic.

## 2018-12-27 ENCOUNTER — Telehealth: Payer: Self-pay | Admitting: *Deleted

## 2018-12-27 NOTE — Telephone Encounter (Signed)
Called pt to schedule COVID 19 screening but pt did not answer.  No vm available.

## 2018-12-28 ENCOUNTER — Telehealth: Payer: Self-pay | Admitting: *Deleted

## 2018-12-28 NOTE — Telephone Encounter (Signed)
Pt is scheduled for her COVID 19 screening on 02/07/2019.  Pt is aware to remain in quarantine once testing is done.  Pt voiced understanding.

## 2019-01-12 DIAGNOSIS — J410 Simple chronic bronchitis: Secondary | ICD-10-CM | POA: Diagnosis not present

## 2019-01-12 DIAGNOSIS — F17200 Nicotine dependence, unspecified, uncomplicated: Secondary | ICD-10-CM | POA: Diagnosis not present

## 2019-01-12 DIAGNOSIS — E1140 Type 2 diabetes mellitus with diabetic neuropathy, unspecified: Secondary | ICD-10-CM | POA: Diagnosis not present

## 2019-01-12 DIAGNOSIS — I10 Essential (primary) hypertension: Secondary | ICD-10-CM | POA: Diagnosis not present

## 2019-01-12 DIAGNOSIS — F17210 Nicotine dependence, cigarettes, uncomplicated: Secondary | ICD-10-CM | POA: Diagnosis not present

## 2019-01-23 ENCOUNTER — Telehealth: Payer: Self-pay

## 2019-01-23 MED ORDER — ATORVASTATIN CALCIUM 20 MG PO TABS: 20.0000 mg | ORAL_TABLET | Freq: Every day | ORAL | 2 refills | Status: DC

## 2019-01-23 NOTE — Telephone Encounter (Signed)
LeighAnn Ezel Vallone, CMA  

## 2019-02-07 ENCOUNTER — Other Ambulatory Visit (HOSPITAL_COMMUNITY)
Admission: RE | Admit: 2019-02-07 | Discharge: 2019-02-07 | Disposition: A | Discharge status: Home / Self Care | Payer: Medicare Other | Source: Ambulatory Visit | Attending: Gastroenterology | Admitting: Gastroenterology

## 2019-02-07 ENCOUNTER — Other Ambulatory Visit: Payer: Self-pay

## 2019-02-07 DIAGNOSIS — Z01812 Encounter for preprocedural laboratory examination: Secondary | ICD-10-CM | POA: Insufficient documentation

## 2019-02-07 DIAGNOSIS — Z20828 Contact with and (suspected) exposure to other viral communicable diseases: Secondary | ICD-10-CM | POA: Insufficient documentation

## 2019-02-07 LAB — SARS CORONAVIRUS 2 (TAT 6-24 HRS): SARS Coronavirus 2: NEGATIVE

## 2019-02-10 ENCOUNTER — Encounter (HOSPITAL_COMMUNITY)
Admission: RE | Disposition: A | Discharge status: Home / Self Care | Payer: Self-pay | Source: Home / Self Care | Attending: Gastroenterology

## 2019-02-10 ENCOUNTER — Other Ambulatory Visit: Payer: Self-pay

## 2019-02-10 ENCOUNTER — Encounter (HOSPITAL_COMMUNITY): Payer: Self-pay | Admitting: *Deleted

## 2019-02-10 ENCOUNTER — Ambulatory Visit (HOSPITAL_COMMUNITY)
Admission: RE | Admit: 2019-02-10 | Discharge: 2019-02-10 | Disposition: A | Discharge status: Home / Self Care | Payer: Medicare Other | Attending: Gastroenterology | Admitting: Gastroenterology

## 2019-02-10 ENCOUNTER — Telehealth: Payer: Self-pay | Admitting: Gastroenterology

## 2019-02-10 DIAGNOSIS — Z5309 Procedure and treatment not carried out because of other contraindication: Secondary | ICD-10-CM | POA: Insufficient documentation

## 2019-02-10 DIAGNOSIS — Z1211 Encounter for screening for malignant neoplasm of colon: Secondary | ICD-10-CM | POA: Diagnosis not present

## 2019-02-10 LAB — GLUCOSE, CAPILLARY
Glucose-Capillary: 331 mg/dL — ABNORMAL HIGH (ref 70–99)
Glucose-Capillary: 349 mg/dL — ABNORMAL HIGH (ref 70–99)
Glucose-Capillary: 321 mg/dL — ABNORMAL HIGH (ref 70–99)

## 2019-02-10 SURGERY — COLONOSCOPY
Anesthesia: Moderate Sedation

## 2019-02-10 MED ORDER — SODIUM CHLORIDE 0.9 % IV SOLN
INTRAVENOUS | Status: DC
  Administered 2019-02-10: 10:00:00 via INTRAVENOUS

## 2019-02-10 MED ORDER — MEPERIDINE HCL 100 MG/ML IJ SOLN
INTRAMUSCULAR | Status: AC
  Filled 2019-02-10: qty 1

## 2019-02-10 MED ORDER — INSULIN ASPART 100 UNIT/ML ~~LOC~~ SOLN
6.0000 [IU] | Freq: Once | SUBCUTANEOUS | Status: AC
  Administered 2019-02-10: 6 [IU] via SUBCUTANEOUS
  Filled 2019-02-10: qty 0.06

## 2019-02-10 MED ORDER — OMEPRAZOLE 20 MG PO CPDR: DELAYED_RELEASE_CAPSULE | ORAL | 3 refills | Status: DC

## 2019-02-10 MED ORDER — MIDAZOLAM HCL 5 MG/5ML IJ SOLN
INTRAMUSCULAR | Status: AC
  Filled 2019-02-10: qty 5

## 2019-02-10 NOTE — Telephone Encounter (Signed)
Threasa Beards called to say that SF wants patient rescheduled in 4-6 weeks for procedure. SF said the patient's sugar was too high and cancelled.

## 2019-02-10 NOTE — Discharge Instructions (Signed)
I AM SORRY WE WERE UNABLE TO COMPLETE YOUR COLONOSCOPY TODAY. WE COULD NOT DO IT BECAUSE YOUR BLOOD SUGAR IS OVER 300.   EAT TO LIVE AND THINK OF FOOD AS MEDICINE.  DRINK WATER TO KEEP YOUR URINE LIGHT YELLOW.  To have more energy, GET YOUR BLOOD SUGAR UNDE CONTROL, and to lose weight:      1. CONTINUE YOUR WEIGHT LOSS EFFORTS. I RECOMMEND YOU READ AND FOLLOW RECOMMENDATIONS BY DR. MARK HYMAN, "10-DAY DETOX DIET".    2. If you must eat bread, EAT EZEKIEL BREAD. IT IS IN THE FROZEN SECTION OF THE GROCERY STORE.    3. Do not drink SODA, GATORADE, ENERGY DRINKS, OR DIET SODA.     4. AVOID HIGH FRUCTOSE CORN SYRUP.     5. DO NOT chew SUGAR FREE GUM OR USE ARTIFICIAL SWEETENERS. USE STEVIA AS A SWEETENER.    6. DO NOT EAT ENRICHED WHEAT FLOUR, PASTA, RICE, OR CEREAL.    7. DO NOT EAT "FARM RAISED" MEAT OR FISH. ONLY EAT WILD CAUGHT SEAFOOD, GRASS FED BEEF OR CHICKEN, OR EGGS FROM PASTURE RAISED CHICKENS.    8. PRACTICE CHAIR YOGA FOR 15-30 MINS 3 OR 4 TIMES A WEEK. SEE HANDOUT. DO EACH EXERCISE 10 TIMES.     9. TAKE MULTIVITAMIN, VITAMIN B12, AND VITAMIN D3 2000 IU DAILY.    PLEASE CALL DR. NIDA TODAY AND LET HIM KNOW YOUR DIABETES IS NOT WELL CONTROLLED.  COMPLETE COLONOSCOPY IN THE NEXT 4-6 WEEKS.  FOLLOW UP IN 6 MOS WITH DR. Vandora Jaskulski.

## 2019-02-10 NOTE — OR Nursing (Signed)
Procedure cancelled by Dr. Oneida Alar due to blood sugar >300 after 2 doses of insulin.

## 2019-02-13 NOTE — Telephone Encounter (Signed)
Tried to call pt but no answer X 2 times.

## 2019-02-14 NOTE — Progress Notes (Signed)
CC'D TO PCP °

## 2019-02-15 NOTE — Telephone Encounter (Signed)
Tried to call pt again but no answer.

## 2019-02-16 ENCOUNTER — Encounter: Payer: Self-pay | Admitting: *Deleted

## 2019-02-16 DIAGNOSIS — Z1159 Encounter for screening for other viral diseases: Secondary | ICD-10-CM | POA: Diagnosis not present

## 2019-02-16 MED ORDER — NA SULFATE-K SULFATE-MG SULF 17.5-3.13-1.6 GM/177ML PO SOLN: 1.0000 | Freq: Once | ORAL | 0 refills | Status: AC

## 2019-02-16 NOTE — Addendum Note (Signed)
Addended by: Metro Kung on: 02/16/2019 04:05 PM   Modules accepted: Orders, SmartSet

## 2019-02-16 NOTE — Telephone Encounter (Signed)
Spoke with pt and rescheduled her procedure to 04/03/2019.  She is also aware of her new Covid screening appointment on 03/31/2019.  She was reminded to remain in quarantine after testing.  Pt voiced understanding.  Pt is aware that new prep RX is being sent to [facility].

## 2019-02-27 DIAGNOSIS — Z1159 Encounter for screening for other viral diseases: Secondary | ICD-10-CM | POA: Diagnosis not present

## 2019-03-01 DIAGNOSIS — I10 Essential (primary) hypertension: Secondary | ICD-10-CM | POA: Diagnosis not present

## 2019-03-01 DIAGNOSIS — E1140 Type 2 diabetes mellitus with diabetic neuropathy, unspecified: Secondary | ICD-10-CM | POA: Diagnosis not present

## 2019-03-17 DIAGNOSIS — Z03818 Encounter for observation for suspected exposure to other biological agents ruled out: Secondary | ICD-10-CM | POA: Diagnosis not present

## 2019-03-21 NOTE — Telephone Encounter (Signed)
VM received from pt. Pt would like her prep sent into her pharmacy.

## 2019-03-21 NOTE — Telephone Encounter (Signed)
Pt said that she didn't realize that we had already sent the RX.  She is going to follow up with her pharmacy and call me back if she has any issues.

## 2019-03-21 NOTE — Telephone Encounter (Signed)
Tried to call pt back but no answer. 

## 2019-03-29 DIAGNOSIS — R921 Mammographic calcification found on diagnostic imaging of breast: Secondary | ICD-10-CM | POA: Diagnosis not present

## 2019-03-29 DIAGNOSIS — R928 Other abnormal and inconclusive findings on diagnostic imaging of breast: Secondary | ICD-10-CM | POA: Diagnosis not present

## 2019-03-31 ENCOUNTER — Other Ambulatory Visit (HOSPITAL_COMMUNITY)
Admission: RE | Admit: 2019-03-31 | Discharge: 2019-03-31 | Disposition: A | Discharge status: Home / Self Care | Payer: Medicare Other | Source: Ambulatory Visit | Attending: Gastroenterology | Admitting: Gastroenterology

## 2019-03-31 ENCOUNTER — Other Ambulatory Visit: Payer: Self-pay

## 2019-03-31 DIAGNOSIS — I10 Essential (primary) hypertension: Secondary | ICD-10-CM | POA: Diagnosis not present

## 2019-03-31 DIAGNOSIS — E1140 Type 2 diabetes mellitus with diabetic neuropathy, unspecified: Secondary | ICD-10-CM | POA: Diagnosis not present

## 2019-04-01 NOTE — Progress Notes (Signed)
Patient did not come for her Covid 19 test, procedure is for Monday 04/03/2019. Messaged Dr. Oneida Alar.

## 2019-04-02 NOTE — OR Nursing (Signed)
Patient did not show up for Covid test. Spoke with patient who said "I went somewhere and had it but I didn't come down there." Patient unable to tell where she had the test performed.  Patient informed that she needs to come to [facility] for Covid test priort to her colonoscopy. Will notify the office in the am.

## 2019-04-03 ENCOUNTER — Telehealth: Payer: Self-pay

## 2019-04-03 ENCOUNTER — Ambulatory Visit (HOSPITAL_COMMUNITY): Admission: RE | Admit: 2019-04-03 | Payer: Medicare Other | Source: Home / Self Care | Admitting: Gastroenterology

## 2019-04-03 ENCOUNTER — Encounter (HOSPITAL_COMMUNITY): Admission: RE | Payer: Self-pay | Source: Home / Self Care

## 2019-04-03 SURGERY — COLONOSCOPY
Anesthesia: Moderate Sedation

## 2019-04-03 NOTE — Telephone Encounter (Signed)
Noted  

## 2019-04-03 NOTE — Telephone Encounter (Signed)
Melanie at [facility] called office, pt was no show for COVID test. TCS for today was cancelled.   Routing to AS.

## 2019-04-03 NOTE — Telephone Encounter (Signed)
Called pt and she is aware of new procedure date on 06/28/2019 and Covid screening on 06/26/2019.  Pt aware that I will mail her out new prep instructions and Covid information.  Pt voiced understanding.

## 2019-04-04 ENCOUNTER — Encounter: Payer: Self-pay | Admitting: *Deleted

## 2019-04-04 NOTE — Addendum Note (Signed)
Addended by: Metro Kung on: 04/04/2019 01:28 PM   Modules accepted: Orders, SmartSet

## 2019-04-05 ENCOUNTER — Other Ambulatory Visit: Payer: Self-pay | Admitting: "Endocrinology

## 2019-04-06 ENCOUNTER — Other Ambulatory Visit: Payer: Self-pay | Admitting: "Endocrinology

## 2019-04-07 ENCOUNTER — Encounter: Payer: Self-pay | Admitting: "Endocrinology

## 2019-04-07 ENCOUNTER — Other Ambulatory Visit: Payer: Self-pay

## 2019-04-07 ENCOUNTER — Ambulatory Visit (INDEPENDENT_AMBULATORY_CARE_PROVIDER_SITE_OTHER): Payer: Medicare Other | Admitting: "Endocrinology

## 2019-04-07 VITALS — BP 165/98 | HR 77 | Temp 97.7°F | Ht 64.0 in | Wt 186.6 lb

## 2019-04-07 DIAGNOSIS — E1165 Type 2 diabetes mellitus with hyperglycemia: Secondary | ICD-10-CM | POA: Diagnosis not present

## 2019-04-07 DIAGNOSIS — I10 Essential (primary) hypertension: Secondary | ICD-10-CM | POA: Diagnosis not present

## 2019-04-07 LAB — POCT GLYCOSYLATED HEMOGLOBIN (HGB A1C): Hemoglobin A1C: 11.5 % — AB (ref 4.0–5.6)

## 2019-04-07 NOTE — Patient Instructions (Signed)
Bring all of your leftover insulin with you: both lantus and Humalog next visit.

## 2019-04-07 NOTE — Progress Notes (Signed)
Endocrinology follow-up note       04/07/2019, 11:50 AM   Subjective:    Patient ID: Kari Miles, female    DOB: June 03, 1961.  Kari Miles is being seen in follow-up  for management of currently uncontrolled symptomatic diabetes requested by  Rosita Fire, MD.   Past Medical History:  Diagnosis Date  . Arthritis   . Carpal tunnel syndrome   . Chronic back pain   . Diabetes mellitus without complication ([place])   . Hypertension   . Tendonitis    Past Surgical History:  Procedure Laterality Date  . ABDOMINAL HYSTERECTOMY    . CESAREAN SECTION    . ORTHOPEDIC SURGERY     Social History   Socioeconomic History  . Marital status: Widowed    Spouse name: Not on file  . Number of children: Not on file  . Years of education: Not on file  . Highest education level: Not on file  Occupational History  . Not on file  Social Needs  . Financial resource strain: Not on file  . Food insecurity    Worry: Not on file    Inability: Not on file  . Transportation needs    Medical: Not on file    Non-medical: Not on file  Tobacco Use  . Smoking status: Current Every Day Smoker    Packs/day: 1.00    Years: 30.00    Pack years: 30.00    Types: Cigarettes  . Smokeless tobacco: Never Used  Substance and Sexual Activity  . Alcohol use: No  . Drug use: No  . Sexual activity: Not on file  Lifestyle  . Physical activity    Days per week: Not on file    Minutes per session: Not on file  . Stress: Not on file  Relationships  . Social Herbalist on phone: Not on file    Gets together: Not on file    Attends religious service: Not on file    Active member of club or organization: Not on file    Attends meetings of clubs or organizations: Not on file    Relationship status: Not on file  Other Topics Concern  . Not on file  Social History Narrative  . Not on file    Family History   Problem Relation Age of Onset  . Seizures Mother   . Heart disease Father   . Cancer Sister   . Heart attack Sister   . Colon cancer Neg Hx    Outpatient Encounter Medications as of 04/07/2019  Medication Sig  . Insulin Lispro (HUMALOG KWIKPEN [place]) Inject 3,036 Units into the skin 3 (three) times daily before meals.  Marland Kitchen albuterol (PROVENTIL HFA;VENTOLIN HFA) 108 (90 Base) MCG/ACT inhaler Inhale 2 puffs into the lungs every 6 (six) hours as needed for wheezing or shortness of breath.  Marland Kitchen amLODipine (NORVASC) 10 MG tablet Take 10 mg by mouth daily.  Marland Kitchen aspirin EC 81 MG tablet Take 81 mg by mouth daily.  Marland Kitchen atorvastatin (LIPITOR) 20 MG tablet Take 20 mg by mouth daily.  . Blood Glucose Monitoring Suppl (ACCU-CHEK GUIDE)  w/Device KIT 1 Piece by Does not apply route as directed. Test BG 4 x daily. E11.65  . Continuous Blood Gluc Sensor (FREESTYLE LIBRE 14 DAY SENSOR) MISC Inject 1 each into the skin every 14 (fourteen) days. Use as directed.  . diphenhydrAMINE (BENADRYL) 25 MG tablet Take 25 mg by mouth daily as needed for allergies.  Marland Kitchen glucose blood test strip 1 each by Other route 4 (four) times daily. Use as instructed 4 x daily. E11.65 True Track  . hydrochlorothiazide (HYDRODIURIL) 25 MG tablet Take 25 mg by mouth daily.  Marland Kitchen ibuprofen (ADVIL,MOTRIN) 200 MG tablet Take 400 mg by mouth every 6 (six) hours as needed for headache or moderate pain.  . Ibuprofen-diphenhydrAMINE Cit (IBUPROFEN PM) 200-38 MG TABS Take 1 tablet by mouth at bedtime as needed (sleep).  Marland Kitchen LANTUS SOLOSTAR 100 UNIT/ML Solostar Pen INJECT 100 UNITS SUBCUTANEOUSLY AT BEDTIME.  Marland Kitchen lisinopril (ZESTRIL) 40 MG tablet Take 40 mg by mouth daily.  . metFORMIN (GLUCOPHAGE) 500 MG tablet TAKE ONE TABLET BY MOUTH TWICE DAILY. TAKE WITH A MEAL. (Patient taking differently: Take 500 mg by mouth 2 (two) times daily with a meal. )  . omeprazole (PRILOSEC) 20 MG capsule 1 PO 30 MINS PRIOR TO BREAKFAST. (Patient taking differently: Take 20 mg by  mouth daily before breakfast. )  . Polyethylene Glycol 400 (BLINK TEARS) 0.25 % SOLN Place 1 drop into both eyes every 6 (six) hours as needed (dry eyes).   . [DISCONTINUED] insulin lispro (HUMALOG KWIKPEN) 100 UNIT/ML KwikPen Inject 0.25-0.31 mLs (25-31 Units total) into the skin 3 (three) times daily.   No facility-administered encounter medications on file as of 04/07/2019.     ALLERGIES: No Known Allergies  VACCINATION STATUS:  There is no immunization history on file for this patient.  Diabetes She presents for her follow-up diabetic visit. She has type 2 diabetes mellitus. Onset time: she was diagnosed at approximate age of 58 years. Her disease course has been worsening. There are no hypoglycemic associated symptoms. Pertinent negatives for hypoglycemia include no confusion, headaches, pallor or seizures. Associated symptoms include blurred vision, fatigue, polydipsia and polyuria. Pertinent negatives for diabetes include no chest pain and no polyphagia. There are no hypoglycemic complications. Symptoms are worsening. There are no diabetic complications. Risk factors for coronary artery disease include diabetes mellitus, dyslipidemia, family history, hypertension, obesity, sedentary lifestyle, tobacco exposure and post-menopausal. Current diabetic treatment includes insulin injections (She takes Lantus 70 units nightly, Humalog 15 units 3 times a day before meals). Her weight is increasing steadily. She is following a generally unhealthy diet. When asked about meal planning, she reported none. She has not had a previous visit with a dietitian. She never participates in exercise. Her home blood glucose trend is increasing steadily. Her breakfast blood glucose range is generally >200 mg/dl. Her lunch blood glucose range is generally >200 mg/dl. Her dinner blood glucose range is generally >200 mg/dl. Her bedtime blood glucose range is generally >200 mg/dl. Her overall blood glucose range is >200  mg/dl. An ACE inhibitor/angiotensin II receptor blocker is being taken. She does not see a podiatrist.Eye exam is not current.  Hypertension This is a chronic problem. The current episode started more than 1 year ago. The problem is uncontrolled. Associated symptoms include blurred vision. Pertinent negatives include no chest pain, headaches, palpitations or shortness of breath. Risk factors for coronary artery disease include diabetes mellitus, obesity, sedentary lifestyle, smoking/tobacco exposure and family history. Past treatments include ACE inhibitors. Compliance problems include psychosocial  issues and diet.     Review of Systems  Constitutional: Positive for fatigue. Negative for chills, fever and unexpected weight change.  HENT: Negative for trouble swallowing and voice change.   Eyes: Positive for blurred vision. Negative for visual disturbance.  Respiratory: Negative for cough, shortness of breath and wheezing.   Cardiovascular: Negative for chest pain, palpitations and leg swelling.  Gastrointestinal: Negative for diarrhea, nausea and vomiting.  Endocrine: Positive for polydipsia and polyuria. Negative for cold intolerance, heat intolerance and polyphagia.  Musculoskeletal: Negative for arthralgias and myalgias.  Skin: Negative for color change, pallor, rash and wound.  Neurological: Negative for seizures and headaches.  Psychiatric/Behavioral: Negative for confusion and suicidal ideas.    Objective:    BP (!) 165/98 (BP Location: Left Arm, Patient Position: Sitting, Cuff Size: Normal)   Pulse 77   Temp 97.7 F (36.5 C) (Oral)   Ht '5\' 4"'  (1.626 m)   Wt 186 lb 9.6 oz (84.6 kg)   SpO2 100%   BMI 32.03 kg/m   Wt Readings from Last 3 Encounters:  04/07/19 186 lb 9.6 oz (84.6 kg)  02/10/19 180 lb (81.6 kg)  12/06/18 182 lb (82.6 kg)     Physical Exam  Constitutional: She is oriented to person, place, and time. She appears well-developed.  HENT:  Head: Normocephalic and  atraumatic.  Eyes: EOM are normal.  Neck: Normal range of motion. Neck supple. No tracheal deviation present. No thyromegaly present.  Cardiovascular: Normal rate.  Pulmonary/Chest: Effort normal.  Abdominal: There is no abdominal tenderness. There is no guarding.  Musculoskeletal: Normal range of motion.        General: No edema.  Neurological: She is alert and oriented to person, place, and time. She has normal reflexes. No cranial nerve deficit. Coordination normal.  Skin: Skin is warm and dry. No rash noted. No erythema. No pallor.  Psychiatric: She has a normal mood and affect. Judgment normal.  Patient has a reluctant, unconcerned affect.    CMP ( most recent) CMP     Component Value Date/Time   NA 136 09/22/2017 1130   K 3.9 09/22/2017 1130   CL 99 09/22/2017 1130   CO2 32 09/22/2017 1130   GLUCOSE 225 (H) 09/22/2017 1130   BUN 16 07/25/2018   CREATININE 0.6 07/25/2018   CREATININE 0.56 09/22/2017 1130   CALCIUM 9.7 09/22/2017 1130   PROT 7.1 09/22/2017 1130   ALBUMIN 4.6 04/28/2013 1236   AST 27 09/22/2017 1130   ALT 48 (H) 09/22/2017 1130   ALKPHOS 77 04/28/2013 1236   BILITOT 0.6 09/22/2017 1130   GFRNONAA 104 09/22/2017 1130   GFRAA 120 09/22/2017 1130    Diabetic Labs (most recent): Lab Results  Component Value Date   HGBA1C 11.5 (A) 04/07/2019   HGBA1C 10.3 12/01/2018   HGBA1C 9.9 07/25/2018     Lipid Panel     Component Value Date/Time   CHOL 94 12/01/2018   TRIG 49 12/01/2018   HDL 39 12/01/2018   LDLCALC 46 12/01/2018      Assessment & Plan:   1. Uncontrolled type 2 diabetes mellitus with hyperglycemia ([place])  - Kari Miles has currently uncontrolled symptomatic type 2 DM since  58 years of age. -She returns with a log with persistently and significantly above target, her point-of-care A1c is 11.5% progressively increasing from 9.9%.    her diabetes is complicated by noncompliance/nonadherence, chronic heavy smoking and Kari Miles  remains at a high risk for more  acute and chronic complications which include CAD, CVA, CKD, retinopathy, and neuropathy. These are all discussed in detail with the patient.  - I have counseled her on diet management and weight loss, by adopting a carbohydrate restricted/protein rich diet.  - she  admits there is a room for improvement in her diet and drink choices. -  Suggestion is made for her to avoid simple carbohydrates  from her diet including Cakes, Sweet Desserts / Pastries, Ice Cream, Soda (diet and regular), Sweet Tea, Candies, Chips, Cookies, Sweet Pastries,  Store Bought Juices, Alcohol in Excess of  1-2 drinks a day, Artificial Sweeteners, Coffee Creamer, and "Sugar-free" Products. This will help patient to have stable blood glucose profile and potentially avoid unintended weight gain.   - I encouraged her to switch to  unprocessed or minimally processed complex starch and increased protein intake (animal or plant source), fruits, and vegetables.  - she is advised to stick to a routine mealtimes to eat 3 meals  a day and avoid unnecessary snacks ( to snack only to correct hypoglycemia).   - I have approached her with the following individualized plan to manage diabetes and patient agrees:   -Given her current and prevailing glycemic burden, she will continue to require intensive treatment with basal/bolus insulin probably with higher dose.   -She has now the freestyle libre CGM device.   -She was helped with the application in the room today.  Hopefully that will help her and get better for proper monitoring of blood glucose for safe use of insulin.   -She is advised to continue Lantus at 100 units nightly, advised to increase her Humalog to 30 units 3 times a day with meals  for pre-meal BG readings of 90-151m/dl, plus patient specific correction dose for unexpected hyperglycemia above 1540mdl, associated with strict monitoring of glucose 4 times a day-before meals and at bedtime. -  Patient is warned not to take insulin without proper monitoring per orders. -Adjustment parameters are given for hypo and hyperglycemia in writing. -Patient is encouraged to call clinic for blood glucose levels less than 70 or above 300 mg /dl. -She will be switched to insulin U500 after she uses up her current supplies of Lantus and Humalog.  She will return in 3 weeks with all of her regular insulin to make a switch. -  She is advised to continue metformin 500 mg p.o. twice daily after breakfast and supper.   - she not a suitable candidate for incretin therapy given her chronic heavy smoking.   - Patient specific target  A1c;  LDL, HDL, Triglycerides, and  Waist Circumference were discussed in detail.  2) BP/HTN: Her blood pressure is not controlled to target.  She is advised to continue  her current blood pressure medications including hydrochlorothiazide 25 mg by mouth daily, lisinopril 40 mg by mouth daily, amlodipine 10 mg by mouth daily.  She remains a heavy smoker, advised to consider quitting.   3) Lipids/HPL: Her recent lipid panel showed improved LDL at 104.  She is advised to continue atorvastatin 20 mg p.o. nightly.     4)  Weight/Diet: CDE Consult will be initiated , exercise, and detailed carbohydrates information provided.  5) Chronic Care/Health Maintenance:  -she  is on ACEI medications and  is encouraged to continue to follow up with Ophthalmology, Dentist,  Podiatrist at least yearly or according to recommendations, and advised to  quit smoking. I have recommended yearly flu vaccine and pneumonia vaccination at least every 5  years; moderate intensity exercise for up to 150 minutes weekly; and  sleep for at least 7 hours a day. -She is tentatively counseled for smoking cessation.    - I advised patient to maintain close follow up with Rosita Fire, MD for primary care needs.  - Time spent with the patient: 25 min, of which >50% was spent in reviewing her  current and   previous labs/studies, previous treatments, and medications doses and developing a plan for long-term care based on the latest recommendations for standards of care. Please refer to " Patient Self Inventory" in the Media  tab for reviewed elements of pertinent patient history.  Kari Miles participated in the discussions, expressed understanding, and voiced agreement with the above plans.  All questions were answered to her satisfaction. she is encouraged to contact clinic should she have any questions or concerns prior to her return visit.   Follow up plan: Return in 3 months with repeat labs, meter and logs. Glade Lloyd, MD [facility] [address] [place], [place] [address] Phone: [phone]  Fax: [phone]    04/07/2019, 11:50 AM  This note was partially dictated with voice recognition software. Similar sounding words can be transcribed inadequately or may not  be corrected upon review.

## 2019-04-14 DIAGNOSIS — Z03818 Encounter for observation for suspected exposure to other biological agents ruled out: Secondary | ICD-10-CM | POA: Diagnosis not present

## 2019-05-01 ENCOUNTER — Ambulatory Visit: Payer: Medicare Other | Admitting: "Endocrinology

## 2019-05-09 ENCOUNTER — Ambulatory Visit: Payer: Medicare Other | Admitting: "Endocrinology

## 2019-05-10 ENCOUNTER — Encounter: Payer: Self-pay | Admitting: "Endocrinology

## 2019-05-10 ENCOUNTER — Ambulatory Visit (INDEPENDENT_AMBULATORY_CARE_PROVIDER_SITE_OTHER): Payer: Medicare Other | Admitting: "Endocrinology

## 2019-05-10 ENCOUNTER — Other Ambulatory Visit: Payer: Self-pay

## 2019-05-10 VITALS — BP 136/87 | HR 106 | Ht 64.0 in | Wt 180.0 lb

## 2019-05-10 DIAGNOSIS — I10 Essential (primary) hypertension: Secondary | ICD-10-CM

## 2019-05-10 DIAGNOSIS — Z6832 Body mass index (BMI) 32.0-32.9, adult: Secondary | ICD-10-CM | POA: Diagnosis not present

## 2019-05-10 DIAGNOSIS — E1165 Type 2 diabetes mellitus with hyperglycemia: Secondary | ICD-10-CM | POA: Diagnosis not present

## 2019-05-10 DIAGNOSIS — E6609 Other obesity due to excess calories: Secondary | ICD-10-CM

## 2019-05-10 MED ORDER — HUMULIN R U-500 KWIKPEN 500 UNIT/ML ~~LOC~~ SOPN: 50.0000 [IU] | PEN_INJECTOR | Freq: Three times a day (TID) | SUBCUTANEOUS | 2 refills | Status: DC

## 2019-05-10 MED ORDER — GLIPIZIDE ER 5 MG PO TB24: 5.0000 mg | ORAL_TABLET | Freq: Every day | ORAL | 3 refills | Status: DC

## 2019-05-10 NOTE — Progress Notes (Signed)
Endocrinology follow-up note       05/10/2019, 12:33 PM   Subjective:    Patient ID: Kari Miles, female    DOB: 1961-05-06.  Kari Miles is being seen in follow-up  for management of currently uncontrolled symptomatic diabetes requested by  Rosita Fire, MD.   Past Medical History:  Diagnosis Date  . Arthritis   . Carpal tunnel syndrome   . Chronic back pain   . Diabetes mellitus without complication ([place])   . Hypertension   . Tendonitis    Past Surgical History:  Procedure Laterality Date  . ABDOMINAL HYSTERECTOMY    . CESAREAN SECTION    . ORTHOPEDIC SURGERY     Social History   Socioeconomic History  . Marital status: Widowed    Spouse name: Not on file  . Number of children: Not on file  . Years of education: Not on file  . Highest education level: Not on file  Occupational History  . Not on file  Social Needs  . Financial resource strain: Not on file  . Food insecurity    Worry: Not on file    Inability: Not on file  . Transportation needs    Medical: Not on file    Non-medical: Not on file  Tobacco Use  . Smoking status: Current Every Day Smoker    Packs/day: 1.00    Years: 30.00    Pack years: 30.00    Types: Cigarettes  . Smokeless tobacco: Never Used  Substance and Sexual Activity  . Alcohol use: No  . Drug use: No  . Sexual activity: Not on file  Lifestyle  . Physical activity    Days per week: Not on file    Minutes per session: Not on file  . Stress: Not on file  Relationships  . Social Herbalist on phone: Not on file    Gets together: Not on file    Attends religious service: Not on file    Active member of club or organization: Not on file    Attends meetings of clubs or organizations: Not on file    Relationship status: Not on file  Other Topics Concern  . Not on file  Social History Narrative  . Not on file    Family History   Problem Relation Age of Onset  . Seizures Mother   . Heart disease Father   . Cancer Sister   . Heart attack Sister   . Colon cancer Neg Hx    Outpatient Encounter Medications as of 05/10/2019  Medication Sig  . albuterol (PROVENTIL HFA;VENTOLIN HFA) 108 (90 Base) MCG/ACT inhaler Inhale 2 puffs into the lungs every 6 (six) hours as needed for wheezing or shortness of breath.  Marland Kitchen amLODipine (NORVASC) 10 MG tablet Take 10 mg by mouth daily.  Marland Kitchen aspirin EC 81 MG tablet Take 81 mg by mouth daily.  Marland Kitchen atorvastatin (LIPITOR) 20 MG tablet Take 20 mg by mouth daily.  . Blood Glucose Monitoring Suppl (ACCU-CHEK GUIDE) w/Device KIT 1 Piece by Does not apply route as directed. Test BG 4 x daily. E11.65  .  Continuous Blood Gluc Sensor (FREESTYLE LIBRE 14 DAY SENSOR) MISC Inject 1 each into the skin every 14 (fourteen) days. Use as directed.  . diphenhydrAMINE (BENADRYL) 25 MG tablet Take 25 mg by mouth daily as needed for allergies.  Marland Kitchen glipiZIDE (GLUCOTROL XL) 5 MG 24 hr tablet Take 1 tablet (5 mg total) by mouth daily with breakfast.  . glucose blood test strip 1 each by Other route 4 (four) times daily. Use as instructed 4 x daily. E11.65 True Track  . hydrochlorothiazide (HYDRODIURIL) 25 MG tablet Take 25 mg by mouth daily.  Marland Kitchen ibuprofen (ADVIL,MOTRIN) 200 MG tablet Take 400 mg by mouth every 6 (six) hours as needed for headache or moderate pain.  . Ibuprofen-diphenhydrAMINE Cit (IBUPROFEN PM) 200-38 MG TABS Take 1 tablet by mouth at bedtime as needed (sleep).  . insulin regular human CONCENTRATED (HUMULIN R U-500 KWIKPEN) 500 UNIT/ML kwikpen Inject 50 Units into the skin 3 (three) times daily before meals.  Marland Kitchen lisinopril (ZESTRIL) 40 MG tablet Take 40 mg by mouth daily.  . metFORMIN (GLUCOPHAGE) 500 MG tablet TAKE ONE TABLET BY MOUTH TWICE DAILY. TAKE WITH A MEAL. (Patient taking differently: Take 500 mg by mouth 2 (two) times daily with a meal. )  . omeprazole (PRILOSEC) 20 MG capsule 1 PO 30 MINS  PRIOR TO BREAKFAST. (Patient taking differently: Take 20 mg by mouth daily before breakfast. )  . Polyethylene Glycol 400 (BLINK TEARS) 0.25 % SOLN Place 1 drop into both eyes every 6 (six) hours as needed (dry eyes).   . [DISCONTINUED] Insulin Lispro (HUMALOG KWIKPEN ) Inject 3,036 Units into the skin 3 (three) times daily before meals.  . [DISCONTINUED] LANTUS SOLOSTAR 100 UNIT/ML Solostar Pen INJECT 100 UNITS SUBCUTANEOUSLY AT BEDTIME.   No facility-administered encounter medications on file as of 05/10/2019.     ALLERGIES: No Known Allergies  VACCINATION STATUS:  There is no immunization history on file for this patient.  Diabetes She presents for her follow-up diabetic visit. She has type 2 diabetes mellitus. Onset time: she was diagnosed at approximate age of 86 years. Her disease course has been worsening. There are no hypoglycemic associated symptoms. Pertinent negatives for hypoglycemia include no confusion, headaches, pallor or seizures. Associated symptoms include blurred vision, fatigue, polydipsia and polyuria. Pertinent negatives for diabetes include no chest pain and no polyphagia. There are no hypoglycemic complications. Symptoms are worsening. There are no diabetic complications. Risk factors for coronary artery disease include diabetes mellitus, dyslipidemia, family history, hypertension, obesity, sedentary lifestyle, tobacco exposure and post-menopausal. Current diabetic treatment includes insulin injections (She takes Lantus 70 units nightly, Humalog 15 units 3 times a day before meals). Her weight is fluctuating minimally. She is following a generally unhealthy diet. When asked about meal planning, she reported none. She has not had a previous visit with a dietitian. She never participates in exercise. Her home blood glucose trend is increasing steadily. Her breakfast blood glucose range is generally >200 mg/dl. Her lunch blood glucose range is generally >200 mg/dl. Her dinner  blood glucose range is generally >200 mg/dl. Her bedtime blood glucose range is generally >200 mg/dl. Her overall blood glucose range is >200 mg/dl. (She presents with her logs and CGM device, download shows average blood glucose of 299.  86% - very high, 14%- high.  0% time in range.) An ACE inhibitor/angiotensin II receptor blocker is being taken. She does not see a podiatrist.Eye exam is not current.  Hypertension This is a chronic problem. The current episode started  more than 1 year ago. The problem is uncontrolled. Associated symptoms include blurred vision. Pertinent negatives include no chest pain, headaches, palpitations or shortness of breath. Risk factors for coronary artery disease include diabetes mellitus, obesity, sedentary lifestyle, smoking/tobacco exposure and family history. Past treatments include ACE inhibitors. Compliance problems include psychosocial issues and diet.     Review of Systems  Constitutional: Positive for fatigue. Negative for chills, fever and unexpected weight change.  HENT: Negative for trouble swallowing and voice change.   Eyes: Positive for blurred vision. Negative for visual disturbance.  Respiratory: Negative for cough, shortness of breath and wheezing.   Cardiovascular: Negative for chest pain, palpitations and leg swelling.  Gastrointestinal: Negative for diarrhea, nausea and vomiting.  Endocrine: Positive for polydipsia and polyuria. Negative for cold intolerance, heat intolerance and polyphagia.  Musculoskeletal: Negative for arthralgias and myalgias.  Skin: Negative for color change, pallor, rash and wound.  Neurological: Negative for seizures and headaches.  Psychiatric/Behavioral: Negative for confusion and suicidal ideas.    Objective:    BP 136/87   Pulse (!) 106   Ht '5\' 4"'  (1.626 m)   Wt 180 lb (81.6 kg)   BMI 30.90 kg/m   Wt Readings from Last 3 Encounters:  05/10/19 180 lb (81.6 kg)  04/07/19 186 lb 9.6 oz (84.6 kg)  02/10/19 180  lb (81.6 kg)     Physical Exam  Constitutional: She is oriented to person, place, and time. She appears well-developed.  HENT:  Head: Normocephalic and atraumatic.  Eyes: EOM are normal.  Neck: Normal range of motion. Neck supple. No tracheal deviation present. No thyromegaly present.  Cardiovascular: Normal rate.  Pulmonary/Chest: Effort normal.  Abdominal: There is no abdominal tenderness. There is no guarding.  Musculoskeletal: Normal range of motion.        General: No edema.  Neurological: She is alert and oriented to person, place, and time. She has normal reflexes. No cranial nerve deficit. Coordination normal.  Skin: Skin is warm and dry. No rash noted. No erythema. No pallor.  Psychiatric: She has a normal mood and affect. Judgment normal.  Patient has a reluctant, unconcerned affect.    CMP ( most recent) CMP     Component Value Date/Time   NA 136 09/22/2017 1130   K 3.9 09/22/2017 1130   CL 99 09/22/2017 1130   CO2 32 09/22/2017 1130   GLUCOSE 225 (H) 09/22/2017 1130   BUN 16 07/25/2018   CREATININE 0.6 07/25/2018   CREATININE 0.56 09/22/2017 1130   CALCIUM 9.7 09/22/2017 1130   PROT 7.1 09/22/2017 1130   ALBUMIN 4.6 04/28/2013 1236   AST 27 09/22/2017 1130   ALT 48 (H) 09/22/2017 1130   ALKPHOS 77 04/28/2013 1236   BILITOT 0.6 09/22/2017 1130   GFRNONAA 104 09/22/2017 1130   GFRAA 120 09/22/2017 1130    Diabetic Labs (most recent): Lab Results  Component Value Date   HGBA1C 11.5 (A) 04/07/2019   HGBA1C 10.3 12/01/2018   HGBA1C 9.9 07/25/2018     Lipid Panel     Component Value Date/Time   CHOL 94 12/01/2018   TRIG 49 12/01/2018   HDL 39 12/01/2018   LDLCALC 46 12/01/2018      Assessment & Plan:   1. Uncontrolled type 2 diabetes mellitus with hyperglycemia ([place])  - Kari Miles has currently uncontrolled symptomatic type 2 DM since  58 years of age. -She returns with a log and her CGM device persistently above target glycemic profile. -  Recently,  her point-of-care A1c is 11.5% progressively increasing from 9.9%.    her diabetes is complicated by noncompliance/nonadherence, chronic heavy smoking and Kari Miles remains at a high risk for more acute and chronic complications which include CAD, CVA, CKD, retinopathy, and neuropathy. These are all discussed in detail with the patient.  - I have counseled her on diet management and weight loss, by adopting a carbohydrate restricted/protein rich diet.  - she  admits there is a room for improvement in her diet and drink choices. -  Suggestion is made for her to avoid simple carbohydrates  from her diet including Cakes, Sweet Desserts / Pastries, Ice Cream, Soda (diet and regular), Sweet Tea, Candies, Chips, Cookies, Sweet Pastries,  Store Bought Juices, Alcohol in Excess of  1-2 drinks a day, Artificial Sweeteners, Coffee Creamer, and "Sugar-free" Products. This will help patient to have stable blood glucose profile and potentially avoid unintended weight gain.   - I encouraged her to switch to  unprocessed or minimally processed complex starch and increased protein intake (animal or plant source), fruits, and vegetables.  - she is advised to stick to a routine mealtimes to eat 3 meals  a day and avoid unnecessary snacks ( to snack only to correct hypoglycemia).   - I have approached her with the following individualized plan to manage diabetes and patient agrees:   -Given her current and prevailing glycemic burden, she will continue to require intensive treatment with basal/bolus insulin probably with higher dose.   -She has now the freestyle libre CGM device.   -She will be switched to insulin U500 today. -I discussed and prescribed Humulin U500 to take 50 units 3 times daily AC for [place] blood glucose readings above 90 mg per DL. -She is urged to continue strict monitoring/documentation of blood glucose before meals and at bedtime. -He is advised to discontinue Lantus and  NovoLog at this time.  - Patient is warned not to take insulin without proper monitoring per orders. -Adjustment parameters are given for hypo and hyperglycemia in writing. -Patient is encouraged to call clinic for blood glucose levels less than 70 or above 300 mg /dl.  -  She is advised to continue metformin 500 mg p.o. twice daily after breakfast and supper.  -I discussed and added glipizide 5 mg p.o. p.o. daily at breakfast. - she not a suitable candidate for incretin therapy given her chronic heavy smoking.   - Patient specific target  A1c;  LDL, HDL, Triglycerides, and  Waist Circumference were discussed in detail.  2) BP/HTN: Blood pressure is controlled to target.  She is advised to continue  her current blood pressure medications including hydrochlorothiazide 25 mg by mouth daily, lisinopril 40 mg by mouth daily, amlodipine 10 mg by mouth daily.  She remains a heavy smoker, advised to consider quitting.   3) Lipids/HPL: Her recent lipid panel showed improved LDL at 104.  She is advised to continue atorvastatin 20 mg p.o. nightly.     4)  Weight/Diet: CDE Consult will be initiated , exercise, and detailed carbohydrates information provided.  5) Chronic Care/Health Maintenance:  -she  is on ACEI medications and  is encouraged to continue to follow up with Ophthalmology, Dentist,  Podiatrist at least yearly or according to recommendations, and advised to  quit smoking. I have recommended yearly flu vaccine and pneumonia vaccination at least every 5 years; moderate intensity exercise for up to 150 minutes weekly; and  sleep for at least 7 hours a  day. -She is extensively counseled for smoking cessation.    - I advised patient to maintain close follow up with Rosita Fire, MD for primary care needs.  - Time spent with the patient: 25 min, of which >50% was spent in reviewing her blood glucose logs , discussing her hypoglycemia and hyperglycemia episodes, reviewing her current and   previous labs / studies and medications  doses and developing a plan to avoid hypoglycemia and hyperglycemia. Please refer to Patient Instructions for Blood Glucose Monitoring and Insulin/Medications Dosing Guide"  in media tab for additional information. Please  also refer to " Patient Self Inventory" in the Media  tab for reviewed elements of pertinent patient history.  Kari Miles participated in the discussions, expressed understanding, and voiced agreement with the above plans.  All questions were answered to her satisfaction. she is encouraged to contact clinic should she have any questions or concerns prior to her return visit.  Follow up plan: Return in 3 months with repeat labs, meter and logs. Glade Lloyd, MD [facility] [address] [place], [place] [address] Phone: [phone]  Fax: [phone]    05/10/2019, 12:33 PM  This note was partially dictated with voice recognition software. Similar sounding words can be transcribed inadequately or may not  be corrected upon review.

## 2019-05-15 DIAGNOSIS — Z03818 Encounter for observation for suspected exposure to other biological agents ruled out: Secondary | ICD-10-CM | POA: Diagnosis not present

## 2019-05-23 ENCOUNTER — Ambulatory Visit: Payer: Medicare Other | Admitting: "Endocrinology

## 2019-06-05 ENCOUNTER — Encounter: Payer: Self-pay | Admitting: "Endocrinology

## 2019-06-05 ENCOUNTER — Ambulatory Visit (INDEPENDENT_AMBULATORY_CARE_PROVIDER_SITE_OTHER): Payer: Medicare Other | Admitting: "Endocrinology

## 2019-06-05 ENCOUNTER — Other Ambulatory Visit: Payer: Self-pay

## 2019-06-05 VITALS — BP 133/81 | HR 91 | Ht 64.0 in | Wt 182.0 lb

## 2019-06-05 DIAGNOSIS — E782 Mixed hyperlipidemia: Secondary | ICD-10-CM | POA: Insufficient documentation

## 2019-06-05 DIAGNOSIS — I10 Essential (primary) hypertension: Secondary | ICD-10-CM | POA: Diagnosis not present

## 2019-06-05 DIAGNOSIS — E1165 Type 2 diabetes mellitus with hyperglycemia: Secondary | ICD-10-CM

## 2019-06-05 LAB — POCT GLYCOSYLATED HEMOGLOBIN (HGB A1C): Hemoglobin A1C: 11.2 % — AB (ref 4.0–5.6)

## 2019-06-05 MED ORDER — HUMULIN R U-500 KWIKPEN 500 UNIT/ML ~~LOC~~ SOPN: 70.0000 [IU] | PEN_INJECTOR | Freq: Three times a day (TID) | SUBCUTANEOUS | 2 refills | Status: DC

## 2019-06-05 NOTE — Patient Instructions (Signed)

## 2019-06-05 NOTE — Progress Notes (Signed)
Endocrinology follow-up note       06/05/2019, 11:17 AM   Subjective:    Patient ID: Kari Miles, female    DOB: 13-Feb-1961.  Kari Miles is being seen in follow-up  for management of currently uncontrolled symptomatic diabetes requested by  Rosita Fire, MD.   Past Medical History:  Diagnosis Date  . Arthritis   . Carpal tunnel syndrome   . Chronic back pain   . Diabetes mellitus without complication ([place])   . Hypertension   . Tendonitis    Past Surgical History:  Procedure Laterality Date  . ABDOMINAL HYSTERECTOMY    . CESAREAN SECTION    . ORTHOPEDIC SURGERY     Social History   Socioeconomic History  . Marital status: Widowed    Spouse name: Not on file  . Number of children: Not on file  . Years of education: Not on file  . Highest education level: Not on file  Occupational History  . Not on file  Social Needs  . Financial resource strain: Not on file  . Food insecurity    Worry: Not on file    Inability: Not on file  . Transportation needs    Medical: Not on file    Non-medical: Not on file  Tobacco Use  . Smoking status: Current Every Day Smoker    Packs/day: 1.00    Years: 30.00    Pack years: 30.00    Types: Cigarettes  . Smokeless tobacco: Never Used  Substance and Sexual Activity  . Alcohol use: No  . Drug use: No  . Sexual activity: Not on file  Lifestyle  . Physical activity    Days per week: Not on file    Minutes per session: Not on file  . Stress: Not on file  Relationships  . Social Herbalist on phone: Not on file    Gets together: Not on file    Attends religious service: Not on file    Active member of club or organization: Not on file    Attends meetings of clubs or organizations: Not on file    Relationship status: Not on file  Other Topics Concern  . Not on file  Social History Narrative  . Not on file    Family History   Problem Relation Age of Onset  . Seizures Mother   . Heart disease Father   . Cancer Sister   . Heart attack Sister   . Colon cancer Neg Hx    Outpatient Encounter Medications as of 06/05/2019  Medication Sig  . albuterol (PROVENTIL HFA;VENTOLIN HFA) 108 (90 Base) MCG/ACT inhaler Inhale 2 puffs into the lungs every 6 (six) hours as needed for wheezing or shortness of breath.  Marland Kitchen amLODipine (NORVASC) 10 MG tablet Take 10 mg by mouth daily.  Marland Kitchen aspirin EC 81 MG tablet Take 81 mg by mouth daily.  Marland Kitchen atorvastatin (LIPITOR) 20 MG tablet Take 20 mg by mouth daily.  . Blood Glucose Monitoring Suppl (ACCU-CHEK GUIDE) w/Device KIT 1 Piece by Does not apply route as directed. Test BG 4 x daily. E11.65  .  Continuous Blood Gluc Sensor (FREESTYLE LIBRE 14 DAY SENSOR) MISC Inject 1 each into the skin every 14 (fourteen) days. Use as directed.  . diphenhydrAMINE (BENADRYL) 25 MG tablet Take 25 mg by mouth daily as needed for allergies.  Marland Kitchen glipiZIDE (GLUCOTROL XL) 5 MG 24 hr tablet Take 1 tablet (5 mg total) by mouth daily with breakfast.  . glucose blood test strip 1 each by Other route 4 (four) times daily. Use as instructed 4 x daily. E11.65 True Track  . hydrochlorothiazide (HYDRODIURIL) 25 MG tablet Take 25 mg by mouth daily.  Marland Kitchen ibuprofen (ADVIL,MOTRIN) 200 MG tablet Take 400 mg by mouth every 6 (six) hours as needed for headache or moderate pain.  . Ibuprofen-diphenhydrAMINE Cit (IBUPROFEN PM) 200-38 MG TABS Take 1 tablet by mouth at bedtime as needed (sleep).  . insulin regular human CONCENTRATED (HUMULIN R U-500 KWIKPEN) 500 UNIT/ML kwikpen Inject 70 Units into the skin 3 (three) times daily with meals.  Marland Kitchen lisinopril (ZESTRIL) 40 MG tablet Take 40 mg by mouth daily.  . metFORMIN (GLUCOPHAGE) 500 MG tablet TAKE ONE TABLET BY MOUTH TWICE DAILY. TAKE WITH A MEAL. (Patient taking differently: Take 500 mg by mouth 2 (two) times daily with a meal. )  . omeprazole (PRILOSEC) 20 MG capsule 1 PO 30 MINS PRIOR  TO BREAKFAST. (Patient taking differently: Take 20 mg by mouth daily before breakfast. )  . Polyethylene Glycol 400 (BLINK TEARS) 0.25 % SOLN Place 1 drop into both eyes every 6 (six) hours as needed (dry eyes).   . [DISCONTINUED] insulin regular human CONCENTRATED (HUMULIN R U-500 KWIKPEN) 500 UNIT/ML kwikpen Inject 50 Units into the skin 3 (three) times daily before meals.   No facility-administered encounter medications on file as of 06/05/2019.     ALLERGIES: No Known Allergies  VACCINATION STATUS:  There is no immunization history on file for this patient.  Diabetes She presents for her follow-up diabetic visit. She has type 2 diabetes mellitus. Onset time: she was diagnosed at approximate age of 58 years. Her disease course has been worsening. There are no hypoglycemic associated symptoms. Pertinent negatives for hypoglycemia include no confusion, headaches, pallor or seizures. Associated symptoms include blurred vision, fatigue, polydipsia and polyuria. Pertinent negatives for diabetes include no chest pain and no polyphagia. There are no hypoglycemic complications. Symptoms are worsening. There are no diabetic complications. Risk factors for coronary artery disease include diabetes mellitus, dyslipidemia, family history, hypertension, obesity, sedentary lifestyle, tobacco exposure and post-menopausal. Current diabetic treatment includes insulin injections (She takes Lantus 70 units nightly, Humalog 15 units 3 times a day before meals). Her weight is fluctuating minimally. She is following a generally unhealthy diet. When asked about meal planning, she reported none. She has not had a previous visit with a dietitian. She never participates in exercise. Her home blood glucose trend is increasing steadily. Her breakfast blood glucose range is generally >200 mg/dl. Her lunch blood glucose range is generally >200 mg/dl. Her dinner blood glucose range is generally >200 mg/dl. Her bedtime blood  glucose range is generally >200 mg/dl. Her overall blood glucose range is >200 mg/dl. (She was recently switched to insulin U500 due to high insulin resistance.  She returns with persistent hyperglycemia above target she is on target range 11% of the times, 89% of the time she is above target.  No hypoglycemia.  Her point-of-care A1c is 11.2%.  ) An ACE inhibitor/angiotensin II receptor blocker is being taken. She does not see a podiatrist.Eye exam is not  current.  Hypertension This is a chronic problem. The current episode started more than 1 year ago. The problem is uncontrolled. Associated symptoms include blurred vision. Pertinent negatives include no chest pain, headaches, palpitations or shortness of breath. Risk factors for coronary artery disease include diabetes mellitus, obesity, sedentary lifestyle, smoking/tobacco exposure and family history. Past treatments include ACE inhibitors. Compliance problems include psychosocial issues and diet.     Review of Systems  Constitutional: Positive for fatigue. Negative for chills, fever and unexpected weight change.  HENT: Negative for trouble swallowing and voice change.   Eyes: Positive for blurred vision. Negative for visual disturbance.  Respiratory: Negative for cough, shortness of breath and wheezing.   Cardiovascular: Negative for chest pain, palpitations and leg swelling.  Gastrointestinal: Negative for diarrhea, nausea and vomiting.  Endocrine: Positive for polydipsia and polyuria. Negative for cold intolerance, heat intolerance and polyphagia.  Musculoskeletal: Negative for arthralgias and myalgias.  Skin: Negative for color change, pallor, rash and wound.  Neurological: Negative for seizures and headaches.  Psychiatric/Behavioral: Negative for confusion and suicidal ideas.    Objective:    BP 133/81   Pulse 91   Ht '5\' 4"'  (1.626 m)   Wt 182 lb (82.6 kg)   BMI 31.24 kg/m   Wt Readings from Last 3 Encounters:  06/05/19 182 lb  (82.6 kg)  05/10/19 180 lb (81.6 kg)  04/07/19 186 lb 9.6 oz (84.6 kg)     Physical Exam  Constitutional: She is oriented to person, place, and time. She appears well-developed.  HENT:  Head: Normocephalic and atraumatic.  Eyes: EOM are normal.  Neck: Normal range of motion. Neck supple. No tracheal deviation present. No thyromegaly present.  Cardiovascular: Normal rate.  Pulmonary/Chest: Effort normal.  Abdominal: There is no abdominal tenderness. There is no guarding.  Musculoskeletal: Normal range of motion.        General: No edema.  Neurological: She is alert and oriented to person, place, and time. She has normal reflexes. No cranial nerve deficit. Coordination normal.  Skin: Skin is warm and dry. No rash noted. No erythema. No pallor.  Psychiatric: She has a normal mood and affect. Judgment normal.  Patient has a reluctant, unconcerned affect.    CMP ( most recent) CMP     Component Value Date/Time   NA 136 09/22/2017 1130   K 3.9 09/22/2017 1130   CL 99 09/22/2017 1130   CO2 32 09/22/2017 1130   GLUCOSE 225 (H) 09/22/2017 1130   BUN 16 07/25/2018   CREATININE 0.6 07/25/2018   CREATININE 0.56 09/22/2017 1130   CALCIUM 9.7 09/22/2017 1130   PROT 7.1 09/22/2017 1130   ALBUMIN 4.6 04/28/2013 1236   AST 27 09/22/2017 1130   ALT 48 (H) 09/22/2017 1130   ALKPHOS 77 04/28/2013 1236   BILITOT 0.6 09/22/2017 1130   GFRNONAA 104 09/22/2017 1130   GFRAA 120 09/22/2017 1130    Diabetic Labs (most recent): Lab Results  Component Value Date   HGBA1C 11.5 (A) 04/07/2019   HGBA1C 10.3 12/01/2018   HGBA1C 9.9 07/25/2018     Lipid Panel     Component Value Date/Time   CHOL 94 12/01/2018   TRIG 49 12/01/2018   HDL 39 12/01/2018   LDLCALC 46 12/01/2018      Assessment & Plan:   1. Uncontrolled type 2 diabetes mellitus with hyperglycemia ([place])  - Kari Miles has currently uncontrolled symptomatic type 2 DM since  58 years of age. -She returns with persistent  hyperglycemia, average blood glucose is 234 on her CGM device.   -Her point-of-care A1c is 11.2%.   her diabetes is complicated by noncompliance/nonadherence, chronic heavy smoking and Kari Miles remains at a high risk for more acute and chronic complications which include CAD, CVA, CKD, retinopathy, and neuropathy. These are all discussed in detail with the patient.  - I have counseled her on diet management and weight loss, by adopting a carbohydrate restricted/protein rich diet.  - she  admits there is a room for improvement in her diet and drink choices. -  Suggestion is made for her to avoid simple carbohydrates  from her diet including Cakes, Sweet Desserts / Pastries, Ice Cream, Soda (diet and regular), Sweet Tea, Candies, Chips, Cookies, Sweet Pastries,  Store Bought Juices, Alcohol in Excess of  1-2 drinks a day, Artificial Sweeteners, Coffee Creamer, and "Sugar-free" Products. This will help patient to have stable blood glucose profile and potentially avoid unintended weight gain.   - I encouraged her to switch to  unprocessed or minimally processed complex starch and increased protein intake (animal or plant source), fruits, and vegetables.  - she is advised to stick to a routine mealtimes to eat 3 meals  a day and avoid unnecessary snacks ( to snack only to correct hypoglycemia).   - I have approached her with the following individualized plan to manage diabetes and patient agrees:   -Given her current and prevailing glycemic burden, she will continue to require intensive treatment with basal/bolus with a higher dose of insulin in order for her to achieve control of diabetes to target.   -She has now the freestyle libre CGM device.   -She will be switched to insulin U500 today. -She is advised to increase her Humulin  U500 to 70 units 3 times daily AC for [place] blood glucose readings above 90 mg per DL. -She is urged to continue strict monitoring/documentation of blood glucose  before meals and at bedtime.  - Patient is warned not to take insulin without proper monitoring per orders. -Adjustment parameters are given for hypo and hyperglycemia in writing. -Patient is encouraged to call clinic for blood glucose levels less than 70 or above 300 mg /dl.  -  She is advised to continue metformin 500 mg p.o. twice daily after breakfast and supper.  -She is also advised to continue glipizide 5 mg p.o. p.o. daily at breakfast. - she not a suitable candidate for incretin therapy given her chronic heavy smoking.   - Patient specific target  A1c;  LDL, HDL, Triglycerides, and  Waist Circumference were discussed in detail.  2) BP/HTN: Her blood pressure is controlled to target.  She is advised to continue  her current blood pressure medications including hydrochlorothiazide 25 mg by mouth daily, lisinopril 40 mg by mouth daily, amlodipine 10 mg by mouth daily.  She remains a heavy smoker, advised to consider quitting.   3) Lipids/HPL: Her recent lipid panel showed improved LDL at 104.  She is advised to continue atorvastatin 20 mg p.o. nightly.       4)  Weight/Diet: CDE Consult will be initiated , exercise, and detailed carbohydrates information provided.  5) Chronic Care/Health Maintenance:  -she  is on ACEI medications and  is encouraged to continue to follow up with Ophthalmology, Dentist,  Podiatrist at least yearly or according to recommendations, and advised to  quit smoking. I have recommended yearly flu vaccine and pneumonia vaccination at least every 5 years; moderate intensity exercise for up  to 150 minutes weekly; and  sleep for at least 7 hours a day. -The patient was counseled on the dangers of tobacco use, and was advised to quit.  Reviewed strategies to maximize success, including removing cigarettes and smoking materials from environment.   - I advised patient to maintain close follow up with Rosita Fire, MD for primary care needs.  - Time spent with the  patient: 25 min, of which >50% was spent in reviewing her blood glucose logs , discussing her hypoglycemia and hyperglycemia episodes, reviewing her current and  previous labs / studies and medications  doses and developing a plan to avoid hypoglycemia and hyperglycemia. Please refer to Patient Instructions for Blood Glucose Monitoring and Insulin/Medications Dosing Guide"  in media tab for additional information. Please  also refer to " Patient Self Inventory" in the Media  tab for reviewed elements of pertinent patient history.  Kari Miles participated in the discussions, expressed understanding, and voiced agreement with the above plans.  All questions were answered to her satisfaction. she is encouraged to contact clinic should she have any questions or concerns prior to her return visit.  Follow up plan: Return in 3 months with repeat labs, meter and logs. Glade Lloyd, MD [facility] [address] [place], [place] [address] Phone: [phone]  Fax: [phone]    06/05/2019, 11:17 AM  This note was partially dictated with voice recognition software. Similar sounding words can be transcribed inadequately or may not  be corrected upon review.

## 2019-06-21 ENCOUNTER — Other Ambulatory Visit: Payer: Self-pay

## 2019-06-26 ENCOUNTER — Other Ambulatory Visit: Payer: Self-pay

## 2019-06-26 ENCOUNTER — Other Ambulatory Visit (HOSPITAL_COMMUNITY): Payer: Medicare Other

## 2019-06-26 ENCOUNTER — Other Ambulatory Visit (HOSPITAL_COMMUNITY)
Admission: RE | Admit: 2019-06-26 | Discharge: 2019-06-26 | Disposition: A | Discharge status: Home / Self Care | Payer: Medicare Other | Source: Ambulatory Visit | Attending: Gastroenterology | Admitting: Gastroenterology

## 2019-06-27 ENCOUNTER — Telehealth: Payer: Self-pay | Admitting: *Deleted

## 2019-06-27 NOTE — Telephone Encounter (Signed)
Called pt and she said that she has a head cold and doesn't feel good right now.  She requested to call us back when ready to reschedule her procedure.

## 2019-06-27 NOTE — Telephone Encounter (Signed)
Noted  

## 2019-06-27 NOTE — Telephone Encounter (Signed)
Received call from endo that patient did not show up for covid testing yesterday for procedure tomorrow.

## 2019-06-28 ENCOUNTER — Ambulatory Visit (HOSPITAL_COMMUNITY): Admission: RE | Admit: 2019-06-28 | Payer: Medicare Other | Source: Home / Self Care | Admitting: Gastroenterology

## 2019-06-28 ENCOUNTER — Encounter (HOSPITAL_COMMUNITY): Admission: RE | Payer: Self-pay | Source: Home / Self Care

## 2019-06-28 SURGERY — COLONOSCOPY
Anesthesia: Moderate Sedation

## 2019-07-17 DIAGNOSIS — I10 Essential (primary) hypertension: Secondary | ICD-10-CM | POA: Diagnosis not present

## 2019-07-17 DIAGNOSIS — Z8249 Family history of ischemic heart disease and other diseases of the circulatory system: Secondary | ICD-10-CM | POA: Diagnosis not present

## 2019-07-27 DIAGNOSIS — I10 Essential (primary) hypertension: Secondary | ICD-10-CM | POA: Diagnosis not present

## 2019-07-27 DIAGNOSIS — E1140 Type 2 diabetes mellitus with diabetic neuropathy, unspecified: Secondary | ICD-10-CM | POA: Diagnosis not present

## 2019-08-06 DIAGNOSIS — Z20822 Contact with and (suspected) exposure to covid-19: Secondary | ICD-10-CM | POA: Diagnosis not present

## 2019-08-27 DIAGNOSIS — E1140 Type 2 diabetes mellitus with diabetic neuropathy, unspecified: Secondary | ICD-10-CM | POA: Diagnosis not present

## 2019-08-27 DIAGNOSIS — I10 Essential (primary) hypertension: Secondary | ICD-10-CM | POA: Diagnosis not present

## 2019-09-04 ENCOUNTER — Encounter: Payer: Self-pay | Admitting: "Endocrinology

## 2019-09-04 ENCOUNTER — Ambulatory Visit (INDEPENDENT_AMBULATORY_CARE_PROVIDER_SITE_OTHER): Payer: Medicare Other | Admitting: "Endocrinology

## 2019-09-04 ENCOUNTER — Other Ambulatory Visit: Payer: Self-pay

## 2019-09-04 VITALS — BP 129/84 | HR 94 | Ht 64.0 in | Wt 176.0 lb

## 2019-09-04 DIAGNOSIS — E782 Mixed hyperlipidemia: Secondary | ICD-10-CM | POA: Diagnosis not present

## 2019-09-04 DIAGNOSIS — E1165 Type 2 diabetes mellitus with hyperglycemia: Secondary | ICD-10-CM | POA: Diagnosis not present

## 2019-09-04 DIAGNOSIS — I10 Essential (primary) hypertension: Secondary | ICD-10-CM | POA: Diagnosis not present

## 2019-09-04 LAB — POCT GLYCOSYLATED HEMOGLOBIN (HGB A1C): Hemoglobin A1C: 11.1 % — AB (ref 4.0–5.6)

## 2019-09-04 MED ORDER — HUMULIN R U-500 KWIKPEN 500 UNIT/ML ~~LOC~~ SOPN: 80.0000 [IU] | PEN_INJECTOR | Freq: Three times a day (TID) | SUBCUTANEOUS | 2 refills | Status: DC

## 2019-09-04 NOTE — Progress Notes (Signed)
Endocrinology follow-up note       09/04/2019, 12:55 PM   Subjective:    Patient ID: Kari Miles, female    DOB: 10-02-1960.  Kari Miles is being seen in follow-up for management of chronically uncontrolled type 2 diabetes.  She also has hypertension, hyperlipidemia on treatment.    PMD:  Rosita Fire, MD.   Past Medical History:  Diagnosis Date  . Arthritis   . Carpal tunnel syndrome   . Chronic back pain   . Diabetes mellitus without complication ([place])   . Hypertension   . Tendonitis    Past Surgical History:  Procedure Laterality Date  . ABDOMINAL HYSTERECTOMY    . CESAREAN SECTION    . ORTHOPEDIC SURGERY     Social History   Socioeconomic History  . Marital status: Widowed    Spouse name: Not on file  . Number of children: Not on file  . Years of education: Not on file  . Highest education level: Not on file  Occupational History  . Not on file  Tobacco Use  . Smoking status: Current Every Day Smoker    Packs/day: 1.00    Years: 30.00    Pack years: 30.00    Types: Cigarettes  . Smokeless tobacco: Never Used  Substance and Sexual Activity  . Alcohol use: No  . Drug use: No  . Sexual activity: Not on file  Other Topics Concern  . Not on file  Social History Narrative  . Not on file   Social Determinants of Health   Financial Resource Strain:   . Difficulty of Paying Living Expenses: Not on file  Food Insecurity:   . Worried About Charity fundraiser in the Last Year: Not on file  . Ran Out of Food in the Last Year: Not on file  Transportation Needs:   . Lack of Transportation (Medical): Not on file  . Lack of Transportation (Non-Medical): Not on file  Physical Activity:   . Days of Exercise per Week: Not on file  . Minutes of Exercise per Session: Not on file  Stress:   . Feeling of Stress : Not on file  Social Connections:   . Frequency of Communication  with Friends and Family: Not on file  . Frequency of Social Gatherings with Friends and Family: Not on file  . Attends Religious Services: Not on file  . Active Member of Clubs or Organizations: Not on file  . Attends Archivist Meetings: Not on file  . Marital Status: Not on file    Family History  Problem Relation Age of Onset  . Seizures Mother   . Heart disease Father   . Cancer Sister   . Heart attack Sister   . Colon cancer Neg Hx    Outpatient Encounter Medications as of 09/04/2019  Medication Sig  . albuterol (PROVENTIL HFA;VENTOLIN HFA) 108 (90 Base) MCG/ACT inhaler Inhale 2 puffs into the lungs every 6 (six) hours as needed for wheezing or shortness of breath.  Marland Kitchen amLODipine (NORVASC) 10 MG tablet Take 10 mg by mouth daily.  Marland Kitchen aspirin EC 81 MG tablet  Take 81 mg by mouth daily.  Marland Kitchen atorvastatin (LIPITOR) 20 MG tablet Take 20 mg by mouth daily.  . Blood Glucose Monitoring Suppl (ACCU-CHEK GUIDE) w/Device KIT 1 Piece by Does not apply route as directed. Test BG 4 x daily. E11.65  . Continuous Blood Gluc Sensor (FREESTYLE LIBRE 14 DAY SENSOR) MISC Inject 1 each into the skin every 14 (fourteen) days. Use as directed.  . diphenhydrAMINE (BENADRYL) 25 MG tablet Take 25 mg by mouth daily as needed for allergies.  Marland Kitchen glipiZIDE (GLUCOTROL XL) 5 MG 24 hr tablet Take 1 tablet (5 mg total) by mouth daily with breakfast. (Patient not taking: Reported on 06/14/2019)  . glucose blood test strip 1 each by Other route 4 (four) times daily. Use as instructed 4 x daily. E11.65 True Track  . hydrochlorothiazide (HYDRODIURIL) 25 MG tablet Take 25 mg by mouth daily.  Marland Kitchen ibuprofen (ADVIL,MOTRIN) 200 MG tablet Take 400 mg by mouth every 6 (six) hours as needed for headache or moderate pain.  . Ibuprofen-diphenhydrAMINE Cit (IBUPROFEN PM) 200-38 MG TABS Take 1 tablet by mouth at bedtime as needed (sleep).  . insulin regular human CONCENTRATED (HUMULIN R U-500 KWIKPEN) 500 UNIT/ML kwikpen Inject 80  Units into the skin 3 (three) times daily with meals.  Marland Kitchen lisinopril (ZESTRIL) 40 MG tablet Take 40 mg by mouth daily.  . metFORMIN (GLUCOPHAGE) 500 MG tablet TAKE ONE TABLET BY MOUTH TWICE DAILY. TAKE WITH A MEAL. (Patient taking differently: Take 500 mg by mouth daily at 12 noon. )  . omeprazole (PRILOSEC) 20 MG capsule 1 PO 30 MINS PRIOR TO BREAKFAST. (Patient taking differently: Take 20 mg by mouth daily as needed (reflux). )  . Polyethylene Glycol 400 (BLINK TEARS) 0.25 % SOLN Place 1 drop into both eyes every 6 (six) hours as needed (dry eyes).   . [DISCONTINUED] insulin regular human CONCENTRATED (HUMULIN R U-500 KWIKPEN) 500 UNIT/ML kwikpen Inject 70 Units into the skin 3 (three) times daily with meals.   No facility-administered encounter medications on file as of 09/04/2019.    ALLERGIES: No Known Allergies  VACCINATION STATUS: Immunization History  Administered Date(s) Administered  . Influenza-Unspecified 08/28/2018    Diabetes She presents for her follow-up diabetic visit. She has type 2 diabetes mellitus. Onset time: she was diagnosed at approximate age of 66 years. Her disease course has been worsening. There are no hypoglycemic associated symptoms. Pertinent negatives for hypoglycemia include no confusion, headaches, pallor or seizures. Associated symptoms include blurred vision, fatigue, polydipsia and polyuria. Pertinent negatives for diabetes include no chest pain and no polyphagia. There are no hypoglycemic complications. Symptoms are worsening. There are no diabetic complications. Risk factors for coronary artery disease include diabetes mellitus, dyslipidemia, family history, hypertension, obesity, sedentary lifestyle, tobacco exposure and post-menopausal. Current diabetic treatment includes insulin injections (She takes Lantus 70 units nightly, Humalog 15 units 3 times a day before meals). Her weight is fluctuating minimally. She is following a generally unhealthy diet. When  asked about meal planning, she reported none. She has not had a previous visit with a dietitian. She never participates in exercise. Her home blood glucose trend is increasing steadily. Her breakfast blood glucose range is generally >200 mg/dl. Her lunch blood glucose range is generally >200 mg/dl. Her dinner blood glucose range is generally >200 mg/dl. Her bedtime blood glucose range is generally >200 mg/dl. Her overall blood glucose range is >200 mg/dl. (She was recently switched to insulin U500 due to high insulin resistance.  She is currently  on insulin U500 70 units 3 times daily AC.  She returns with her CGM device showing persistent hyperglycemia 95% of the time, time in range only 3% of the time.   Her average blood glucose is 302, point-of-care A1c today is 11.1%.   ) An ACE inhibitor/angiotensin II receptor blocker is being taken. She does not see a podiatrist.Eye exam is not current.  Hypertension This is a chronic problem. The current episode started more than 1 year ago. The problem is uncontrolled. Associated symptoms include blurred vision. Pertinent negatives include no chest pain, headaches, palpitations or shortness of breath. Risk factors for coronary artery disease include diabetes mellitus, obesity, sedentary lifestyle, smoking/tobacco exposure and family history. Past treatments include ACE inhibitors. Compliance problems include psychosocial issues and diet.     Review of Systems  Constitutional: Positive for fatigue. Negative for chills, fever and unexpected weight change.  HENT: Negative for trouble swallowing and voice change.   Eyes: Positive for blurred vision. Negative for visual disturbance.  Respiratory: Negative for cough, shortness of breath and wheezing.   Cardiovascular: Negative for chest pain, palpitations and leg swelling.  Gastrointestinal: Negative for diarrhea, nausea and vomiting.  Endocrine: Positive for polydipsia and polyuria. Negative for cold  intolerance, heat intolerance and polyphagia.  Musculoskeletal: Negative for arthralgias and myalgias.  Skin: Negative for color change, pallor, rash and wound.  Neurological: Negative for seizures and headaches.  Psychiatric/Behavioral: Negative for confusion and suicidal ideas.    Objective:    BP 129/84   Pulse 94   Ht _0  (1.626 m)   Wt 176 lb (79.8 kg)   BMI 30.21 kg/m   Wt Readings from Last 3 Encounters:  09/04/19 176 lb (79.8 kg)  06/05/19 182 lb (82.6 kg)  05/10/19 180 lb (81.6 kg)     Physical Exam  Constitutional: She is oriented to person, place, and time. She appears well-developed.  HENT:  Head: Normocephalic and atraumatic.  Eyes: EOM are normal.  Neck: No tracheal deviation present. No thyromegaly present.  Cardiovascular: Normal rate.  Pulmonary/Chest: Effort normal.  Abdominal: There is no abdominal tenderness. There is no guarding.  Musculoskeletal:        General: No edema. Normal range of motion.     Cervical back: Normal range of motion and neck supple.  Neurological: She is alert and oriented to person, place, and time. She has normal reflexes. No cranial nerve deficit. Coordination normal.  Skin: Skin is warm and dry. No rash noted. No erythema. No pallor.  Psychiatric: She has a normal mood and affect. Judgment normal.  Patient has a reluctant, unconcerned affect.    CMP ( most recent) CMP     Component Value Date/Time   NA 136 09/22/2017 1130   K 3.9 09/22/2017 1130   CL 99 09/22/2017 1130   CO2 32 09/22/2017 1130   GLUCOSE 225 (H) 09/22/2017 1130   BUN 16 07/25/2018 0000   CREATININE 0.6 07/25/2018 0000   CREATININE 0.56 09/22/2017 1130   CALCIUM 9.7 09/22/2017 1130   PROT 7.1 09/22/2017 1130   ALBUMIN 4.6 04/28/2013 1236   AST 27 09/22/2017 1130   ALT 48 (H) 09/22/2017 1130   ALKPHOS 77 04/28/2013 1236   BILITOT 0.6 09/22/2017 1130   GFRNONAA 104 09/22/2017 1130   GFRAA 120 09/22/2017 1130    Diabetic Labs (most recent): Lab  Results  Component Value Date   HGBA1C 11.2 (A) 06/05/2019   HGBA1C 11.5 (A) 04/07/2019   HGBA1C 10.3 12/01/2018  Lipid Panel     Component Value Date/Time   CHOL 94 12/01/2018 0000   TRIG 49 12/01/2018 0000   HDL 39 12/01/2018 0000   LDLCALC 46 12/01/2018 0000      Assessment & Plan:   1. Uncontrolled type 2 diabetes mellitus with hyperglycemia ([place])  - Kari Miles has currently uncontrolled symptomatic type 2 DM since  59 years of age.  She was recently switched to insulin U500 due to high insulin resistance.  She is currently on insulin U500 70 units 3 times daily AC.  She returns with her CGM device showing persistent hyperglycemia 95% of the time, time in range only 3% of the time.   Her average blood glucose is 302, point-of-care A1c today is 11.1%.   her diabetes is complicated by noncompliance/nonadherence, chronic heavy smoking and ELLYANA CRIGLER remains at a high risk for more acute and chronic complications which include CAD, CVA, CKD, retinopathy, and neuropathy. These are all discussed in detail with the patient.  - I have counseled her on diet management and weight loss, by adopting a carbohydrate restricted/protein rich diet.  - she  admits there is a room for improvement in her diet and drink choices.  She continues to consume large quantities of sweets and sweetened beverages. -  Suggestion is made for her to avoid simple carbohydrates  from her diet including Cakes, Sweet Desserts / Pastries, Ice Cream, Soda (diet and regular), Sweet Tea, Candies, Chips, Cookies, Sweet Pastries,  Store Bought Juices, Alcohol in Excess of  1-2 drinks a day, Artificial Sweeteners, Coffee Creamer, and "Sugar-free" Products. This will help patient to have stable blood glucose profile and potentially avoid unintended weight gain.   - I encouraged her to switch to  unprocessed or minimally processed complex starch and increased protein intake (animal or plant source), fruits, and  vegetables.  - she is advised to stick to a routine mealtimes to eat 3 meals  a day and avoid unnecessary snacks ( to snack only to correct hypoglycemia).   - I have approached her with the following individualized plan to manage diabetes and patient agrees:   -Given her current and prevailing glycemic burden, she will continue to require intensive treatment with multiple daily injections of insulin in order for her to achieve control of diabetes to target.    -She has now the freestyle libre CGM device.    -She is advised to increase her Humulin U500 to 80 units 3 times daily AC for Premeal blood glucose readings above 90 mg per DL. -She is urged to continue strict monitoring/documentation of blood glucose before meals and at bedtime.  - Patient is warned not to take insulin without proper monitoring per orders. -Adjustment parameters are given for hypo and hyperglycemia in writing. -Patient is encouraged to call clinic for blood glucose levels less than 70 or above 300 mg /dl.  -  She is advised to continue metformin 500 mg p.o. twice daily after breakfast and supper.  -She is also advised to continue glipizide 5 mg p.o. p.o. daily at breakfast. - she not a suitable candidate for incretin therapy given her chronic heavy smoking.   - Patient specific target  A1c;  LDL, HDL, Triglycerides, and  Waist Circumference were discussed in detail.  2) BP/HTN:  Her blood pressure is controlled to target.  She is advised to continue  her current blood pressure medications including hydrochlorothiazide 25 mg by mouth daily, lisinopril 40 mg by mouth daily, amlodipine  10 mg by mouth daily.  She remains a heavy smoker, advised to consider quitting.   3) Lipids/HPL: Her recent lipid panel showed improved LDL at 104.  She is advised to continue atorvastatin 20 mg p.o. nightly.  Side effects and precautions discussed with her.     4)  Weight/Diet: Her BMI [id number] complicating her diabetes care.  She  is a candidate for modest weight loss.  CDE Consult will be initiated , exercise, and detailed carbohydrates information provided.  5) Chronic Care/Health Maintenance:  -she  is on ACEI medications and  is encouraged to continue to follow up with Ophthalmology, Dentist,  Podiatrist at least yearly or according to recommendations, and advised to  quit smoking. I have recommended yearly flu vaccine and pneumonia vaccination at least every 5 years; moderate intensity exercise for up to 150 minutes weekly; and  sleep for at least 7 hours a day. -The patient was counseled on the dangers of tobacco use, and was advised to quit.  Reviewed strategies to maximize success, including removing cigarettes and smoking materials from environment.   - I advised patient to maintain close follow up with Rosita Fire, MD for primary care needs.  - Time spent on this patient care encounter:  35 min, of which > 50% was spent in  counseling and the rest reviewing her blood glucose logs , discussing her hypoglycemia and hyperglycemia episodes, reviewing her current and  previous labs / studies  ( including abstraction from other facilities) and medications  doses and developing a  long term treatment plan and documenting her care.   Please refer to Patient Instructions for Blood Glucose Monitoring and Insulin/Medications Dosing Guide"  in media tab for additional information. Please  also refer to " Patient Self Inventory" in the Media  tab for reviewed elements of pertinent patient history.  Kari Miles participated in the discussions, expressed understanding, and voiced agreement with the above plans.  All questions were answered to her satisfaction. she is encouraged to contact clinic should she have any questions or concerns prior to her return visit.   Follow up plan: Return in 3 months with repeat labs, meter and logs. Glade Lloyd, MD [facility] [address] [place], [place] [address] Phone: [phone]  Fax: [phone]    09/04/2019, 12:55 PM  This note was partially dictated with voice recognition software. Similar sounding words can be transcribed inadequately or may not  be corrected upon review.

## 2019-09-04 NOTE — Addendum Note (Signed)
Addended by: Derrell Lolling on: 09/04/2019 02:26 PM   Modules accepted: Orders

## 2019-09-04 NOTE — Patient Instructions (Signed)

## 2019-09-21 ENCOUNTER — Ambulatory Visit: Payer: Medicare Other | Attending: Internal Medicine

## 2019-09-21 DIAGNOSIS — Z23 Encounter for immunization: Secondary | ICD-10-CM

## 2019-09-21 NOTE — Progress Notes (Signed)
   Covid-19 Vaccination Clinic  Name:  Kari Miles    MRN: [id number] DOB: 09-22-1960  09/21/2019  [name] was observed post Covid-19 immunization for 15 minutes without incident. She was provided with Vaccine Information Sheet and instruction to access the V-Safe system.   [name] was instructed to call 911 with any severe reactions post vaccine: Marland Kitchen Difficulty breathing  . Swelling of face and throat  . A fast heartbeat  . A bad rash all over body  . Dizziness and weakness   Immunizations Administered    Name Date Dose VIS Date Route   Moderna COVID-19 Vaccine 09/21/2019 12:08 PM 0.5 mL 05/30/2019 Intramuscular   Manufacturer: Moderna   Lot: [id number]   NDC: 81771-165-79

## 2019-09-24 DIAGNOSIS — E1140 Type 2 diabetes mellitus with diabetic neuropathy, unspecified: Secondary | ICD-10-CM | POA: Diagnosis not present

## 2019-09-24 DIAGNOSIS — I10 Essential (primary) hypertension: Secondary | ICD-10-CM | POA: Diagnosis not present

## 2019-10-19 ENCOUNTER — Ambulatory Visit: Payer: Medicare Other | Attending: Internal Medicine

## 2019-10-19 DIAGNOSIS — Z23 Encounter for immunization: Secondary | ICD-10-CM

## 2019-10-19 NOTE — Progress Notes (Signed)
   Covid-19 Vaccination Clinic  Name:  NEITA LANDRIGAN    MRN: [id number] DOB: 1960-07-05  10/19/2019  Ms. Beste was observed post Covid-19 immunization for 15 minutes without incident. She was provided with Vaccine Information Sheet and instruction to access the V-Safe system.   Ms. Berk was instructed to call 911 with any severe reactions post vaccine: Marland Kitchen Difficulty breathing  . Swelling of face and throat  . A fast heartbeat  . A bad rash all over body  . Dizziness and weakness   Immunizations Administered    Name Date Dose VIS Date Route   Moderna COVID-19 Vaccine 10/19/2019 11:53 AM 0.5 mL 05/2019 Intramuscular   Manufacturer: Moderna   Lot: [id number]   NDC: 64332-951-88

## 2019-10-25 DIAGNOSIS — M170 Bilateral primary osteoarthritis of knee: Secondary | ICD-10-CM | POA: Diagnosis not present

## 2019-10-25 DIAGNOSIS — E1140 Type 2 diabetes mellitus with diabetic neuropathy, unspecified: Secondary | ICD-10-CM | POA: Diagnosis not present

## 2019-11-06 DIAGNOSIS — Z03818 Encounter for observation for suspected exposure to other biological agents ruled out: Secondary | ICD-10-CM | POA: Diagnosis not present

## 2019-11-17 DIAGNOSIS — Z03818 Encounter for observation for suspected exposure to other biological agents ruled out: Secondary | ICD-10-CM | POA: Diagnosis not present

## 2019-11-24 DIAGNOSIS — E1140 Type 2 diabetes mellitus with diabetic neuropathy, unspecified: Secondary | ICD-10-CM | POA: Diagnosis not present

## 2019-11-24 DIAGNOSIS — M170 Bilateral primary osteoarthritis of knee: Secondary | ICD-10-CM | POA: Diagnosis not present

## 2019-11-29 DIAGNOSIS — Z03818 Encounter for observation for suspected exposure to other biological agents ruled out: Secondary | ICD-10-CM | POA: Diagnosis not present

## 2019-12-01 DIAGNOSIS — Z03818 Encounter for observation for suspected exposure to other biological agents ruled out: Secondary | ICD-10-CM | POA: Diagnosis not present

## 2019-12-07 ENCOUNTER — Ambulatory Visit: Payer: Medicare Other | Admitting: "Endocrinology

## 2019-12-13 DIAGNOSIS — E1165 Type 2 diabetes mellitus with hyperglycemia: Secondary | ICD-10-CM | POA: Diagnosis not present

## 2019-12-13 DIAGNOSIS — B373 Candidiasis of vulva and vagina: Secondary | ICD-10-CM | POA: Diagnosis not present

## 2019-12-14 ENCOUNTER — Other Ambulatory Visit: Payer: Self-pay | Admitting: "Endocrinology

## 2019-12-15 DIAGNOSIS — Z03818 Encounter for observation for suspected exposure to other biological agents ruled out: Secondary | ICD-10-CM | POA: Diagnosis not present

## 2019-12-18 ENCOUNTER — Other Ambulatory Visit (HOSPITAL_COMMUNITY): Payer: Self-pay | Admitting: Internal Medicine

## 2019-12-18 ENCOUNTER — Other Ambulatory Visit: Payer: Self-pay | Admitting: Internal Medicine

## 2019-12-18 DIAGNOSIS — F17200 Nicotine dependence, unspecified, uncomplicated: Secondary | ICD-10-CM

## 2019-12-18 DIAGNOSIS — Z122 Encounter for screening for malignant neoplasm of respiratory organs: Secondary | ICD-10-CM

## 2019-12-18 DIAGNOSIS — Z87891 Personal history of nicotine dependence: Secondary | ICD-10-CM

## 2019-12-20 LAB — BASIC METABOLIC PANEL
BUN: 8 (ref 4–21)
Creatinine: 0.6 (ref 0.5–1.1)

## 2019-12-20 LAB — COMPREHENSIVE METABOLIC PANEL
GFR calc Af Amer: >90
GFR calc non Af Amer: >90

## 2019-12-20 LAB — TSH: TSH: 1.34 (ref 0.41–5.90)

## 2019-12-26 ENCOUNTER — Ambulatory Visit: Payer: Medicare Other | Admitting: "Endocrinology

## 2020-01-07 DIAGNOSIS — R112 Nausea with vomiting, unspecified: Secondary | ICD-10-CM | POA: Diagnosis not present

## 2020-01-07 DIAGNOSIS — E1165 Type 2 diabetes mellitus with hyperglycemia: Secondary | ICD-10-CM | POA: Diagnosis not present

## 2020-01-08 ENCOUNTER — Ambulatory Visit (HOSPITAL_COMMUNITY): Admission: RE | Admit: 2020-01-08 | Payer: Medicare Other | Source: Ambulatory Visit

## 2020-01-09 ENCOUNTER — Other Ambulatory Visit: Payer: Self-pay

## 2020-01-09 DIAGNOSIS — E1165 Type 2 diabetes mellitus with hyperglycemia: Secondary | ICD-10-CM

## 2020-01-09 MED ORDER — ALCOHOL SWABS 70 % PADS: MEDICATED_PAD | 5 refills | Status: AC

## 2020-01-18 DIAGNOSIS — I10 Essential (primary) hypertension: Secondary | ICD-10-CM | POA: Diagnosis not present

## 2020-01-18 DIAGNOSIS — E1140 Type 2 diabetes mellitus with diabetic neuropathy, unspecified: Secondary | ICD-10-CM | POA: Diagnosis not present

## 2020-01-26 ENCOUNTER — Encounter: Payer: Self-pay | Admitting: Nurse Practitioner

## 2020-01-26 ENCOUNTER — Other Ambulatory Visit: Payer: Self-pay

## 2020-01-26 ENCOUNTER — Ambulatory Visit (INDEPENDENT_AMBULATORY_CARE_PROVIDER_SITE_OTHER): Payer: Medicare Other | Admitting: Nurse Practitioner

## 2020-01-26 VITALS — BP 128/83 | HR 100 | Ht 64.0 in | Wt 181.2 lb

## 2020-01-26 DIAGNOSIS — I10 Essential (primary) hypertension: Secondary | ICD-10-CM | POA: Diagnosis not present

## 2020-01-26 DIAGNOSIS — E1165 Type 2 diabetes mellitus with hyperglycemia: Secondary | ICD-10-CM | POA: Diagnosis not present

## 2020-01-26 DIAGNOSIS — E782 Mixed hyperlipidemia: Secondary | ICD-10-CM

## 2020-01-26 LAB — POCT GLYCOSYLATED HEMOGLOBIN (HGB A1C): Hemoglobin A1C: 10.4 % — AB (ref 4.0–5.6)

## 2020-01-26 NOTE — Progress Notes (Addendum)
Endocrinology follow-up note       01/26/2020, 12:18 PM   Subjective:    Patient ID: Kari Miles, female    DOB: 1961-04-02.  Kari Miles is being seen in follow-up for management of chronically uncontrolled type 2 diabetes.  She also has hypertension, hyperlipidemia on treatment.    PMD:  Rosita Fire, MD.   Past Medical History:  Diagnosis Date  . Arthritis   . Carpal tunnel syndrome   . Chronic back pain   . Diabetes mellitus without complication ([place])   . Hypertension   . Tendonitis    Past Surgical History:  Procedure Laterality Date  . ABDOMINAL HYSTERECTOMY    . CESAREAN SECTION    . ORTHOPEDIC SURGERY     Social History   Socioeconomic History  . Marital status: Widowed    Spouse name: Not on file  . Number of children: Not on file  . Years of education: Not on file  . Highest education level: Not on file  Occupational History  . Not on file  Tobacco Use  . Smoking status: Current Every Day Smoker    Packs/day: 1.00    Years: 30.00    Pack years: 30.00    Types: Cigarettes  . Smokeless tobacco: Never Used  Vaping Use  . Vaping Use: Never used  Substance and Sexual Activity  . Alcohol use: No  . Drug use: No  . Sexual activity: Not on file  Other Topics Concern  . Not on file  Social History Narrative  . Not on file   Social Determinants of Health   Financial Resource Strain:   . Difficulty of Paying Living Expenses:   Food Insecurity:   . Worried About Charity fundraiser in the Last Year:   . Arboriculturist in the Last Year:   Transportation Needs:   . Film/video editor (Medical):   Marland Kitchen Lack of Transportation (Non-Medical):   Physical Activity:   . Days of Exercise per Week:   . Minutes of Exercise per Session:   Stress:   . Feeling of Stress :   Social Connections:   . Frequency of Communication with Friends and Family:   . Frequency of Social  Gatherings with Friends and Family:   . Attends Religious Services:   . Active Member of Clubs or Organizations:   . Attends Archivist Meetings:   Marland Kitchen Marital Status:     Family History  Problem Relation Age of Onset  . Seizures Mother   . Heart disease Father   . Cancer Sister   . Heart attack Sister   . Colon cancer Neg Hx    Outpatient Encounter Medications as of 01/26/2020  Medication Sig  . albuterol (PROVENTIL HFA;VENTOLIN HFA) 108 (90 Base) MCG/ACT inhaler Inhale 2 puffs into the lungs every 6 (six) hours as needed for wheezing or shortness of breath.  . Alcohol Swabs 70 % PADS Use as directed to clean skin prior to injections tid and checking blood glucose tid  . amLODipine (NORVASC) 10 MG tablet Take 10 mg by mouth daily.  Marland Kitchen aspirin EC  81 MG tablet Take 81 mg by mouth daily.  Marland Kitchen atorvastatin (LIPITOR) 20 MG tablet Take 20 mg by mouth daily.  . Blood Glucose Monitoring Suppl (ACCU-CHEK GUIDE) w/Device KIT 1 Piece by Does not apply route as directed. Test BG 4 x daily. E11.65  . Continuous Blood Gluc Sensor (FREESTYLE LIBRE 14 DAY SENSOR) MISC Inject 1 each into the skin every 14 (fourteen) days. Use as directed.  . diphenhydrAMINE (BENADRYL) 25 MG tablet Take 25 mg by mouth daily as needed for allergies.  Marland Kitchen glipiZIDE (GLUCOTROL XL) 5 MG 24 hr tablet Take 1 tablet (5 mg total) by mouth daily with breakfast.  . glucose blood test strip 1 each by Other route 4 (four) times daily. Use as instructed 4 x daily. E11.65 True Track  . hydrochlorothiazide (HYDRODIURIL) 25 MG tablet Take 25 mg by mouth daily.  . Ibuprofen-diphenhydrAMINE Cit (IBUPROFEN PM) 200-38 MG TABS Take 1 tablet by mouth at bedtime as needed (sleep).  . insulin regular human CONCENTRATED (HUMULIN R U-500 KWIKPEN) 500 UNIT/ML kwikpen Inject 80 Units into the skin 3 (three) times daily with meals.  Marland Kitchen lisinopril (ZESTRIL) 40 MG tablet Take 40 mg by mouth daily.  . metFORMIN (GLUCOPHAGE) 500 MG tablet TAKE ONE  TABLET BY MOUTH TWICE DAILY. TAKE WITH A MEAL. (Patient taking differently: Take 500 mg by mouth daily at 12 noon. )  . omeprazole (PRILOSEC) 20 MG capsule 1 PO 30 MINS PRIOR TO BREAKFAST. (Patient taking differently: Take 20 mg by mouth daily as needed (reflux). )  . Polyethylene Glycol 400 (BLINK TEARS) 0.25 % SOLN Place 1 drop into both eyes every 6 (six) hours as needed (dry eyes).   . [DISCONTINUED] ibuprofen (ADVIL,MOTRIN) 200 MG tablet Take 400 mg by mouth every 6 (six) hours as needed for headache or moderate pain.  Marland Kitchen ibuprofen (ADVIL) 800 MG tablet Take 800 mg by mouth 2 (two) times daily as needed.   No facility-administered encounter medications on file as of 01/26/2020.    ALLERGIES: No Known Allergies  VACCINATION STATUS: Immunization History  Administered Date(s) Administered  . Influenza-Unspecified 08/28/2018  . Moderna SARS-COVID-2 Vaccination 09/21/2019, 10/19/2019    Diabetes She presents for her follow-up diabetic visit. She has type 2 diabetes mellitus. Onset time: she was diagnosed at approximate age of 64 years. Her disease course has been improving. There are no hypoglycemic associated symptoms. Pertinent negatives for hypoglycemia include no headaches, pallor, seizures or tremors. Associated symptoms include polydipsia and polyuria. Pertinent negatives for diabetes include no chest pain and no polyphagia. There are no hypoglycemic complications. Symptoms are improving. There are no diabetic complications. Risk factors for coronary artery disease include diabetes mellitus, dyslipidemia, family history, hypertension, obesity, sedentary lifestyle, tobacco exposure and post-menopausal. Current diabetic treatment includes insulin injections (She takes Lantus 70 units nightly, Humalog 15 units 3 times a day before meals). She is compliant with treatment some of the time. Her weight is fluctuating minimally. She is following a generally unhealthy diet. When asked about meal  planning, she reported none. She has not had a previous visit with a dietitian. She never participates in exercise. Her home blood glucose trend is fluctuating minimally. Her breakfast blood glucose range is generally >200 mg/dl. Her lunch blood glucose range is generally >200 mg/dl. Her dinner blood glucose range is generally >200 mg/dl. Her bedtime blood glucose range is generally >200 mg/dl. Her overall blood glucose range is >200 mg/dl. (She reports to the clinic today with her CGM report and her glucose  logs showing above target fasting and postprandial glucose readings.  She has been to the ED twice for hyperglycemia complications since last visit.  Her A1c today is 10.4%, slightly improving from last A1c of 11.1 on 09/04/2019.  She states that the gaps in blood glucose monitoring are related to inadequate supply of CGM sensor.  She reports that she has a backup meter at home however she does not know if it still works or if she has strips for the machine. ) An ACE inhibitor/angiotensin II receptor blocker is being taken. She does not see a podiatrist.Eye exam is not current.  Hypertension This is a chronic problem. The current episode started more than 1 year ago. The problem is controlled. Pertinent negatives include no chest pain, headaches, palpitations or shortness of breath. Risk factors for coronary artery disease include diabetes mellitus, obesity, sedentary lifestyle, smoking/tobacco exposure, family history, dyslipidemia and stress. Past treatments include ACE inhibitors, calcium channel blockers and diuretics. The current treatment provides moderate improvement. Compliance problems include psychosocial issues and diet.     Review of Systems  Constitutional: Negative for chills and unexpected weight change.  Eyes: Negative for visual disturbance.  Respiratory: Negative for cough, shortness of breath and wheezing.   Cardiovascular: Negative for chest pain, palpitations and leg swelling.   Gastrointestinal: Negative for diarrhea, nausea and vomiting.  Endocrine: Positive for polydipsia and polyuria. Negative for cold intolerance, heat intolerance and polyphagia.  Musculoskeletal: Negative for arthralgias and myalgias.  Skin: Negative for color change, pallor, rash and wound.  Neurological: Negative for tremors, seizures and headaches.  All other systems reviewed and are negative.   Objective:    BP 128/83 (BP Location: Right Arm, Patient Position: Sitting)   Pulse 100   Ht 5' 4" (1.626 m)   Wt 181 lb 3.2 oz (82.2 kg)   BMI 31.10 kg/m   Wt Readings from Last 3 Encounters:  01/26/20 181 lb 3.2 oz (82.2 kg)  09/04/19 176 lb (79.8 kg)  06/05/19 182 lb (82.6 kg)      Physical Exam- Limited  Constitutional:  Body mass index is 31.1 kg/m. , not in acute distress, normal state of mind Eyes:  EOMI, no exophthalmos Neck: Supple Thyroid: No gross goiter Cardiovascular: RRR, no murmers, rubs, or gallops, no edema Respiratory: Adequate breathing efforts, no crackles, rales, rhonchi, or wheezing Musculoskeletal: no gross deformities, strength intact in all four extremities, no gross restriction of joint movements Skin:  no rashes, no hyperemia Neurological: no tremor with outstretched hands Psych: Has reluctant affect   Foot exam:   No rashes, ulcers, cuts, calluses, onychodystrophy.   Good pulses bilat.  Decreased sensation to 10 g monofilament bilat.   CMP ( most recent) CMP     Component Value Date/Time   NA 136 09/22/2017 1130   K 3.9 09/22/2017 1130   CL 99 09/22/2017 1130   CO2 32 09/22/2017 1130   GLUCOSE 225 (H) 09/22/2017 1130   BUN 8 12/20/2019 0000   CREATININE 0.6 12/20/2019 0000   CREATININE 0.56 09/22/2017 1130   CALCIUM 9.7 09/22/2017 1130   PROT 7.1 09/22/2017 1130   ALBUMIN 4.6 04/28/2013 1236   AST 27 09/22/2017 1130   ALT 48 (H) 09/22/2017 1130   ALKPHOS 77 04/28/2013 1236   BILITOT 0.6 09/22/2017 1130   GFRNONAA >90 12/20/2019  0000   GFRNONAA 104 09/22/2017 1130   GFRAA >90 12/20/2019 0000   GFRAA 120 09/22/2017 1130    Diabetic Labs (most recent): Lab Results  Component Value Date   HGBA1C 10.4 (A) 01/26/2020   HGBA1C 11.1 (A) 09/04/2019   HGBA1C 11.2 (A) 06/05/2019     Lipid Panel     Component Value Date/Time   CHOL 94 12/01/2018 0000   TRIG 49 12/01/2018 0000   HDL 39 12/01/2018 0000   LDLCALC 46 12/01/2018 0000      Assessment & Plan:   1. Uncontrolled type 2 diabetes mellitus with hyperglycemia ([place])  - Kari Miles has currently uncontrolled symptomatic type 2 DM since  59 years of age.  She reports to the clinic today with her CGM report and her glucose logs showing above target fasting and postprandial glucose readings.  She has been to the ED twice for hyperglycemia complications since last visit.  Her A1c today is 10.4%, slightly improving from last A1c of 11.1 on 09/04/2019.  She states that the gaps in blood glucose monitoring are related to inadequate supply of CGM sensor.  She reports that she has a backup meter at home however she does not know if it still works or if she has strips for the machine.  her diabetes is complicated by noncompliance/nonadherence, chronic heavy smoking and DAYLIN EADS remains at a high risk for more acute and chronic complications which include CAD, CVA, CKD, retinopathy, and neuropathy. These are all discussed in detail with the patient.  - I have counseled her on diet management and weight loss, by adopting a carbohydrate restricted/protein rich diet.  - The patient admits there is a room for improvement in their diet and drink choices. -  Suggestion is made for the patient to avoid simple carbohydrates from their diet including Cakes, Sweet Desserts / Pastries, Ice Cream, Soda (diet and regular), Sweet Tea, Candies, Chips, Cookies, Sweet Pastries,  Store Bought Juices, Alcohol in Excess of  1-2 drinks a day, Artificial Sweeteners, Coffee Creamer, and  "Sugar-free" Products. This will help patient to have stable blood glucose profile and potentially avoid unintended weight gain.   - I encouraged the patient to switch to  unprocessed or minimally processed complex starch and increased protein intake (animal or plant source), fruits, and vegetables.   - Patient is advised to stick to a routine mealtimes to eat 3 meals  a day and avoid unnecessary snacks ( to snack only to correct hypoglycemia).  - I have approached her with the following individualized plan to manage diabetes and patient agrees:   -Given her current and persisting glycemic burden, she will continue to require intensive treatment with multiple daily injections of insulin and oral therapies in order for her to achieve control of diabetes to target.  -She has now the freestyle libre CGM device.  She is advised to continue scanning at least 4 times a day before meals and at bedtime.  In times where she cannot obtain CGM sensor, she is advised to use backup meter and logs for documenting blood sugars.  -She is advised to increase her U500 insulin to 90 units 3 times daily with meals if glucose readings above 90 and eating.  Additionally she is advised to continue Metformin 500 mg p.o. twice daily with meals and glipizide 5 mg daily with breakfast.  - Patient is warned not to take insulin without proper monitoring per orders. -Adjustment parameters are given for hypo and hyperglycemia in writing. -Patient is encouraged to call clinic for blood glucose levels less than 70 or above 300 mg /dl.  -- she not a suitable candidate for incretin therapy  given her chronic heavy smoking.   - Patient specific target  A1c;  LDL, HDL, Triglycerides, and  Waist Circumference were discussed in detail.  2) BP/HTN:  Her blood pressure is controlled to target.  She is advised to continue Norvasc 10 mg p.o. daily, hydrochlorothiazide 25 mg p.o. daily, and lisinopril 40 mg p.o. daily.  She reports she is  still using tobacco.  Smoking cessation discussed with patient.  3) Lipids/HPL:  Her most recent lipid profile from 12/01/2018 was reviewed and showed LDL of 46.  She is advised to continue Lipitor 20 mg p.o. daily at bedtime.  4)  Weight/Diet:  Her BMI [id number] complicating her diabetes care.  She is a candidate for modest weight loss.  CDE Consult will be initiated , exercise, and detailed carbohydrates information provided.  5) Vitamin D Deficiency: Her previsit vitamin D levels are 8.5, consistent with significant deficiency.  Discussed and initiated treatment with ergocalciferol 50,000 units p.o. weekly x12 weeks.  6) Chronic Care/Health Maintenance:  -she  is on ACEI medications and statin is encouraged to continue to follow up with Ophthalmology, Dentist,  Podiatrist at least yearly or according to recommendations, and advised to  quit smoking. I have recommended yearly flu vaccine and pneumonia vaccination at least every 5 years; moderate intensity exercise for up to 150 minutes weekly; and  sleep for at least 7 hours a day. -The patient was counseled on the dangers of tobacco use, and was advised to quit.  Reviewed strategies to maximize success, including removing cigarettes and smoking materials from environment.   - I advised patient to maintain close follow up with Rosita Fire, MD for primary care needs.  - Time spent on this patient care encounter:  35 min, of which > 50% was spent in  counseling and the rest reviewing her blood glucose logs , discussing her hypoglycemia and hyperglycemia episodes, reviewing her current and  previous labs / studies  ( including abstraction from other facilities) and medications  doses and developing a  long term treatment plan and documenting her care.   Please refer to Patient Instructions for Blood Glucose Monitoring and Insulin/Medications Dosing Guide"  in media tab for additional information. Please  also refer to " Patient Self Inventory"  in the Media  tab for reviewed elements of pertinent patient history.  Kari Miles participated in the discussions, expressed understanding, and voiced agreement with the above plans.  All questions were answered to her satisfaction. she is encouraged to contact clinic should she have any questions or concerns prior to her return visit.    Follow up plan: Return in 3 months with repeat labs, meter and logs.   Rayetta Pigg, FNP-BC [facility] Phone: [phone] Fax: [phone]   01/26/2020, 12:18 PM  This note was partially dictated with voice recognition software. Similar sounding words can be transcribed inadequately or may not  be corrected upon review.

## 2020-01-26 NOTE — Patient Instructions (Signed)

## 2020-01-29 ENCOUNTER — Ambulatory Visit (HOSPITAL_COMMUNITY): Admission: RE | Admit: 2020-01-29 | Payer: Medicare Other | Source: Ambulatory Visit

## 2020-01-29 ENCOUNTER — Encounter (HOSPITAL_COMMUNITY): Payer: Self-pay

## 2020-02-12 ENCOUNTER — Other Ambulatory Visit: Payer: Self-pay | Admitting: "Endocrinology

## 2020-02-18 DIAGNOSIS — E1140 Type 2 diabetes mellitus with diabetic neuropathy, unspecified: Secondary | ICD-10-CM | POA: Diagnosis not present

## 2020-02-18 DIAGNOSIS — M170 Bilateral primary osteoarthritis of knee: Secondary | ICD-10-CM | POA: Diagnosis not present

## 2020-02-26 DIAGNOSIS — M25562 Pain in left knee: Secondary | ICD-10-CM | POA: Diagnosis not present

## 2020-02-26 DIAGNOSIS — F17210 Nicotine dependence, cigarettes, uncomplicated: Secondary | ICD-10-CM | POA: Diagnosis not present

## 2020-02-26 DIAGNOSIS — M79604 Pain in right leg: Secondary | ICD-10-CM | POA: Diagnosis not present

## 2020-02-26 DIAGNOSIS — I10 Essential (primary) hypertension: Secondary | ICD-10-CM | POA: Diagnosis not present

## 2020-02-26 DIAGNOSIS — E1142 Type 2 diabetes mellitus with diabetic polyneuropathy: Secondary | ICD-10-CM | POA: Diagnosis not present

## 2020-02-26 DIAGNOSIS — M1612 Unilateral primary osteoarthritis, left hip: Secondary | ICD-10-CM | POA: Diagnosis not present

## 2020-02-26 DIAGNOSIS — L84 Corns and callosities: Secondary | ICD-10-CM | POA: Diagnosis not present

## 2020-03-08 DIAGNOSIS — M79604 Pain in right leg: Secondary | ICD-10-CM | POA: Diagnosis not present

## 2020-03-08 DIAGNOSIS — M79606 Pain in leg, unspecified: Secondary | ICD-10-CM | POA: Diagnosis not present

## 2020-03-08 DIAGNOSIS — M79605 Pain in left leg: Secondary | ICD-10-CM | POA: Diagnosis not present

## 2020-03-28 DIAGNOSIS — I10 Essential (primary) hypertension: Secondary | ICD-10-CM | POA: Diagnosis not present

## 2020-03-28 DIAGNOSIS — E1140 Type 2 diabetes mellitus with diabetic neuropathy, unspecified: Secondary | ICD-10-CM | POA: Diagnosis not present

## 2020-04-26 DIAGNOSIS — H524 Presbyopia: Secondary | ICD-10-CM | POA: Diagnosis not present

## 2020-04-26 DIAGNOSIS — H40013 Open angle with borderline findings, low risk, bilateral: Secondary | ICD-10-CM | POA: Diagnosis not present

## 2020-04-26 LAB — HM DIABETES EYE EXAM

## 2020-04-27 DIAGNOSIS — I10 Essential (primary) hypertension: Secondary | ICD-10-CM | POA: Diagnosis not present

## 2020-04-27 DIAGNOSIS — E1140 Type 2 diabetes mellitus with diabetic neuropathy, unspecified: Secondary | ICD-10-CM | POA: Diagnosis not present

## 2020-04-29 ENCOUNTER — Ambulatory Visit: Payer: Medicare Other | Admitting: Nurse Practitioner

## 2020-04-30 ENCOUNTER — Encounter: Payer: Self-pay | Admitting: Nurse Practitioner

## 2020-04-30 ENCOUNTER — Other Ambulatory Visit: Payer: Self-pay

## 2020-04-30 ENCOUNTER — Ambulatory Visit (INDEPENDENT_AMBULATORY_CARE_PROVIDER_SITE_OTHER): Payer: Medicare Other | Admitting: Nurse Practitioner

## 2020-04-30 VITALS — BP 170/99 | HR 92 | Ht 64.0 in | Wt 181.0 lb

## 2020-04-30 DIAGNOSIS — E1165 Type 2 diabetes mellitus with hyperglycemia: Secondary | ICD-10-CM

## 2020-04-30 LAB — POCT GLYCOSYLATED HEMOGLOBIN (HGB A1C): Hemoglobin A1C: 10.9 % — AB (ref 4.0–5.6)

## 2020-04-30 MED ORDER — HUMULIN R U-500 KWIKPEN 500 UNIT/ML ~~LOC~~ SOPN: 100.0000 [IU] | PEN_INJECTOR | Freq: Three times a day (TID) | SUBCUTANEOUS | 3 refills | Status: DC

## 2020-04-30 MED ORDER — GLIPIZIDE ER 10 MG PO TB24: 10.0000 mg | ORAL_TABLET | Freq: Every day | ORAL | 3 refills | Status: DC

## 2020-04-30 NOTE — Progress Notes (Signed)
Endocrinology follow-up note       04/30/2020, 3:13 PM   Subjective:    Patient ID: Kari Miles, female    DOB: [birth year].  Kari Miles is being seen in follow-up for management of chronically uncontrolled type 2 diabetes.  She also has hypertension, hyperlipidemia on treatment.    PMD:  Rosita Fire, MD.   Past Medical History:  Diagnosis Date  . Arthritis   . Carpal tunnel syndrome   . Chronic back pain   . Diabetes mellitus without complication ([place])   . Hypertension   . Tendonitis    Past Surgical History:  Procedure Laterality Date  . ABDOMINAL HYSTERECTOMY    . CESAREAN SECTION    . ORTHOPEDIC SURGERY     Social History   Socioeconomic History  . Marital status: Widowed    Spouse name: Not on file  . Number of children: Not on file  . Years of education: Not on file  . Highest education level: Not on file  Occupational History  . Not on file  Tobacco Use  . Smoking status: Current Every Day Smoker    Packs/day: 1.00    Years: 30.00    Pack years: 30.00    Types: Cigarettes  . Smokeless tobacco: Never Used  Vaping Use  . Vaping Use: Never used  Substance and Sexual Activity  . Alcohol use: No  . Drug use: No  . Sexual activity: Not on file  Other Topics Concern  . Not on file  Social History Narrative  . Not on file   Social Determinants of Health   Financial Resource Strain:   . Difficulty of Paying Living Expenses: Not on file  Food Insecurity:   . Worried About Charity fundraiser in the Last Year: Not on file  . Ran Out of Food in the Last Year: Not on file  Transportation Needs:   . Lack of Transportation (Medical): Not on file  . Lack of Transportation (Non-Medical): Not on file  Physical Activity:   . Days of Exercise per Week: Not on file  . Minutes of Exercise per Session: Not on file  Stress:   . Feeling of Stress : Not on file  Social  Connections:   . Frequency of Communication with Friends and Family: Not on file  . Frequency of Social Gatherings with Friends and Family: Not on file  . Attends Religious Services: Not on file  . Active Member of Clubs or Organizations: Not on file  . Attends Archivist Meetings: Not on file  . Marital Status: Not on file    Family History  Problem Relation Age of Onset  . Seizures Mother   . Heart disease Father   . Cancer Sister   . Heart attack Sister   . Colon cancer Neg Hx    Outpatient Encounter Medications as of 04/30/2020  Medication Sig  . albuterol (PROVENTIL HFA;VENTOLIN HFA) 108 (90 Base) MCG/ACT inhaler Inhale 2 puffs into the lungs every 6 (six) hours as needed for wheezing or shortness of breath.  . Alcohol Swabs 70 % PADS Use as directed to  clean skin prior to injections tid and checking blood glucose tid  . amLODipine (NORVASC) 10 MG tablet Take 10 mg by mouth daily.  Marland Kitchen aspirin EC 81 MG tablet Take 81 mg by mouth daily.  Marland Kitchen atorvastatin (LIPITOR) 20 MG tablet Take 20 mg by mouth daily.  . Blood Glucose Monitoring Suppl (ACCU-CHEK GUIDE) w/Device KIT 1 Piece by Does not apply route as directed. Test BG 4 x daily. E11.65  . Continuous Blood Gluc Sensor (FREESTYLE LIBRE 14 DAY SENSOR) MISC Inject 1 each into the skin every 14 (fourteen) days. Use as directed.  . diphenhydrAMINE (BENADRYL) 25 MG tablet Take 25 mg by mouth daily as needed for allergies.  Marland Kitchen gabapentin (NEURONTIN) 300 MG capsule Take 300 mg by mouth 3 (three) times daily.  Marland Kitchen glipiZIDE (GLUCOTROL XL) 5 MG 24 hr tablet TAKE ONE TABLET BY MOUTH DAILY WITH BREAKFAST.  Marland Kitchen glucose blood test strip 1 each by Other route 4 (four) times daily. Use as instructed 4 x daily. E11.65 True Track  . hydrochlorothiazide (HYDRODIURIL) 25 MG tablet Take 25 mg by mouth daily.  Marland Kitchen ibuprofen (ADVIL) 800 MG tablet Take 800 mg by mouth 2 (two) times daily as needed.  . Ibuprofen-diphenhydrAMINE Cit (IBUPROFEN PM) 200-38 MG  TABS Take 1 tablet by mouth at bedtime as needed (sleep).  . insulin regular human CONCENTRATED (HUMULIN R U-500 KWIKPEN) 500 UNIT/ML kwikpen Inject 90 Units into the skin 3 (three) times daily with meals.  Marland Kitchen lisinopril (ZESTRIL) 40 MG tablet Take 40 mg by mouth daily.  . metFORMIN (GLUCOPHAGE) 500 MG tablet TAKE ONE TABLET BY MOUTH TWICE DAILY. TAKE WITH A MEAL. (Patient taking differently: Take 500 mg by mouth daily at 12 noon. )  . omeprazole (PRILOSEC) 20 MG capsule 1 PO 30 MINS PRIOR TO BREAKFAST. (Patient taking differently: Take 20 mg by mouth daily as needed (reflux). )  . Polyethylene Glycol 400 (BLINK TEARS) 0.25 % SOLN Place 1 drop into both eyes every 6 (six) hours as needed (dry eyes).    No facility-administered encounter medications on file as of 04/30/2020.    ALLERGIES: No Known Allergies  VACCINATION STATUS: Immunization History  Administered Date(s) Administered  . Influenza-Unspecified 08/28/2018  . Moderna SARS-COVID-2 Vaccination 09/21/2019, 10/19/2019    Diabetes She presents for her follow-up diabetic visit. She has type 2 diabetes mellitus. Onset time: she was diagnosed at approximate age of 59 years. Her disease course has been worsening. There are no hypoglycemic associated symptoms. Pertinent negatives for hypoglycemia include no headaches, pallor, seizures or tremors. Associated symptoms include foot paresthesias, polydipsia and polyuria. Pertinent negatives for diabetes include no chest pain and no polyphagia. There are no hypoglycemic complications. Symptoms are worsening. There are no diabetic complications. Risk factors for coronary artery disease include diabetes mellitus, dyslipidemia, family history, hypertension, obesity, sedentary lifestyle, tobacco exposure and post-menopausal. Current diabetic treatment includes intensive insulin program and oral agent (dual therapy). She is compliant with treatment some of the time. Her weight is fluctuating minimally. She  is following a generally unhealthy diet. When asked about meal planning, she reported none. She has not had a previous visit with a dietitian. She never participates in exercise. Her home blood glucose trend is increasing steadily. Her breakfast blood glucose range is generally >200 mg/dl. Her lunch blood glucose range is generally >200 mg/dl. Her dinner blood glucose range is generally >200 mg/dl. Her bedtime blood glucose range is generally >200 mg/dl. Her overall blood glucose range is >200 mg/dl. (She presents today  with her CGM and logs showing persistently high glucose levels both fasting and postprandial.  Her POCT A1C today is 10.9%, worsening from previous visit of 10.4%.  She assures me she is injecting her insulin as prescribed and taking her oral medications.  She endorses a poor diet including sugary beverages and candy.  Analysis of her CGM shows TAR 100% (97% in the very high range).  No episodes of hypoglycemia noted.  ) An ACE inhibitor/angiotensin II receptor blocker is being taken. She does not see a podiatrist.Eye exam is not current.  Hypertension This is a chronic problem. The current episode started more than 1 year ago. The problem has been waxing and waning since onset. The problem is uncontrolled. Pertinent negatives include no chest pain, headaches, palpitations or shortness of breath. Risk factors for coronary artery disease include diabetes mellitus, obesity, sedentary lifestyle, smoking/tobacco exposure, family history, dyslipidemia and stress. Past treatments include ACE inhibitors, calcium channel blockers and diuretics. The current treatment provides moderate improvement. Compliance problems include psychosocial issues and diet.     Review of systems  Constitutional: + Minimally fluctuating body weight,  current Body mass index is 31.07 kg/m. , + fatigue, no subjective hyperthermia, no subjective hypothermia Eyes: no blurry vision, no xerophthalmia ENT: no sore throat, no  nodules palpated in throat, no dysphagia/odynophagia, no hoarseness Cardiovascular: no chest pain, no shortness of breath, no palpitations, no leg swelling Respiratory: no cough, no shortness of breath Gastrointestinal: no nausea/vomiting/diarrhea Musculoskeletal: no muscle/joint aches Skin: no rashes, no hyperemia Neurological: no tremors, no dizziness, + numbness/tingling/cold sensation in bilateral feet. Psychiatric: no depression, no anxiety.   Objective:    BP (!) 170/99 (BP Location: Right Arm, Patient Position: Sitting)   Pulse 92   Ht '5\' 4"'  (1.626 m)   Wt 181 lb (82.1 kg)   BMI 31.07 kg/m   Wt Readings from Last 3 Encounters:  04/30/20 181 lb (82.1 kg)  01/26/20 181 lb 3.2 oz (82.2 kg)  09/04/19 176 lb (79.8 kg)    BP Readings from Last 3 Encounters:  04/30/20 (!) 170/99  01/26/20 128/83  09/04/19 129/84     Physical Exam- Limited  Constitutional:  Body mass index is 31.07 kg/m. , not in acute distress, normal state of mind Eyes:  EOMI, no exophthalmos Neck: Supple Thyroid: No gross goiter Cardiovascular: RRR, no murmers, rubs, or gallops, no edema Respiratory: Adequate breathing efforts, no crackles, rales, rhonchi, or wheezing Musculoskeletal: no gross deformities, strength intact in all four extremities, no gross restriction of joint movements Skin:  no rashes, no hyperemia Neurological: no tremor with outstretched hands Psych: Has reluctant affect     CMP ( most recent) CMP     Component Value Date/Time   NA 136 09/22/2017 1130   K 3.9 09/22/2017 1130   CL 99 09/22/2017 1130   CO2 32 09/22/2017 1130   GLUCOSE 225 (H) 09/22/2017 1130   BUN 8 12/20/2019 0000   CREATININE 0.6 12/20/2019 0000   CREATININE 0.56 09/22/2017 1130   CALCIUM 9.7 09/22/2017 1130   PROT 7.1 09/22/2017 1130   ALBUMIN 4.6 04/28/2013 1236   AST 27 09/22/2017 1130   ALT 48 (H) 09/22/2017 1130   ALKPHOS 77 04/28/2013 1236   BILITOT 0.6 09/22/2017 1130   GFRNONAA >90  12/20/2019 0000   GFRNONAA 104 09/22/2017 1130   GFRAA >90 12/20/2019 0000   GFRAA 120 09/22/2017 1130    Diabetic Labs (most recent): Lab Results  Component Value Date   HGBA1C 10.9 (  A) 04/30/2020   HGBA1C 10.4 (A) 01/26/2020   HGBA1C 11.1 (A) 09/04/2019     Lipid Panel     Component Value Date/Time   CHOL 94 12/01/2018 0000   TRIG 49 12/01/2018 0000   HDL 39 12/01/2018 0000   LDLCALC 46 12/01/2018 0000      Assessment & Plan:   1) Uncontrolled type 2 diabetes mellitus with hyperglycemia ([place])  - Niles has currently uncontrolled symptomatic type 2 DM since  60 years of age.  She presents today with her CGM and logs showing persistently high glucose levels both fasting and postprandial.  Her POCT A1C today is 10.9%, worsening from previous visit of 10.4%.  She assures me she is injecting her insulin as prescribed and taking her oral medications.  She endorses a poor diet including sugary beverages and candy.  Analysis of her CGM shows TAR 100% (97% in the very high range).  No episodes of hypoglycemia noted.   her diabetes is complicated by noncompliance/nonadherence, chronic heavy smoking and MARNELL MCDANIEL remains at a high risk for more acute and chronic complications which include CAD, CVA, CKD, retinopathy, and neuropathy. These are all discussed in detail with the patient.  - Nutritional counseling repeated at each appointment due to patients tendency to fall back in to old habits.  - The patient admits there is a room for improvement in their diet and drink choices. -  Suggestion is made for the patient to avoid simple carbohydrates from their diet including Cakes, Sweet Desserts / Pastries, Ice Cream, Soda (diet and regular), Sweet Tea, Candies, Chips, Cookies, Sweet Pastries,  Store Bought Juices, Alcohol in Excess of  1-2 drinks a day, Artificial Sweeteners, Coffee Creamer, and "Sugar-free" Products. This will help patient to have stable blood glucose profile  and potentially avoid unintended weight gain.   - I encouraged the patient to switch to  unprocessed or minimally processed complex starch and increased protein intake (animal or plant source), fruits, and vegetables.   - Patient is advised to stick to a routine mealtimes to eat 3 meals  a day and avoid unnecessary snacks ( to snack only to correct hypoglycemia).  - I have approached her with the following individualized plan to manage diabetes and patient agrees:   -Given her current and persisting glycemic burden, she will continue to require intensive treatment with multiple daily injections of insulin and oral therapies in order for her to achieve control of diabetes to target.  -She has now the freestyle libre CGM device.  She is advised to continue scanning at least 4 times a day before meals and at bedtime.  In times where she cannot obtain CGM sensor, she is advised to use backup meter and logs for documenting blood sugars.  -Along with dietary changes, she is advised to increase her U-500 to 100 units TID with meals if glucose readings are above 90 and she is eating.  She is advised to continue Metformin 500 mg po twice daily with meals.  I also discussed and increased her Glipizide to 10 mg XL Po daily with breakfast.  - Patient is warned not to take insulin without proper monitoring per orders. -Adjustment parameters are given for hypo and hyperglycemia in writing. -Patient is encouraged to call clinic for blood glucose levels less than 70 or above 300 mg /dl.   -- she not a suitable candidate for incretin therapy given her chronic heavy smoking.   - Patient specific target  A1c;  LDL, HDL, Triglycerides, and  Waist Circumference were discussed in detail.  2) BP/HTN:  Her blood pressure is not controlled to target.  She just smoked a cigarette prior to coming in to her appointment and she attributes the increase in her BP to that.  She is advised to continue Norvasc 10 mg po daily,  HCTZ 25 mg po daily, and Lisinopril 40 mg po daily.  She reports she has not yet tried to quit smoking.  Smoking cessation discussed with her today.  3) Lipids/HPL:  Her most recent lipid profile from 12/01/2018 was reviewed and showed LDL of 46.  She is advised to continue Lipitor 20 mg p.o. daily at bedtime.  Will recheck lipid panel prior to next visit.  4)  Weight/Diet: Her Body mass index is 31.07 kg/m.- clearly complicating her diabetes care.  She is a candidate for modest weight loss.  CDE Consult will be initiated , exercise, and detailed carbohydrates information provided.  She has missed her appointments with Jearld Fenton, RDE due to transportation issues.  I reordered nutrition consult.  5) Vitamin D Deficiency: Her previsit vitamin D levels are 8.5, consistent with significant deficiency.  She has completed replenishment with ergocalciferol.  Will consider rechecking Vitamin D level on subsequent visits.  6) Chronic Care/Health Maintenance: -she  is on ACEI medications and statin is encouraged to continue to follow up with Ophthalmology, Dentist,  Podiatrist at least yearly or according to recommendations, and advised to quit smoking. I have recommended yearly flu vaccine and pneumonia vaccination at least every 5 years; moderate intensity exercise for up to 150 minutes weekly; and  sleep for at least 7 hours a day.  -The patient was counseled on the dangers of tobacco use, and was advised to quit.  Reviewed strategies to maximize success, including removing cigarettes and smoking materials from environment.   - I advised patient to maintain close follow up with Rosita Fire, MD for primary care needs.  - Time spent on this patient care encounter:  35 min, of which > 50% was spent in  counseling and the rest reviewing her blood glucose logs , discussing her hypoglycemia and hyperglycemia episodes, reviewing her current and  previous labs / studies  ( including abstraction from other  facilities) and medications  doses and developing a  long term treatment plan and documenting her care.   Please refer to Patient Instructions for Blood Glucose Monitoring and Insulin/Medications Dosing Guide"  in media tab for additional information. Please  also refer to " Patient Self Inventory" in the Media  tab for reviewed elements of pertinent patient history.  Kari Miles participated in the discussions, expressed understanding, and voiced agreement with the above plans.  All questions were answered to her satisfaction. she is encouraged to contact clinic should she have any questions or concerns prior to her return visit.    Follow up plan: Return in about 2 weeks (around 05/14/2020) for Diabetes follow up, Bring glucometer and logs.   Rayetta Pigg, [facility] [address] [place], [place] [address] Phone: [phone] Fax: [phone]  04/30/2020, 3:13 PM

## 2020-04-30 NOTE — Patient Instructions (Signed)

## 2020-05-14 ENCOUNTER — Ambulatory Visit: Payer: Medicare Other | Admitting: Nurse Practitioner

## 2020-05-14 DIAGNOSIS — E1165 Type 2 diabetes mellitus with hyperglycemia: Secondary | ICD-10-CM | POA: Diagnosis not present

## 2020-05-14 DIAGNOSIS — E11641 Type 2 diabetes mellitus with hypoglycemia with coma: Secondary | ICD-10-CM | POA: Diagnosis not present

## 2020-05-14 LAB — LIPID PANEL
HDL: 42 (ref 35–70)
LDL Cholesterol: 114
Triglycerides: 68 (ref 40–160)

## 2020-05-14 LAB — TSH: TSH: 1.57 (ref 0.41–5.90)

## 2020-05-14 LAB — BASIC METABOLIC PANEL: Glucose: 397

## 2020-05-14 LAB — COMPREHENSIVE METABOLIC PANEL
Calcium: 10.1 (ref 8.7–10.7)
GFR calc Af Amer: >90

## 2020-05-20 ENCOUNTER — Encounter: Payer: Self-pay | Admitting: Nurse Practitioner

## 2020-05-20 ENCOUNTER — Other Ambulatory Visit: Payer: Self-pay

## 2020-05-20 ENCOUNTER — Ambulatory Visit (INDEPENDENT_AMBULATORY_CARE_PROVIDER_SITE_OTHER): Payer: Medicare Other | Admitting: Nurse Practitioner

## 2020-05-20 VITALS — BP 142/90 | HR 93 | Ht 64.0 in | Wt 177.6 lb

## 2020-05-20 DIAGNOSIS — E1165 Type 2 diabetes mellitus with hyperglycemia: Secondary | ICD-10-CM

## 2020-05-20 DIAGNOSIS — I10 Essential (primary) hypertension: Secondary | ICD-10-CM

## 2020-05-20 DIAGNOSIS — E782 Mixed hyperlipidemia: Secondary | ICD-10-CM

## 2020-05-20 MED ORDER — HUMULIN R U-500 KWIKPEN 500 UNIT/ML ~~LOC~~ SOPN: 110.0000 [IU] | PEN_INJECTOR | Freq: Three times a day (TID) | SUBCUTANEOUS | 3 refills | Status: DC

## 2020-05-20 MED ORDER — ATORVASTATIN CALCIUM 40 MG PO TABS: 40.0000 mg | ORAL_TABLET | Freq: Every day | ORAL | 3 refills | Status: DC

## 2020-05-20 NOTE — Progress Notes (Signed)
Endocrinology follow-up note       05/20/2020, 9:53 AM   Subjective:    Patient ID: Kari Miles, female    DOB: 21-Feb-1961.  Kari Miles is being seen in follow-up for management of chronically uncontrolled type 2 diabetes.  She also has hypertension, hyperlipidemia on treatment.    PMD:  Rosita Fire, MD.   Past Medical History:  Diagnosis Date  . Arthritis   . Carpal tunnel syndrome   . Chronic back pain   . Diabetes mellitus without complication ([place])   . Hypertension   . Tendonitis    Past Surgical History:  Procedure Laterality Date  . ABDOMINAL HYSTERECTOMY    . CESAREAN SECTION    . ORTHOPEDIC SURGERY     Social History   Socioeconomic History  . Marital status: Widowed    Spouse name: Not on file  . Number of children: Not on file  . Years of education: Not on file  . Highest education level: Not on file  Occupational History  . Not on file  Tobacco Use  . Smoking status: Current Every Day Smoker    Packs/day: 1.00    Years: 30.00    Pack years: 30.00    Types: Cigarettes  . Smokeless tobacco: Never Used  Vaping Use  . Vaping Use: Never used  Substance and Sexual Activity  . Alcohol use: No  . Drug use: No  . Sexual activity: Not on file  Other Topics Concern  . Not on file  Social History Narrative  . Not on file   Social Determinants of Health   Financial Resource Strain:   . Difficulty of Paying Living Expenses: Not on file  Food Insecurity:   . Worried About Charity fundraiser in the Last Year: Not on file  . Ran Out of Food in the Last Year: Not on file  Transportation Needs:   . Lack of Transportation (Medical): Not on file  . Lack of Transportation (Non-Medical): Not on file  Physical Activity:   . Days of Exercise per Week: Not on file  . Minutes of Exercise per Session: Not on file  Stress:   . Feeling of Stress : Not on file  Social  Connections:   . Frequency of Communication with Friends and Family: Not on file  . Frequency of Social Gatherings with Friends and Family: Not on file  . Attends Religious Services: Not on file  . Active Member of Clubs or Organizations: Not on file  . Attends Archivist Meetings: Not on file  . Marital Status: Not on file    Family History  Problem Relation Age of Onset  . Seizures Mother   . Heart disease Father   . Cancer Sister   . Heart attack Sister   . Colon cancer Neg Hx    Outpatient Encounter Medications as of 05/20/2020  Medication Sig  . albuterol (PROVENTIL HFA;VENTOLIN HFA) 108 (90 Base) MCG/ACT inhaler Inhale 2 puffs into the lungs every 6 (six) hours as needed for wheezing or shortness of breath.  . Alcohol Swabs 70 % PADS Use as directed to  clean skin prior to injections tid and checking blood glucose tid  . amLODipine (NORVASC) 10 MG tablet Take 10 mg by mouth daily.  Marland Kitchen aspirin EC 81 MG tablet Take 81 mg by mouth daily.  Marland Kitchen atorvastatin (LIPITOR) 40 MG tablet Take 1 tablet (40 mg total) by mouth daily.  . Blood Glucose Monitoring Suppl (ACCU-CHEK GUIDE) w/Device KIT 1 Piece by Does not apply route as directed. Test BG 4 x daily. E11.65  . Continuous Blood Gluc Sensor (FREESTYLE LIBRE 14 DAY SENSOR) MISC Inject 1 each into the skin every 14 (fourteen) days. Use as directed.  . diphenhydrAMINE (BENADRYL) 25 MG tablet Take 25 mg by mouth daily as needed for allergies.  Marland Kitchen gabapentin (NEURONTIN) 300 MG capsule Take 300 mg by mouth 3 (three) times daily.  Marland Kitchen glipiZIDE (GLUCOTROL XL) 10 MG 24 hr tablet Take 1 tablet (10 mg total) by mouth daily with breakfast.  . glucose blood test strip 1 each by Other route 4 (four) times daily. Use as instructed 4 x daily. E11.65 True Track  . hydrochlorothiazide (HYDRODIURIL) 25 MG tablet Take 25 mg by mouth daily.  Marland Kitchen ibuprofen (ADVIL) 800 MG tablet Take 800 mg by mouth 2 (two) times daily as needed.  .  Ibuprofen-diphenhydrAMINE Cit (IBUPROFEN PM) 200-38 MG TABS Take 1 tablet by mouth at bedtime as needed (sleep).  . insulin regular human CONCENTRATED (HUMULIN R U-500 KWIKPEN) 500 UNIT/ML kwikpen Inject 110 Units into the skin 3 (three) times daily with meals.  Marland Kitchen lisinopril (ZESTRIL) 40 MG tablet Take 40 mg by mouth daily.  . metFORMIN (GLUCOPHAGE) 500 MG tablet TAKE ONE TABLET BY MOUTH TWICE DAILY. TAKE WITH A MEAL. (Patient taking differently: Take 500 mg by mouth daily at 12 noon. )  . omeprazole (PRILOSEC) 20 MG capsule 1 PO 30 MINS PRIOR TO BREAKFAST. (Patient taking differently: Take 20 mg by mouth daily as needed (reflux). )  . Polyethylene Glycol 400 (BLINK TEARS) 0.25 % SOLN Place 1 drop into both eyes every 6 (six) hours as needed (dry eyes).   . [DISCONTINUED] atorvastatin (LIPITOR) 20 MG tablet Take 40 mg by mouth daily.  . [DISCONTINUED] insulin regular human CONCENTRATED (HUMULIN R U-500 KWIKPEN) 500 UNIT/ML kwikpen Inject 100 Units into the skin 3 (three) times daily with meals.   No facility-administered encounter medications on file as of 05/20/2020.    ALLERGIES: No Known Allergies  VACCINATION STATUS: Immunization History  Administered Date(s) Administered  . Influenza-Unspecified 08/28/2018  . Moderna SARS-COVID-2 Vaccination 09/21/2019, 10/19/2019    Diabetes She presents for her follow-up diabetic visit. She has type 2 diabetes mellitus. Onset time: she was diagnosed at approximate age of 47 years. Her disease course has been stable. There are no hypoglycemic associated symptoms. Pertinent negatives for hypoglycemia include no headaches, pallor, seizures or tremors. Associated symptoms include foot paresthesias, polydipsia and polyuria. Pertinent negatives for diabetes include no chest pain and no polyphagia. There are no hypoglycemic complications. Symptoms are stable. There are no diabetic complications. Risk factors for coronary artery disease include diabetes  mellitus, dyslipidemia, family history, hypertension, obesity, sedentary lifestyle, tobacco exposure and post-menopausal. Current diabetic treatment includes intensive insulin program and oral agent (dual therapy). She is compliant with treatment some of the time. Her weight is decreasing steadily. She is following a generally unhealthy diet. When asked about meal planning, she reported none. She has not had a previous visit with a dietitian. She never participates in exercise. There is no change in her home blood glucose  trend. Her breakfast blood glucose range is generally >200 mg/dl. Her lunch blood glucose range is generally >200 mg/dl. Her dinner blood glucose range is generally >200 mg/dl. Her bedtime blood glucose range is generally >200 mg/dl. Her overall blood glucose range is >200 mg/dl. (She presents today with her CGM and logs showing persistently high fasting and postprandial glycemic profile in the 300s.  Analysis of her CGM shows TIR 0%, TBR 0%, TAR 100%.  Her previous A1c was 10.9%.  She denies any hypoglycemia. ) An ACE inhibitor/angiotensin II receptor blocker is being taken. She does not see a podiatrist.Eye exam is not current.  Hypertension This is a chronic problem. The current episode started more than 1 year ago. The problem has been waxing and waning since onset. The problem is uncontrolled. Pertinent negatives include no chest pain, headaches, palpitations or shortness of breath. Risk factors for coronary artery disease include diabetes mellitus, obesity, sedentary lifestyle, smoking/tobacco exposure, family history, dyslipidemia and stress. Past treatments include ACE inhibitors, calcium channel blockers and diuretics. The current treatment provides moderate improvement. Compliance problems include psychosocial issues and diet.     Review of systems  Constitutional: + Minimally fluctuating body weight,  current Body mass index is 30.48 kg/m. , + fatigue, no subjective  hyperthermia, no subjective hypothermia Eyes: no blurry vision, no xerophthalmia ENT: no sore throat, no nodules palpated in throat, no dysphagia/odynophagia, no hoarseness Cardiovascular: no chest pain, no shortness of breath, no palpitations, no leg swelling Respiratory: no cough, no shortness of breath Gastrointestinal: no nausea/vomiting/diarrhea Musculoskeletal: no muscle/joint aches Skin: no rashes, no hyperemia Neurological: no tremors, no dizziness, + numbness/tingling/cold sensation in bilateral feet. Psychiatric: no depression, no anxiety.   Objective:    BP (!) 142/90   Pulse 93   Ht '5\' 4"'  (1.626 m)   Wt 177 lb 9.6 oz (80.6 kg)   BMI 30.48 kg/m   Wt Readings from Last 3 Encounters:  05/20/20 177 lb 9.6 oz (80.6 kg)  04/30/20 181 lb (82.1 kg)  01/26/20 181 lb 3.2 oz (82.2 kg)    BP Readings from Last 3 Encounters:  05/20/20 (!) 142/90  04/30/20 (!) 170/99  01/26/20 128/83     Physical Exam- Limited  Constitutional:  Body mass index is 30.48 kg/m. , not in acute distress, normal state of mind Eyes:  EOMI, no exophthalmos Neck: Supple Thyroid: No gross goiter Cardiovascular: RRR, no murmers, rubs, or gallops, no edema Respiratory: Adequate breathing efforts, no crackles, rales, rhonchi, or wheezing Musculoskeletal: no gross deformities, strength intact in all four extremities, no gross restriction of joint movements Skin:  no rashes, no hyperemia Neurological: no tremor with outstretched hands Psych: Has reluctant affect     CMP ( most recent) CMP     Component Value Date/Time   NA 136 09/22/2017 1130   K 3.9 09/22/2017 1130   CL 99 09/22/2017 1130   CO2 32 09/22/2017 1130   GLUCOSE 225 (H) 09/22/2017 1130   BUN 8 12/20/2019 0000   CREATININE 0.6 12/20/2019 0000   CREATININE 0.56 09/22/2017 1130   CALCIUM 10.1 05/14/2020 0000   PROT 7.1 09/22/2017 1130   ALBUMIN 4.6 04/28/2013 1236   AST 27 09/22/2017 1130   ALT 48 (H) 09/22/2017 1130    ALKPHOS 77 04/28/2013 1236   BILITOT 0.6 09/22/2017 1130   GFRNONAA >90 12/20/2019 0000   GFRNONAA 104 09/22/2017 1130   GFRAA >90 05/14/2020 0000   GFRAA 120 09/22/2017 1130    Diabetic Labs (most recent):  Lab Results  Component Value Date   HGBA1C 10.9 (A) 04/30/2020   HGBA1C 10.4 (A) 01/26/2020   HGBA1C 11.1 (A) 09/04/2019     Lipid Panel     Component Value Date/Time   CHOL 94 12/01/2018 0000   TRIG 68 05/14/2020 0000   HDL 42 05/14/2020 0000   LDLCALC 114 05/14/2020 0000      Assessment & Plan:   1) Uncontrolled type 2 diabetes mellitus with hyperglycemia ([place])  - [facility] has currently uncontrolled symptomatic type 2 DM since  59 years of age.  She presents today with her CGM and logs showing persistently high fasting and postprandial glycemic profile in the 300s.  Analysis of her CGM shows TIR 0%, TBR 0%, TAR 100%.  Her previous A1c was 10.9%.  She denies any hypoglycemia.  her diabetes is complicated by noncompliance/nonadherence, chronic heavy smoking and Kari Miles remains at a high risk for more acute and chronic complications which include CAD, CVA, CKD, retinopathy, and neuropathy. These are all discussed in detail with the patient.  - Nutritional counseling repeated at each appointment due to patients tendency to fall back in to old habits.  - The patient admits there is a room for improvement in their diet and drink choices. -  Suggestion is made for the patient to avoid simple carbohydrates from their diet including Cakes, Sweet Desserts / Pastries, Ice Cream, Soda (diet and regular), Sweet Tea, Candies, Chips, Cookies, Sweet Pastries,  Store Bought Juices, Alcohol in Excess of  1-2 drinks a day, Artificial Sweeteners, Coffee Creamer, and "Sugar-free" Products. This will help patient to have stable blood glucose profile and potentially avoid unintended weight gain.   - I encouraged the patient to switch to  unprocessed or minimally processed complex  starch and increased protein intake (animal or plant source), fruits, and vegetables.   - Patient is advised to stick to a routine mealtimes to eat 3 meals  a day and avoid unnecessary snacks ( to snack only to correct hypoglycemia).  - I have approached her with the following individualized plan to manage diabetes and patient agrees:   -Given her current and persisting glycemic burden, she will continue to require intensive treatment with multiple daily injections of insulin and oral therapies in order for her to achieve control of diabetes to target.  -She has now the freestyle libre CGM device.  She is advised to continue scanning at least 4 times a day before meals and at bedtime.  In times where she cannot obtain CGM sensor, she is advised to use backup meter and logs for documenting blood sugars.  -Along with dietary changes, she is advised to increase her U-500 to 110 units TID with meals if glucose readings are above 90 and she is eating.  She is advised to continue Metformin 500 mg po twice daily with meals and continue Glipizide to 10 mg XL Po daily with breakfast.  - Patient is warned not to take insulin without proper monitoring per orders. -Adjustment parameters are given for hypo and hyperglycemia in writing. -Patient is encouraged to call clinic for blood glucose levels less than 70 or above 300 mg /dl.   -- she not a suitable candidate for incretin therapy given her chronic heavy smoking.   - Patient specific target  A1c;  LDL, HDL, Triglycerides, and  Waist Circumference were discussed in detail.  2) BP/HTN:  Her blood pressure is still not controlled to target, but has improved since last visit.  She is advised to continue Norvasc 10 mg po daily, HCTZ 25 mg po daily, and Lisinopril 40 mg po daily.  3) Lipids/HPL:  Her most recent lipid panel from 05/14/20 shows uncontrolled LDL of 114 (worsening since last visit.  She is advised to increase her dose of Lipitor to 40 mg po  daily at bedtime.  Side effects and precautions discussed with her.  4)  Weight/Diet: Her Body mass index is 30.48 kg/m.- clearly complicating her diabetes care.  She is a candidate for modest weight loss.  CDE Consult will be initiated , exercise, and detailed carbohydrates information provided.  She has missed her appointments with Jearld Fenton, RDE due to transportation issues.  I reordered nutrition consult.  5) Chronic Care/Health Maintenance: -she  is on ACEI medications and statin is encouraged to continue to follow up with Ophthalmology, Dentist,  Podiatrist at least yearly or according to recommendations, and advised to quit smoking. I have recommended yearly flu vaccine and pneumonia vaccination at least every 5 years; moderate intensity exercise for up to 150 minutes weekly; and  sleep for at least 7 hours a day.  -The patient was counseled on the dangers of tobacco use, and was advised to quit.  Reviewed strategies to maximize success, including removing cigarettes and smoking materials from environment.   - I advised patient to maintain close follow up with Rosita Fire, MD for primary care needs.  - Time spent on this patient care encounter:  35 min, of which > 50% was spent in  counseling and the rest reviewing her blood glucose logs , discussing her hypoglycemia and hyperglycemia episodes, reviewing her current and  previous labs / studies  ( including abstraction from other facilities) and medications  doses and developing a  long term treatment plan and documenting her care.   Please refer to Patient Instructions for Blood Glucose Monitoring and Insulin/Medications Dosing Guide"  in media tab for additional information. Please  also refer to " Patient Self Inventory" in the Media  tab for reviewed elements of pertinent patient history.  Kari Miles participated in the discussions, expressed understanding, and voiced agreement with the above plans.  All questions were  answered to her satisfaction. she is encouraged to contact clinic should she have any questions or concerns prior to her return visit.    Follow up plan: Return in about 3 months (around 08/20/2020) for Diabetes follow up- A1c and urine micro in office, No previsit labs, Bring glucometer and logs.   Kari Miles, [facility] [address] [place], [place] [address] Phone: [phone] Fax: [phone]  05/20/2020, 9:53 AM

## 2020-05-20 NOTE — Patient Instructions (Signed)

## 2020-05-28 DIAGNOSIS — E1140 Type 2 diabetes mellitus with diabetic neuropathy, unspecified: Secondary | ICD-10-CM | POA: Diagnosis not present

## 2020-05-28 DIAGNOSIS — I10 Essential (primary) hypertension: Secondary | ICD-10-CM | POA: Diagnosis not present

## 2020-06-05 DIAGNOSIS — E119 Type 2 diabetes mellitus without complications: Secondary | ICD-10-CM | POA: Diagnosis not present

## 2020-06-13 DIAGNOSIS — H40003 Preglaucoma, unspecified, bilateral: Secondary | ICD-10-CM | POA: Diagnosis not present

## 2020-06-14 DIAGNOSIS — F17210 Nicotine dependence, cigarettes, uncomplicated: Secondary | ICD-10-CM | POA: Diagnosis not present

## 2020-06-14 DIAGNOSIS — Z1389 Encounter for screening for other disorder: Secondary | ICD-10-CM | POA: Diagnosis not present

## 2020-06-14 DIAGNOSIS — Z1331 Encounter for screening for depression: Secondary | ICD-10-CM | POA: Diagnosis not present

## 2020-06-14 DIAGNOSIS — I10 Essential (primary) hypertension: Secondary | ICD-10-CM | POA: Diagnosis not present

## 2020-06-14 DIAGNOSIS — E1142 Type 2 diabetes mellitus with diabetic polyneuropathy: Secondary | ICD-10-CM | POA: Diagnosis not present

## 2020-06-14 DIAGNOSIS — J410 Simple chronic bronchitis: Secondary | ICD-10-CM | POA: Diagnosis not present

## 2020-06-14 DIAGNOSIS — F17200 Nicotine dependence, unspecified, uncomplicated: Secondary | ICD-10-CM | POA: Diagnosis not present

## 2020-06-14 DIAGNOSIS — Z23 Encounter for immunization: Secondary | ICD-10-CM | POA: Diagnosis not present

## 2020-06-25 ENCOUNTER — Other Ambulatory Visit (HOSPITAL_COMMUNITY): Payer: Self-pay | Admitting: Internal Medicine

## 2020-06-25 ENCOUNTER — Other Ambulatory Visit: Payer: Self-pay | Admitting: Gerontology

## 2020-06-25 DIAGNOSIS — Z87891 Personal history of nicotine dependence: Secondary | ICD-10-CM

## 2020-06-28 DIAGNOSIS — E119 Type 2 diabetes mellitus without complications: Secondary | ICD-10-CM | POA: Diagnosis not present

## 2020-07-11 ENCOUNTER — Ambulatory Visit (HOSPITAL_COMMUNITY)
Admission: RE | Admit: 2020-07-11 | Discharge: 2020-07-11 | Disposition: A | Discharge status: Home / Self Care | Payer: Medicare Other | Source: Ambulatory Visit | Attending: Internal Medicine | Admitting: Internal Medicine

## 2020-07-11 DIAGNOSIS — Z87891 Personal history of nicotine dependence: Secondary | ICD-10-CM | POA: Diagnosis not present

## 2020-07-17 DIAGNOSIS — R921 Mammographic calcification found on diagnostic imaging of breast: Secondary | ICD-10-CM | POA: Diagnosis not present

## 2020-07-17 DIAGNOSIS — R922 Inconclusive mammogram: Secondary | ICD-10-CM | POA: Diagnosis not present

## 2020-07-23 DIAGNOSIS — E1140 Type 2 diabetes mellitus with diabetic neuropathy, unspecified: Secondary | ICD-10-CM | POA: Diagnosis not present

## 2020-07-23 DIAGNOSIS — G43909 Migraine, unspecified, not intractable, without status migrainosus: Secondary | ICD-10-CM | POA: Diagnosis not present

## 2020-07-23 DIAGNOSIS — I10 Essential (primary) hypertension: Secondary | ICD-10-CM | POA: Diagnosis not present

## 2020-07-24 DIAGNOSIS — U071 COVID-19: Secondary | ICD-10-CM | POA: Diagnosis not present

## 2020-07-24 DIAGNOSIS — Z20822 Contact with and (suspected) exposure to covid-19: Secondary | ICD-10-CM | POA: Diagnosis not present

## 2020-07-29 DIAGNOSIS — E119 Type 2 diabetes mellitus without complications: Secondary | ICD-10-CM | POA: Diagnosis not present

## 2020-08-19 DIAGNOSIS — Z8349 Family history of other endocrine, nutritional and metabolic diseases: Secondary | ICD-10-CM | POA: Diagnosis not present

## 2020-08-19 DIAGNOSIS — E1349 Other specified diabetes mellitus with other diabetic neurological complication: Secondary | ICD-10-CM | POA: Diagnosis not present

## 2020-08-20 ENCOUNTER — Ambulatory Visit (INDEPENDENT_AMBULATORY_CARE_PROVIDER_SITE_OTHER): Payer: Medicare Other | Admitting: Nurse Practitioner

## 2020-08-20 ENCOUNTER — Encounter: Payer: Self-pay | Admitting: Nurse Practitioner

## 2020-08-20 ENCOUNTER — Other Ambulatory Visit: Payer: Self-pay

## 2020-08-20 VITALS — BP 144/87 | HR 105 | Ht 64.0 in | Wt 186.2 lb

## 2020-08-20 DIAGNOSIS — E782 Mixed hyperlipidemia: Secondary | ICD-10-CM | POA: Diagnosis not present

## 2020-08-20 DIAGNOSIS — I10 Essential (primary) hypertension: Secondary | ICD-10-CM

## 2020-08-20 DIAGNOSIS — E1165 Type 2 diabetes mellitus with hyperglycemia: Secondary | ICD-10-CM | POA: Diagnosis not present

## 2020-08-20 LAB — POCT GLYCOSYLATED HEMOGLOBIN (HGB A1C): HbA1c, POC (controlled diabetic range): 10.7 % — AB (ref 0.0–7.0)

## 2020-08-20 LAB — POCT UA - MICROALBUMIN: Microalbumin Ur, POC: 10 mg/L

## 2020-08-20 MED ORDER — FREESTYLE LIBRE 14 DAY SENSOR MISC
1.0000 | 2 refills | Status: DC
Start: 2020-08-20 — End: 2021-03-19

## 2020-08-20 NOTE — Patient Instructions (Signed)

## 2020-08-20 NOTE — Progress Notes (Signed)
Endocrinology follow-up note       08/20/2020, 9:17 AM   Subjective:    Patient ID: Kari Miles, female    DOB: 1961-01-17.  Kari Miles is being seen in follow-up for management of chronically uncontrolled type 2 diabetes.  She also has hypertension, hyperlipidemia on treatment.    PMD:  Rosita Fire, MD.   Past Medical History:  Diagnosis Date  . Arthritis   . Carpal tunnel syndrome   . Chronic back pain   . Diabetes mellitus without complication ([place])   . Hypertension   . Tendonitis    Past Surgical History:  Procedure Laterality Date  . ABDOMINAL HYSTERECTOMY    . CESAREAN SECTION    . ORTHOPEDIC SURGERY     Social History   Socioeconomic History  . Marital status: Widowed    Spouse name: Not on file  . Number of children: Not on file  . Years of education: Not on file  . Highest education level: Not on file  Occupational History  . Not on file  Tobacco Use  . Smoking status: Current Every Day Smoker    Packs/day: 1.00    Years: 30.00    Pack years: 30.00    Types: Cigarettes  . Smokeless tobacco: Never Used  Vaping Use  . Vaping Use: Never used  Substance and Sexual Activity  . Alcohol use: No  . Drug use: No  . Sexual activity: Not on file  Other Topics Concern  . Not on file  Social History Narrative  . Not on file   Social Determinants of Health   Financial Resource Strain: Not on file  Food Insecurity: Not on file  Transportation Needs: Not on file  Physical Activity: Not on file  Stress: Not on file  Social Connections: Not on file    Family History  Problem Relation Age of Onset  . Seizures Mother   . Heart disease Father   . Cancer Sister   . Heart attack Sister   . Colon cancer Neg Hx    Outpatient Encounter Medications as of 08/20/2020  Medication Sig  . albuterol (PROVENTIL HFA;VENTOLIN HFA) 108 (90 Base) MCG/ACT inhaler Inhale 2 puffs into  the lungs every 6 (six) hours as needed for wheezing or shortness of breath.  . Alcohol Swabs 70 % PADS Use as directed to clean skin prior to injections tid and checking blood glucose tid  . amLODipine (NORVASC) 10 MG tablet Take 10 mg by mouth daily.  Marland Kitchen aspirin EC 81 MG tablet Take 81 mg by mouth daily.  Marland Kitchen atorvastatin (LIPITOR) 40 MG tablet Take 1 tablet (40 mg total) by mouth daily.  . Blood Glucose Monitoring Suppl (ACCU-CHEK GUIDE) w/Device KIT 1 Piece by Does not apply route as directed. Test BG 4 x daily. E11.65  . diphenhydrAMINE (BENADRYL) 25 MG tablet Take 25 mg by mouth daily as needed for allergies.  Marland Kitchen gabapentin (NEURONTIN) 300 MG capsule Take 300 mg by mouth 3 (three) times daily.  Marland Kitchen glipiZIDE (GLUCOTROL XL) 10 MG 24 hr tablet Take 1 tablet (10 mg total) by mouth daily with breakfast.  .  glucose blood test strip 1 each by Other route 4 (four) times daily. Use as instructed 4 x daily. E11.65 True Track  . hydrochlorothiazide (HYDRODIURIL) 25 MG tablet Take 25 mg by mouth daily.  Marland Kitchen ibuprofen (ADVIL) 800 MG tablet Take 800 mg by mouth 2 (two) times daily as needed.  . Ibuprofen-diphenhydrAMINE Cit 200-38 MG TABS Take 1 tablet by mouth at bedtime as needed (sleep).  . insulin regular human CONCENTRATED (HUMULIN R U-500 KWIKPEN) 500 UNIT/ML kwikpen Inject 110 Units into the skin 3 (three) times daily with meals.  Marland Kitchen lisinopril (ZESTRIL) 40 MG tablet Take 40 mg by mouth daily.  . metFORMIN (GLUCOPHAGE) 500 MG tablet TAKE ONE TABLET BY MOUTH TWICE DAILY. TAKE WITH A MEAL. (Patient taking differently: Take 500 mg by mouth daily at 12 noon.)  . omeprazole (PRILOSEC) 20 MG capsule 1 PO 30 MINS PRIOR TO BREAKFAST. (Patient taking differently: Take 20 mg by mouth daily as needed (reflux).)  . Polyethylene Glycol 400 0.25 % SOLN Place 1 drop into both eyes every 6 (six) hours as needed (dry eyes).   . [DISCONTINUED] Continuous Blood Gluc Sensor (FREESTYLE LIBRE 14 DAY SENSOR) MISC Inject 1 each  into the skin every 14 (fourteen) days. Use as directed.  . butalbital-acetaminophen-caffeine (FIORICET) 50-325-40 MG tablet TAKE 1 OR 2 TABLETS BY MOUTH EVERY 4 HOURS AS NEEDED  . Continuous Blood Gluc Sensor (FREESTYLE LIBRE 14 DAY SENSOR) MISC Inject 1 each into the skin every 14 (fourteen) days. Use as directed.  Elmore Guise Devices (SIMPLE DIAGNOSTICS LANCING DEV) MISC Use 1 device As directed To test your blood sugar  . spironolactone (ALDACTONE) 25 MG tablet Take 25 mg by mouth 2 (two) times daily.   No facility-administered encounter medications on file as of 08/20/2020.    ALLERGIES: No Known Allergies  VACCINATION STATUS: Immunization History  Administered Date(s) Administered  . Influenza-Unspecified 08/28/2018  . Moderna Sars-Covid-2 Vaccination 09/21/2019, 10/19/2019    Diabetes She presents for her follow-up diabetic visit. She has type 2 diabetes mellitus. Onset time: she was diagnosed at approximate age of 48 years. Her disease course has been improving. There are no hypoglycemic associated symptoms. Pertinent negatives for hypoglycemia include no headaches, pallor, seizures or tremors. Associated symptoms include foot paresthesias, polydipsia and polyuria. Pertinent negatives for diabetes include no chest pain and no polyphagia. There are no hypoglycemic complications. Symptoms are stable. There are no diabetic complications. Risk factors for coronary artery disease include diabetes mellitus, dyslipidemia, family history, hypertension, obesity, sedentary lifestyle, tobacco exposure and post-menopausal. Current diabetic treatment includes intensive insulin program and oral agent (dual therapy). She is compliant with treatment most of the time. Her weight is fluctuating dramatically. She is following a generally unhealthy diet. When asked about meal planning, she reported none. She has not had a previous visit with a dietitian. She never participates in exercise. There is no change in  her home blood glucose trend. Her breakfast blood glucose range is generally >200 mg/dl. Her lunch blood glucose range is generally >200 mg/dl. Her dinner blood glucose range is generally >200 mg/dl. Her bedtime blood glucose range is generally >200 mg/dl. Her overall blood glucose range is >200 mg/dl. (She presents today with her CGM and logs showing continued hyperglycemia both fasting and postprandially.  Her POCT A1c today is 10.7%, improving slightly from last visit of 10.9%.  She reports she still consumes soda, with no real plans to quit drinking them.  She has only been taking 100 units of the U-500  instead of the 110 recommended last visit.  She recently ran out of her libre sensors and has not been checking her glucose using a backup method.  There are no episodes of hypoglycemia reported or recorded.  Analysis of her CGM shows TIR 4%, TAR 96% (43% in the very high range), TBR 0%. ) An ACE inhibitor/angiotensin II receptor blocker is being taken. She does not see a podiatrist.Eye exam is not current.  Hypertension This is a chronic problem. The current episode started more than 1 year ago. The problem has been waxing and waning since onset. The problem is uncontrolled. Pertinent negatives include no chest pain, headaches, palpitations or shortness of breath. Risk factors for coronary artery disease include diabetes mellitus, obesity, sedentary lifestyle, smoking/tobacco exposure, family history, dyslipidemia and stress. Past treatments include ACE inhibitors, calcium channel blockers and diuretics. The current treatment provides moderate improvement. Compliance problems include psychosocial issues and diet.     Review of systems  Constitutional: + Minimally fluctuating body weight,  current Body mass index is 31.96 kg/m. , + fatigue, no subjective hyperthermia, no subjective hypothermia Eyes: no blurry vision, no xerophthalmia ENT: no sore throat, no nodules palpated in throat, no  dysphagia/odynophagia, no hoarseness Cardiovascular: no chest pain, no shortness of breath, no palpitations, no leg swelling Respiratory: no cough, no shortness of breath Gastrointestinal: no nausea/vomiting/diarrhea Musculoskeletal: no muscle/joint aches Skin: no rashes, no hyperemia Neurological: no tremors, no dizziness, + numbness/tingling/cold sensation in bilateral feet. Psychiatric: no depression, no anxiety.   Objective:    BP (!) 144/87 (BP Location: Left Arm, Patient Position: Sitting)   Pulse (!) 105   Ht '5\' 4"'  (1.626 m)   Wt 186 lb 3.2 oz (84.5 kg)   BMI 31.96 kg/m   Wt Readings from Last 3 Encounters:  08/20/20 186 lb 3.2 oz (84.5 kg)  05/20/20 177 lb 9.6 oz (80.6 kg)  04/30/20 181 lb (82.1 kg)    BP Readings from Last 3 Encounters:  08/20/20 (!) 144/87  05/20/20 (!) 142/90  04/30/20 (!) 170/99      Physical Exam- Limited  Constitutional:  Body mass index is 31.96 kg/m. , not in acute distress, normal state of mind, reluctant affect Eyes:  EOMI, no exophthalmos Neck: Supple Cardiovascular: RRR, no murmers, rubs, or gallops, no edema Respiratory: Adequate breathing efforts, no crackles, rales, rhonchi, or wheezing Musculoskeletal: no gross deformities, strength intact in all four extremities, no gross restriction of joint movements Skin:  no rashes, no hyperemia Neurological: no tremor with outstretched hands    CMP ( most recent) CMP     Component Value Date/Time   NA 136 09/22/2017 1130   K 3.9 09/22/2017 1130   CL 99 09/22/2017 1130   CO2 32 09/22/2017 1130   GLUCOSE 225 (H) 09/22/2017 1130   BUN 8 12/20/2019 0000   CREATININE 0.6 12/20/2019 0000   CREATININE 0.56 09/22/2017 1130   CALCIUM 10.1 05/14/2020 0000   PROT 7.1 09/22/2017 1130   ALBUMIN 4.6 04/28/2013 1236   AST 27 09/22/2017 1130   ALT 48 (H) 09/22/2017 1130   ALKPHOS 77 04/28/2013 1236   BILITOT 0.6 09/22/2017 1130   GFRNONAA >90 12/20/2019 0000   GFRNONAA 104 09/22/2017 1130    GFRAA >90 05/14/2020 0000   GFRAA 120 09/22/2017 1130    Diabetic Labs (most recent): Lab Results  Component Value Date   HGBA1C 10.7 (A) 08/20/2020   HGBA1C 10.9 (A) 04/30/2020   HGBA1C 10.4 (A) 01/26/2020     Lipid  Panel     Component Value Date/Time   CHOL 94 12/01/2018 0000   TRIG 68 05/14/2020 0000   HDL 42 05/14/2020 0000   LDLCALC 114 05/14/2020 0000      Assessment & Plan:   1) Uncontrolled type 2 diabetes mellitus with hyperglycemia ([place])  - [place] has currently uncontrolled symptomatic type 2 DM since  60 years of age.  She presents today with her CGM and logs showing continued hyperglycemia both fasting and postprandially.  Her POCT A1c today is 10.7%, improving slightly from last visit of 10.9%.  She reports she still consumes soda, with no real plans to quit drinking them.  She has only been taking 100 units of the U-500 instead of the 110 recommended last visit.  She recently ran out of her libre sensors and has not been checking her glucose using a backup method.  There are no episodes of hypoglycemia reported or recorded.  Analysis of her CGM shows TIR 4%, TAR 96% (43% in the very high range), TBR 0%.  her diabetes is complicated by noncompliance/nonadherence, chronic heavy smoking and Kari Miles remains at a high risk for more acute and chronic complications which include CAD, CVA, CKD, retinopathy, and neuropathy. These are all discussed in detail with the patient.  - Nutritional counseling repeated at each appointment due to patients tendency to fall back in to old habits.  - The patient admits there is a room for improvement in their diet and drink choices. -  Suggestion is made for the patient to avoid simple carbohydrates from their diet including Cakes, Sweet Desserts / Pastries, Ice Cream, Soda (diet and regular), Sweet Tea, Candies, Chips, Cookies, Sweet Pastries,  Store Bought Juices, Alcohol in Excess of  1-2 drinks a day, Artificial  Sweeteners, Coffee Creamer, and "Sugar-free" Products. This will help patient to have stable blood glucose profile and potentially avoid unintended weight gain.   - I encouraged the patient to switch to  unprocessed or minimally processed complex starch and increased protein intake (animal or plant source), fruits, and vegetables.   - Patient is advised to stick to a routine mealtimes to eat 3 meals  a day and avoid unnecessary snacks ( to snack only to correct hypoglycemia).  - I have approached her with the following individualized plan to manage diabetes and patient agrees:   -Given her current and persisting glycemic burden, she will continue to require intensive treatment with multiple daily injections of insulin and oral therapies in order for her to achieve control of diabetes to target.  -She has now the freestyle libre CGM device.  She is advised to continue scanning at least 4 times a day before meals and at bedtime, and to call the clinic if she has readings less than 70 or greater than 300 for 3 tests in a row.  In times where she cannot obtain CGM sensor, she is advised to use backup meter and logs for documenting blood sugars.  -Along with dietary changes, she is advised to increase her U-500 to 110 units TID with meals if glucose readings are above 90 and she is eating (she never increased to 110 units despite recommendations to do so at last visit).  She is advised to continue Metformin 500 mg po twice daily with meals and continue Glipizide to 10 mg XL Po daily with breakfast.  - Patient is warned not to take insulin without proper monitoring per orders. -Adjustment parameters are given for hypo and hyperglycemia  in writing.  -- she not a suitable candidate for incretin therapy given her chronic heavy smoking.   - Patient specific target  A1c;  LDL, HDL, Triglycerides, and  Waist Circumference were discussed in detail.  2) BP/HTN:  Her blood pressure is still not controlled to  target.  She is advised to continue Norvasc 10 mg po daily, HCTZ 25 mg po daily, and Lisinopril 40 mg po daily.  May consider dose change or addition of additional agent at next visit if BP remains elevated.  3) Lipids/HPL:  Her most recent lipid panel from 05/14/20 shows uncontrolled LDL of 114 (worsening since last visit.  She is advised to increase her dose of Lipitor to 40 mg po daily at bedtime.  Side effects and precautions discussed with her.  4)  Weight/Diet: Her Body mass index is 31.96 kg/m.- clearly complicating her diabetes care.  She is a candidate for modest weight loss.  CDE Consult will be initiated , exercise, and detailed carbohydrates information provided.  She has missed her appointments with Jearld Fenton, RDE due to transportation issues.  I reordered nutrition consult at last visit.  5) Chronic Care/Health Maintenance: -she  is on ACEI medications and statin is encouraged to continue to follow up with Ophthalmology, Dentist,  Podiatrist at least yearly or according to recommendations, and advised to quit smoking. I have recommended yearly flu vaccine and pneumonia vaccination at least every 5 years; moderate intensity exercise for up to 150 minutes weekly; and  sleep for at least 7 hours a day.  -The patient was counseled on the dangers of tobacco use, and was advised to quit.  Reviewed strategies to maximize success, including removing cigarettes and smoking materials from environment.  - I advised patient to maintain close follow up with Rosita Fire, MD for primary care needs.  - Time spent on this patient care encounter:  40 min, of which > 50% was spent in  counseling and the rest reviewing her blood glucose logs , discussing her hypoglycemia and hyperglycemia episodes, reviewing her current and  previous labs / studies  ( including abstraction from other facilities) and medications  doses and developing a  long term treatment plan and documenting her care.   Please  refer to Patient Instructions for Blood Glucose Monitoring and Insulin/Medications Dosing Guide"  in media tab for additional information. Please  also refer to " Patient Self Inventory" in the Media  tab for reviewed elements of pertinent patient history.  Kari Miles participated in the discussions, expressed understanding, and voiced agreement with the above plans.  All questions were answered to her satisfaction. she is encouraged to contact clinic should she have any questions or concerns prior to her return visit.    Follow up plan: Return in about 3 months (around 11/17/2020) for Diabetes follow up with A1c in office, Previsit labs, Bring glucometer and logs.   Rayetta Pigg, [facility] [address] [place], [place] [address] Phone: [phone] Fax: [phone]  08/20/2020, 9:17 AM

## 2020-08-23 DIAGNOSIS — I10 Essential (primary) hypertension: Secondary | ICD-10-CM | POA: Diagnosis not present

## 2020-08-23 DIAGNOSIS — J410 Simple chronic bronchitis: Secondary | ICD-10-CM | POA: Diagnosis not present

## 2020-08-23 DIAGNOSIS — E1140 Type 2 diabetes mellitus with diabetic neuropathy, unspecified: Secondary | ICD-10-CM | POA: Diagnosis not present

## 2020-08-23 DIAGNOSIS — F17200 Nicotine dependence, unspecified, uncomplicated: Secondary | ICD-10-CM | POA: Diagnosis not present

## 2020-09-20 DIAGNOSIS — I10 Essential (primary) hypertension: Secondary | ICD-10-CM | POA: Diagnosis not present

## 2020-09-20 DIAGNOSIS — E1140 Type 2 diabetes mellitus with diabetic neuropathy, unspecified: Secondary | ICD-10-CM | POA: Diagnosis not present

## 2020-09-26 DIAGNOSIS — E119 Type 2 diabetes mellitus without complications: Secondary | ICD-10-CM | POA: Diagnosis not present

## 2020-10-15 DIAGNOSIS — Z79899 Other long term (current) drug therapy: Secondary | ICD-10-CM | POA: Diagnosis not present

## 2020-10-15 DIAGNOSIS — R9431 Abnormal electrocardiogram [ECG] [EKG]: Secondary | ICD-10-CM | POA: Diagnosis not present

## 2020-10-15 DIAGNOSIS — E860 Dehydration: Secondary | ICD-10-CM | POA: Diagnosis not present

## 2020-10-15 DIAGNOSIS — Z20822 Contact with and (suspected) exposure to covid-19: Secondary | ICD-10-CM | POA: Diagnosis not present

## 2020-10-15 DIAGNOSIS — Z7984 Long term (current) use of oral hypoglycemic drugs: Secondary | ICD-10-CM | POA: Diagnosis not present

## 2020-10-15 DIAGNOSIS — Z794 Long term (current) use of insulin: Secondary | ICD-10-CM | POA: Diagnosis not present

## 2020-10-15 DIAGNOSIS — M47816 Spondylosis without myelopathy or radiculopathy, lumbar region: Secondary | ICD-10-CM | POA: Diagnosis not present

## 2020-10-15 DIAGNOSIS — E1165 Type 2 diabetes mellitus with hyperglycemia: Secondary | ICD-10-CM | POA: Diagnosis not present

## 2020-10-15 DIAGNOSIS — E7800 Pure hypercholesterolemia, unspecified: Secondary | ICD-10-CM | POA: Diagnosis not present

## 2020-10-15 DIAGNOSIS — R110 Nausea: Secondary | ICD-10-CM | POA: Diagnosis not present

## 2020-10-15 DIAGNOSIS — D72829 Elevated white blood cell count, unspecified: Secondary | ICD-10-CM | POA: Diagnosis not present

## 2020-10-15 DIAGNOSIS — N179 Acute kidney failure, unspecified: Secondary | ICD-10-CM | POA: Diagnosis not present

## 2020-10-15 DIAGNOSIS — R112 Nausea with vomiting, unspecified: Secondary | ICD-10-CM | POA: Diagnosis not present

## 2020-10-15 DIAGNOSIS — E278 Other specified disorders of adrenal gland: Secondary | ICD-10-CM | POA: Diagnosis not present

## 2020-10-15 DIAGNOSIS — R1013 Epigastric pain: Secondary | ICD-10-CM | POA: Diagnosis not present

## 2020-10-15 DIAGNOSIS — R1110 Vomiting, unspecified: Secondary | ICD-10-CM | POA: Diagnosis not present

## 2020-10-15 DIAGNOSIS — F17210 Nicotine dependence, cigarettes, uncomplicated: Secondary | ICD-10-CM | POA: Diagnosis not present

## 2020-10-15 DIAGNOSIS — R000 Tachycardia, unspecified: Secondary | ICD-10-CM | POA: Diagnosis not present

## 2020-10-15 DIAGNOSIS — I10 Essential (primary) hypertension: Secondary | ICD-10-CM | POA: Diagnosis not present

## 2020-10-15 DIAGNOSIS — R109 Unspecified abdominal pain: Secondary | ICD-10-CM | POA: Diagnosis not present

## 2020-10-15 DIAGNOSIS — N3289 Other specified disorders of bladder: Secondary | ICD-10-CM | POA: Diagnosis not present

## 2020-10-15 DIAGNOSIS — R262 Difficulty in walking, not elsewhere classified: Secondary | ICD-10-CM | POA: Diagnosis not present

## 2020-10-16 DIAGNOSIS — R638 Other symptoms and signs concerning food and fluid intake: Secondary | ICD-10-CM | POA: Diagnosis not present

## 2020-10-16 DIAGNOSIS — R7989 Other specified abnormal findings of blood chemistry: Secondary | ICD-10-CM | POA: Diagnosis not present

## 2020-10-16 DIAGNOSIS — E1165 Type 2 diabetes mellitus with hyperglycemia: Secondary | ICD-10-CM | POA: Diagnosis not present

## 2020-10-16 DIAGNOSIS — E860 Dehydration: Secondary | ICD-10-CM | POA: Diagnosis not present

## 2020-10-16 DIAGNOSIS — R112 Nausea with vomiting, unspecified: Secondary | ICD-10-CM | POA: Diagnosis not present

## 2020-10-16 DIAGNOSIS — Z794 Long term (current) use of insulin: Secondary | ICD-10-CM | POA: Diagnosis not present

## 2020-10-17 DIAGNOSIS — N179 Acute kidney failure, unspecified: Secondary | ICD-10-CM | POA: Diagnosis not present

## 2020-10-17 DIAGNOSIS — J90 Pleural effusion, not elsewhere classified: Secondary | ICD-10-CM | POA: Diagnosis not present

## 2020-10-17 DIAGNOSIS — E1165 Type 2 diabetes mellitus with hyperglycemia: Secondary | ICD-10-CM | POA: Diagnosis not present

## 2020-10-17 DIAGNOSIS — E860 Dehydration: Secondary | ICD-10-CM | POA: Diagnosis not present

## 2020-10-17 DIAGNOSIS — R110 Nausea: Secondary | ICD-10-CM | POA: Diagnosis not present

## 2020-10-17 DIAGNOSIS — R112 Nausea with vomiting, unspecified: Secondary | ICD-10-CM | POA: Diagnosis not present

## 2020-10-17 DIAGNOSIS — Z79899 Other long term (current) drug therapy: Secondary | ICD-10-CM | POA: Diagnosis not present

## 2020-10-17 DIAGNOSIS — D72829 Elevated white blood cell count, unspecified: Secondary | ICD-10-CM | POA: Diagnosis not present

## 2020-10-17 DIAGNOSIS — R109 Unspecified abdominal pain: Secondary | ICD-10-CM | POA: Diagnosis not present

## 2020-10-17 DIAGNOSIS — R1013 Epigastric pain: Secondary | ICD-10-CM | POA: Diagnosis not present

## 2020-10-17 DIAGNOSIS — R079 Chest pain, unspecified: Secondary | ICD-10-CM | POA: Diagnosis not present

## 2020-10-17 DIAGNOSIS — R1110 Vomiting, unspecified: Secondary | ICD-10-CM | POA: Diagnosis not present

## 2020-10-25 DIAGNOSIS — E1140 Type 2 diabetes mellitus with diabetic neuropathy, unspecified: Secondary | ICD-10-CM | POA: Diagnosis not present

## 2020-10-25 DIAGNOSIS — I959 Hypotension, unspecified: Secondary | ICD-10-CM | POA: Diagnosis not present

## 2020-10-25 DIAGNOSIS — R112 Nausea with vomiting, unspecified: Secondary | ICD-10-CM | POA: Diagnosis not present

## 2020-10-25 DIAGNOSIS — E860 Dehydration: Secondary | ICD-10-CM | POA: Diagnosis not present

## 2020-10-26 DIAGNOSIS — E119 Type 2 diabetes mellitus without complications: Secondary | ICD-10-CM | POA: Diagnosis not present

## 2020-11-04 DIAGNOSIS — E1344 Other specified diabetes mellitus with diabetic amyotrophy: Secondary | ICD-10-CM | POA: Diagnosis not present

## 2020-11-07 DIAGNOSIS — M170 Bilateral primary osteoarthritis of knee: Secondary | ICD-10-CM | POA: Diagnosis not present

## 2020-11-07 DIAGNOSIS — E1140 Type 2 diabetes mellitus with diabetic neuropathy, unspecified: Secondary | ICD-10-CM | POA: Diagnosis not present

## 2020-11-07 DIAGNOSIS — I10 Essential (primary) hypertension: Secondary | ICD-10-CM | POA: Diagnosis not present

## 2020-11-11 ENCOUNTER — Telehealth: Payer: Self-pay

## 2020-11-11 NOTE — Telephone Encounter (Signed)
Friday- 260, 42, 269, 165 Saturday- 46, 300, 321, did not check at bedtime (wasn't feeling too well) Sunday-307, 275, 317, 221 Monday- 286, 212,   Please Advise.

## 2020-11-11 NOTE — Telephone Encounter (Signed)
No answer. No VM

## 2020-11-11 NOTE — Telephone Encounter (Signed)
Advise to lower U-500 to 80 units  3 times a day with meals if glucose readings are above 90 and she is eating. She ahs to keep her appt. and bring her logs and device.

## 2020-11-12 NOTE — Telephone Encounter (Signed)
No answer. No VM

## 2020-11-13 NOTE — Telephone Encounter (Signed)
Pt.notified

## 2020-11-15 DIAGNOSIS — E11649 Type 2 diabetes mellitus with hypoglycemia without coma: Secondary | ICD-10-CM | POA: Diagnosis not present

## 2020-11-15 LAB — BASIC METABOLIC PANEL
BUN: 16 (ref 4–21)
CO2: 27 — AB (ref 13–22)
Chloride: 100 (ref 99–108)
Creatinine: 1.1 (ref 0.5–1.1)
Glucose: 247
Potassium: 5.4 — AB (ref 3.4–5.3)
Sodium: 135 — AB (ref 137–147)

## 2020-11-15 LAB — HEPATIC FUNCTION PANEL
ALT: 19 (ref 7–35)
AST: 13 (ref 13–35)
Alkaline Phosphatase: 50 (ref 25–125)
Bilirubin, Total: 0.5

## 2020-11-15 LAB — COMPREHENSIVE METABOLIC PANEL
Albumin: 3.7 (ref 3.5–5.0)
Calcium: 9 (ref 8.7–10.7)
GFR calc Af Amer: 64
GFR calc non Af Amer: 55

## 2020-11-16 DIAGNOSIS — Z794 Long term (current) use of insulin: Secondary | ICD-10-CM | POA: Diagnosis not present

## 2020-11-16 DIAGNOSIS — E11649 Type 2 diabetes mellitus with hypoglycemia without coma: Secondary | ICD-10-CM | POA: Diagnosis not present

## 2020-11-16 DIAGNOSIS — R9431 Abnormal electrocardiogram [ECG] [EKG]: Secondary | ICD-10-CM | POA: Diagnosis not present

## 2020-11-16 DIAGNOSIS — R4182 Altered mental status, unspecified: Secondary | ICD-10-CM | POA: Diagnosis not present

## 2020-11-16 DIAGNOSIS — Z20822 Contact with and (suspected) exposure to covid-19: Secondary | ICD-10-CM | POA: Diagnosis not present

## 2020-11-18 ENCOUNTER — Ambulatory Visit: Payer: Medicare Other | Admitting: Nurse Practitioner

## 2020-11-21 ENCOUNTER — Ambulatory Visit: Payer: Medicare Other | Admitting: Nurse Practitioner

## 2020-11-22 ENCOUNTER — Encounter: Payer: Self-pay | Admitting: Nurse Practitioner

## 2020-11-22 ENCOUNTER — Other Ambulatory Visit: Payer: Self-pay

## 2020-11-22 ENCOUNTER — Ambulatory Visit (INDEPENDENT_AMBULATORY_CARE_PROVIDER_SITE_OTHER): Payer: Medicare Other | Admitting: Nurse Practitioner

## 2020-11-22 VITALS — BP 111/74 | HR 84 | Ht 64.0 in | Wt 197.2 lb

## 2020-11-22 DIAGNOSIS — E1165 Type 2 diabetes mellitus with hyperglycemia: Secondary | ICD-10-CM | POA: Diagnosis not present

## 2020-11-22 DIAGNOSIS — E782 Mixed hyperlipidemia: Secondary | ICD-10-CM | POA: Diagnosis not present

## 2020-11-22 DIAGNOSIS — I10 Essential (primary) hypertension: Secondary | ICD-10-CM | POA: Diagnosis not present

## 2020-11-22 MED ORDER — HUMULIN R U-500 KWIKPEN 500 UNIT/ML ~~LOC~~ SOPN: 90.0000 [IU] | PEN_INJECTOR | Freq: Three times a day (TID) | SUBCUTANEOUS | 3 refills | Status: DC

## 2020-11-22 NOTE — Patient Instructions (Signed)

## 2020-11-22 NOTE — Progress Notes (Signed)
Endocrinology follow-up note       11/22/2020, 9:28 AM   Subjective:    Patient ID: Kari Miles, female    DOB: Apr 25, 1961.  Kari Miles is being seen in follow-up for management of chronically uncontrolled type 2 diabetes.  She also has hypertension, hyperlipidemia on treatment.    PMD:  Rosita Fire, MD.   Past Medical History:  Diagnosis Date  . Arthritis   . Carpal tunnel syndrome   . Chronic back pain   . Diabetes mellitus without complication ([place])   . Hypertension   . Tendonitis    Past Surgical History:  Procedure Laterality Date  . ABDOMINAL HYSTERECTOMY    . CESAREAN SECTION    . ORTHOPEDIC SURGERY     Social History   Socioeconomic History  . Marital status: Widowed    Spouse name: Not on file  . Number of children: Not on file  . Years of education: Not on file  . Highest education level: Not on file  Occupational History  . Not on file  Tobacco Use  . Smoking status: Current Every Day Smoker    Packs/day: 1.00    Years: 30.00    Pack years: 30.00    Types: Cigarettes  . Smokeless tobacco: Never Used  Vaping Use  . Vaping Use: Never used  Substance and Sexual Activity  . Alcohol use: No  . Drug use: No  . Sexual activity: Not on file  Other Topics Concern  . Not on file  Social History Narrative  . Not on file   Social Determinants of Health   Financial Resource Strain: Not on file  Food Insecurity: Not on file  Transportation Needs: Not on file  Physical Activity: Not on file  Stress: Not on file  Social Connections: Not on file    Family History  Problem Relation Age of Onset  . Seizures Mother   . Heart disease Father   . Cancer Sister   . Heart attack Sister   . Colon cancer Neg Hx    Outpatient Encounter Medications as of 11/22/2020  Medication Sig  . albuterol (PROVENTIL HFA;VENTOLIN HFA) 108 (90 Base) MCG/ACT inhaler Inhale 2 puffs into the lungs  every 6 (six) hours as needed for wheezing or shortness of breath.  . Alcohol Swabs 70 % PADS Use as directed to clean skin prior to injections tid and checking blood glucose tid  . amLODipine (NORVASC) 10 MG tablet Take 10 mg by mouth daily.  Marland Kitchen aspirin EC 81 MG tablet Take 81 mg by mouth daily.  Marland Kitchen atorvastatin (LIPITOR) 40 MG tablet Take 1 tablet (40 mg total) by mouth daily.  . Blood Glucose Monitoring Suppl (ACCU-CHEK GUIDE) w/Device KIT 1 Piece by Does not apply route as directed. Test BG 4 x daily. E11.65  . butalbital-acetaminophen-caffeine (FIORICET) 50-325-40 MG tablet TAKE 1 OR 2 TABLETS BY MOUTH EVERY 4 HOURS AS NEEDED  . Continuous Blood Gluc Sensor (FREESTYLE LIBRE 14 DAY SENSOR) MISC Inject 1 each into the skin every 14 (fourteen) days. Use as directed.  . diphenhydrAMINE (BENADRYL) 25 MG tablet Take 25 mg by mouth daily as needed for allergies.  Marland Kitchen gabapentin (NEURONTIN) 300 MG capsule Take  300 mg by mouth 3 (three) times daily.  Marland Kitchen glipiZIDE (GLUCOTROL XL) 10 MG 24 hr tablet Take 1 tablet (10 mg total) by mouth daily with breakfast.  . glucose blood test strip 1 each by Other route 4 (four) times daily. Use as instructed 4 x daily. E11.65 True Track  . hydrochlorothiazide (HYDRODIURIL) 25 MG tablet Take 25 mg by mouth daily.  Marland Kitchen ibuprofen (ADVIL) 800 MG tablet Take 800 mg by mouth 2 (two) times daily as needed.  . Ibuprofen-diphenhydrAMINE Cit 200-38 MG TABS Take 1 tablet by mouth at bedtime as needed (sleep).  . insulin regular human CONCENTRATED (HUMULIN R U-500 KWIKPEN) 500 UNIT/ML kwikpen Inject 90 Units into the skin 3 (three) times daily with meals.  Elmore Guise Devices (SIMPLE DIAGNOSTICS LANCING DEV) MISC Use 1 device As directed To test your blood sugar  . lisinopril (ZESTRIL) 40 MG tablet Take 40 mg by mouth daily.  . metFORMIN (GLUCOPHAGE) 500 MG tablet TAKE ONE TABLET BY MOUTH TWICE DAILY. TAKE WITH A MEAL. (Patient taking differently: Take 500 mg by mouth daily at 12 noon.)   . omeprazole (PRILOSEC) 20 MG capsule 1 PO 30 MINS PRIOR TO BREAKFAST. (Patient taking differently: Take 20 mg by mouth daily as needed (reflux).)  . Polyethylene Glycol 400 0.25 % SOLN Place 1 drop into both eyes every 6 (six) hours as needed (dry eyes).   Marland Kitchen spironolactone (ALDACTONE) 25 MG tablet Take 25 mg by mouth 2 (two) times daily.  . [DISCONTINUED] insulin regular human CONCENTRATED (HUMULIN R U-500 KWIKPEN) 500 UNIT/ML kwikpen Inject 110 Units into the skin 3 (three) times daily with meals.   No facility-administered encounter medications on file as of 11/22/2020.    ALLERGIES: No Known Allergies  VACCINATION STATUS: Immunization History  Administered Date(s) Administered  . Influenza-Unspecified 08/28/2018  . Moderna Sars-Covid-2 Vaccination 09/21/2019, 10/19/2019    Diabetes She presents for her follow-up diabetic visit. She has type 2 diabetes mellitus. Onset time: she was diagnosed at approximate age of 63 years. Her disease course has been fluctuating. Hypoglycemia symptoms include nervousness/anxiousness, sweats and tremors. Pertinent negatives for hypoglycemia include no headaches, pallor or seizures. Associated symptoms include foot paresthesias, polydipsia and polyuria. Pertinent negatives for diabetes include no chest pain and no polyphagia. Hypoglycemia complications include nocturnal hypoglycemia. Symptoms are stable. Diabetic complications include nephropathy and peripheral neuropathy. Risk factors for coronary artery disease include diabetes mellitus, dyslipidemia, family history, hypertension, obesity, sedentary lifestyle, tobacco exposure and post-menopausal. Current diabetic treatment includes intensive insulin program and oral agent (dual therapy). She is compliant with treatment most of the time. Her weight is increasing steadily. She is following a generally unhealthy (still consumes sodas (has cut back)) diet. When asked about meal planning, she reported none. She  has not had a previous visit with a dietitian. She never participates in exercise. Her home blood glucose trend is fluctuating dramatically. Her breakfast blood glucose range is generally >200 mg/dl. Her lunch blood glucose range is generally >200 mg/dl. Her dinner blood glucose range is generally >200 mg/dl. Her bedtime blood glucose range is generally 130-140 mg/dl. Her overall blood glucose range is >200 mg/dl. (She presents today with her CGM and logs showing widely fluctuating glycemic profile.  Her most recent A1c was 10.6% on 4/22.  She was recently hospitalized for hypoglycemia secondary to decreased appetite.  Her U500 was lowered to 80 units TID with meals due to this.  She has now regained her appetite and her glucose has rebounded.  Analysis of  her CGM shows TIR 32%, TAR 62%, TBR 6%.  She tends to have some drops in glucose in the overnight/early morning hours (likely related to her meal pattern). ) An ACE inhibitor/angiotensin II receptor blocker is being taken. She does not see a podiatrist.Eye exam is not current.  Hypertension This is a chronic problem. The current episode started more than 1 year ago. The problem has been waxing and waning since onset. The problem is uncontrolled. Associated symptoms include sweats. Pertinent negatives include no chest pain, headaches, palpitations or shortness of breath. Risk factors for coronary artery disease include diabetes mellitus, obesity, sedentary lifestyle, smoking/tobacco exposure, family history, dyslipidemia and stress. Past treatments include ACE inhibitors, calcium channel blockers and diuretics. The current treatment provides moderate improvement. Compliance problems include psychosocial issues and diet.     Review of systems  Constitutional: + steadily increasing body weight,  current Body mass index is 33.85 kg/m. , + fatigue, no subjective hyperthermia, no subjective hypothermia Eyes: no blurry vision, no xerophthalmia ENT: no sore  throat, no nodules palpated in throat, no dysphagia/odynophagia, no hoarseness Cardiovascular: no chest pain, no shortness of breath, no palpitations, no leg swelling Respiratory: no cough, no shortness of breath Gastrointestinal: no nausea/vomiting/diarrhea Musculoskeletal: no muscle/joint aches, complaining of left leg pain (has history of lower back problems) Skin: no rashes, no hyperemia Neurological: no tremors, no dizziness, + numbness/tingling/cold sensation in bilateral feet. Psychiatric: no depression, no anxiety   Objective:    BP 111/74   Pulse 84   Ht _0  (1.626 m)   Wt 197 lb 3.2 oz (89.4 kg)   BMI 33.85 kg/m   Wt Readings from Last 3 Encounters:  11/22/20 197 lb 3.2 oz (89.4 kg)  08/20/20 186 lb 3.2 oz (84.5 kg)  05/20/20 177 lb 9.6 oz (80.6 kg)    BP Readings from Last 3 Encounters:  11/22/20 111/74  08/20/20 (!) 144/87  05/20/20 (!) 142/90     Physical Exam- Limited  Constitutional:  Body mass index is 33.85 kg/m. , not in acute distress, normal state of mind Eyes:  EOMI, no exophthalmos Neck: Supple Cardiovascular: RRR, no murmurs, rubs, or gallops, no edema Respiratory: Adequate breathing efforts, no crackles, rales, rhonchi, or wheezing Musculoskeletal: no gross deformities, strength intact in all four extremities, no gross restriction of joint movements Skin:  no rashes, no hyperemia Neurological: no tremor with outstretched hands    CMP ( most recent) CMP     Component Value Date/Time   NA 136 09/22/2017 1130   K 3.9 09/22/2017 1130   CL 99 09/22/2017 1130   CO2 32 09/22/2017 1130   GLUCOSE 225 (H) 09/22/2017 1130   BUN 8 12/20/2019 0000   CREATININE 0.6 12/20/2019 0000   CREATININE 0.56 09/22/2017 1130   CALCIUM 10.1 05/14/2020 0000   PROT 7.1 09/22/2017 1130   ALBUMIN 4.6 04/28/2013 1236   AST 27 09/22/2017 1130   ALT 48 (H) 09/22/2017 1130   ALKPHOS 77 04/28/2013 1236   BILITOT 0.6 09/22/2017 1130   GFRNONAA >90 12/20/2019 0000    GFRNONAA 104 09/22/2017 1130   GFRAA >90 05/14/2020 0000   GFRAA 120 09/22/2017 1130    Diabetic Labs (most recent): Lab Results  Component Value Date   HGBA1C 10.7 (A) 08/20/2020   HGBA1C 10.9 (A) 04/30/2020   HGBA1C 10.4 (A) 01/26/2020     Lipid Panel     Component Value Date/Time   CHOL 94 12/01/2018 0000   TRIG 68 05/14/2020 0000  HDL 42 05/14/2020 0000   LDLCALC 114 05/14/2020 0000      Assessment & Plan:   1) Uncontrolled type 2 diabetes mellitus with hyperglycemia ([place])  - Kari Miles has currently uncontrolled symptomatic type 2 DM since  60 years of age.  She presents today with her CGM and logs showing widely fluctuating glycemic profile.  Her most recent A1c was 10.6% on 4/22.  She was recently hospitalized for hypoglycemia secondary to decreased appetite.  Her U500 was lowered to 80 units TID with meals due to this.  She has now regained her appetite and her glucose has rebounded.  Analysis of her CGM shows TIR 32%, TAR 62%, TBR 6%.  She tends to have some drops in glucose in the overnight/early morning hours (likely related to her meal pattern).  her diabetes is complicated by noncompliance/nonadherence, chronic heavy smoking and Kari Miles remains at a high risk for more acute and chronic complications which include CAD, CVA, CKD, retinopathy, and neuropathy. These are all discussed in detail with the patient.  - Nutritional counseling repeated at each appointment due to patients tendency to fall back in to old habits.  - The patient admits there is a room for improvement in their diet and drink choices. -  Suggestion is made for the patient to avoid simple carbohydrates from their diet including Cakes, Sweet Desserts / Pastries, Ice Cream, Soda (diet and regular), Sweet Tea, Candies, Chips, Cookies, Sweet Pastries, Store Bought Juices, Alcohol in Excess of 1-2 drinks a day, Artificial Sweeteners, Coffee Creamer, and "Sugar-free" Products. This will help  patient to have stable blood glucose profile and potentially avoid unintended weight gain.   - I encouraged the patient to switch to unprocessed or minimally processed complex starch and increased protein intake (animal or plant source), fruits, and vegetables.   - Patient is advised to stick to a routine mealtimes to eat 3 meals a day and avoid unnecessary snacks (to snack only to correct hypoglycemia).  - I have approached her with the following individualized plan to manage diabetes and patient agrees:   -Given her current and persisting glycemic burden, she will continue to require intensive treatment with multiple daily injections of insulin and oral therapies in order for her to achieve control of diabetes to target.  -She has now the freestyle libre CGM device.  She is advised to continue scanning at least 4 times a day before meals and at bedtime, and to call the clinic if she has readings less than 70 or greater than 300 for 3 tests in a row.  In times where she cannot obtain CGM sensor, she is advised to use backup meter and logs for documenting blood sugars.  -Now that her appetite has returned, her glucose is rebounding.  She is advised to increase her U-500 to 90 units TID with meals if glucose readings are above 90 and she is eating. She is advised to continue Metformin 500 mg po twice daily with meals and continue Glipizide to 10 mg XL Po daily with breakfast.  - Patient is warned not to take insulin without proper monitoring per orders. -Adjustment parameters are given for hypo and hyperglycemia in writing.  -- she not a suitable candidate for incretin therapy given her chronic heavy smoking.   - Patient specific target  A1c;  LDL, HDL, Triglycerides, and  Waist Circumference were discussed in detail.  2) BP/HTN:  Her blood pressure is controlled to target.  She is advised to continue  Norvasc 10 mg po daily, HCTZ 25 mg po daily, and Lisinopril 40 mg po daily.    3) Lipids/HPL:   Her most recent lipid panel from 05/14/20 shows uncontrolled LDL of 114 (worsening since last visit).  She is advised to increase her dose of Lipitor to 40 mg po daily at bedtime.  Side effects and precautions discussed with her.  4)  Weight/Diet: Her Body mass index is 33.85 kg/m.- clearly complicating her diabetes care.  She is a candidate for modest weight loss.  CDE Consult will be initiated , exercise, and detailed carbohydrates information provided.  She has missed her appointments with Kari Miles, Kari Miles due to transportation issues.  I reordered nutrition consult at last visit.  5) Chronic Care/Health Maintenance: -she  is on ACEI medications and statin is encouraged to continue to follow up with Ophthalmology, Dentist,  Podiatrist at least yearly or according to recommendations, and advised to quit smoking. I have recommended yearly flu vaccine and pneumonia vaccination at least every 5 years; moderate intensity exercise for up to 150 minutes weekly; and  sleep for at least 7 hours a day.  -The patient was counseled on the dangers of tobacco use, and was advised to quit.  Reviewed strategies to maximize success, including removing cigarettes and smoking materials from environment.  - I advised patient to maintain close follow up with Rosita Fire, MD for primary care needs.    I spent 25 minutes in the care of the patient today including review of labs from [place], Lipids, Thyroid Function, Hematology (current and previous including abstractions from other facilities); face-to-face time discussing  her blood glucose readings/logs, discussing hypoglycemia and hyperglycemia episodes and symptoms, medications doses, her options of short and long term treatment based on the latest standards of care / guidelines;  discussion about incorporating lifestyle medicine;  and documenting the encounter.    Please refer to Patient Instructions for Blood Glucose Monitoring and Insulin/Medications Dosing  Guide"  in media tab for additional information. Please  also refer to " Patient Self Inventory" in the Media  tab for reviewed elements of pertinent patient history.  Kari Miles participated in the discussions, expressed understanding, and voiced agreement with the above plans.  All questions were answered to her satisfaction. she is encouraged to contact clinic should she have any questions or concerns prior to her return visit.    Follow up plan: Return in about 3 months (around 02/22/2021) for Diabetes F/U with A1c in office, Previsit labs, Bring meter and logs, ABI next visit.   Rayetta Pigg, [facility] [address] [place], [place] [address] Phone: [phone] Fax: [phone]  11/22/2020, 9:28 AM

## 2020-11-26 DIAGNOSIS — E119 Type 2 diabetes mellitus without complications: Secondary | ICD-10-CM | POA: Diagnosis not present

## 2020-11-28 ENCOUNTER — Telehealth: Payer: Self-pay | Admitting: Nurse Practitioner

## 2020-11-28 NOTE — Telephone Encounter (Signed)
Called Pt. No answer. No VM.

## 2020-11-28 NOTE — Telephone Encounter (Signed)
Pt is calling and states that they are now charging her for her freestyle libre. She states she was getting it through the mail service and now she is getting it at [facility]. Patient states that [facility] is now delivering it in her mailbox... but then she is unsure if it [facility] delivering it. Pt is requesting to speak with Alphonzo Lemmings about this as she states that is who was working on it. Pt states she has never had to pay for the freestyle libre in the past

## 2020-11-28 NOTE — Telephone Encounter (Signed)
I do not know the reason she is being billed for the [place] now.  I believe she needs to call the pharmacy and find out the reason or either call the insurance company and ask them for details.

## 2020-11-29 NOTE — Telephone Encounter (Signed)
No answer. No VM

## 2020-12-03 DIAGNOSIS — E1140 Type 2 diabetes mellitus with diabetic neuropathy, unspecified: Secondary | ICD-10-CM | POA: Diagnosis not present

## 2020-12-03 DIAGNOSIS — I10 Essential (primary) hypertension: Secondary | ICD-10-CM | POA: Diagnosis not present

## 2020-12-03 DIAGNOSIS — M25512 Pain in left shoulder: Secondary | ICD-10-CM | POA: Diagnosis not present

## 2020-12-26 DIAGNOSIS — E119 Type 2 diabetes mellitus without complications: Secondary | ICD-10-CM | POA: Diagnosis not present

## 2020-12-31 ENCOUNTER — Telehealth: Payer: Self-pay

## 2020-12-31 NOTE — Telephone Encounter (Signed)
error 

## 2021-01-02 DIAGNOSIS — E1140 Type 2 diabetes mellitus with diabetic neuropathy, unspecified: Secondary | ICD-10-CM | POA: Diagnosis not present

## 2021-01-02 DIAGNOSIS — I10 Essential (primary) hypertension: Secondary | ICD-10-CM | POA: Diagnosis not present

## 2021-01-08 ENCOUNTER — Other Ambulatory Visit: Payer: Self-pay | Admitting: Nurse Practitioner

## 2021-01-09 ENCOUNTER — Telehealth: Payer: Self-pay | Admitting: Nurse Practitioner

## 2021-01-09 MED ORDER — BLOOD GLUCOSE MONITOR KIT: PACK | 0 refills | Status: AC

## 2021-01-09 NOTE — Telephone Encounter (Signed)
Pt is calling and states she has nothing to test her sugar with. She is needing a meter and testing supplies. [facility] - [place], [place] - [phone] Phone:  [phone]  Fax:  [phone]

## 2021-01-26 DIAGNOSIS — Z7984 Long term (current) use of oral hypoglycemic drugs: Secondary | ICD-10-CM | POA: Diagnosis not present

## 2021-01-26 DIAGNOSIS — E1169 Type 2 diabetes mellitus with other specified complication: Secondary | ICD-10-CM | POA: Diagnosis present

## 2021-01-26 DIAGNOSIS — M1990 Unspecified osteoarthritis, unspecified site: Secondary | ICD-10-CM | POA: Diagnosis present

## 2021-01-26 DIAGNOSIS — N179 Acute kidney failure, unspecified: Secondary | ICD-10-CM | POA: Diagnosis not present

## 2021-01-26 DIAGNOSIS — K5900 Constipation, unspecified: Secondary | ICD-10-CM | POA: Diagnosis not present

## 2021-01-26 DIAGNOSIS — E119 Type 2 diabetes mellitus without complications: Secondary | ICD-10-CM | POA: Diagnosis not present

## 2021-01-26 DIAGNOSIS — R1013 Epigastric pain: Secondary | ICD-10-CM | POA: Diagnosis not present

## 2021-01-26 DIAGNOSIS — D72829 Elevated white blood cell count, unspecified: Secondary | ICD-10-CM | POA: Diagnosis not present

## 2021-01-26 DIAGNOSIS — J449 Chronic obstructive pulmonary disease, unspecified: Secondary | ICD-10-CM | POA: Diagnosis not present

## 2021-01-26 DIAGNOSIS — E785 Hyperlipidemia, unspecified: Secondary | ICD-10-CM | POA: Diagnosis not present

## 2021-01-26 DIAGNOSIS — R1084 Generalized abdominal pain: Secondary | ICD-10-CM | POA: Diagnosis not present

## 2021-01-26 DIAGNOSIS — E1310 Other specified diabetes mellitus with ketoacidosis without coma: Secondary | ICD-10-CM | POA: Diagnosis not present

## 2021-01-26 DIAGNOSIS — I700 Atherosclerosis of aorta: Secondary | ICD-10-CM | POA: Diagnosis not present

## 2021-01-26 DIAGNOSIS — R109 Unspecified abdominal pain: Secondary | ICD-10-CM | POA: Diagnosis not present

## 2021-01-26 DIAGNOSIS — I10 Essential (primary) hypertension: Secondary | ICD-10-CM | POA: Diagnosis present

## 2021-01-26 DIAGNOSIS — Z20822 Contact with and (suspected) exposure to covid-19: Secondary | ICD-10-CM | POA: Diagnosis not present

## 2021-01-26 DIAGNOSIS — K3184 Gastroparesis: Secondary | ICD-10-CM | POA: Diagnosis present

## 2021-01-26 DIAGNOSIS — E1165 Type 2 diabetes mellitus with hyperglycemia: Secondary | ICD-10-CM | POA: Diagnosis not present

## 2021-01-26 DIAGNOSIS — R112 Nausea with vomiting, unspecified: Secondary | ICD-10-CM | POA: Diagnosis not present

## 2021-01-26 DIAGNOSIS — Z609 Problem related to social environment, unspecified: Secondary | ICD-10-CM | POA: Diagnosis not present

## 2021-01-26 DIAGNOSIS — J45909 Unspecified asthma, uncomplicated: Secondary | ICD-10-CM | POA: Diagnosis present

## 2021-01-26 DIAGNOSIS — F17210 Nicotine dependence, cigarettes, uncomplicated: Secondary | ICD-10-CM | POA: Diagnosis not present

## 2021-01-26 DIAGNOSIS — E1110 Type 2 diabetes mellitus with ketoacidosis without coma: Secondary | ICD-10-CM | POA: Diagnosis not present

## 2021-01-26 DIAGNOSIS — Z794 Long term (current) use of insulin: Secondary | ICD-10-CM | POA: Diagnosis not present

## 2021-01-26 DIAGNOSIS — Z79899 Other long term (current) drug therapy: Secondary | ICD-10-CM | POA: Diagnosis not present

## 2021-01-26 DIAGNOSIS — R110 Nausea: Secondary | ICD-10-CM | POA: Diagnosis not present

## 2021-01-26 DIAGNOSIS — Z5941 Food insecurity: Secondary | ICD-10-CM | POA: Diagnosis not present

## 2021-01-26 DIAGNOSIS — E1143 Type 2 diabetes mellitus with diabetic autonomic (poly)neuropathy: Secondary | ICD-10-CM | POA: Diagnosis present

## 2021-01-29 ENCOUNTER — Ambulatory Visit: Payer: Medicare Other | Admitting: "Endocrinology

## 2021-02-05 ENCOUNTER — Other Ambulatory Visit (HOSPITAL_COMMUNITY)
Admission: RE | Admit: 2021-02-05 | Discharge: 2021-02-05 | Disposition: A | Discharge status: Home / Self Care | Payer: Medicare Other | Source: Ambulatory Visit | Attending: Internal Medicine | Admitting: Internal Medicine

## 2021-02-05 DIAGNOSIS — I10 Essential (primary) hypertension: Secondary | ICD-10-CM | POA: Insufficient documentation

## 2021-02-05 DIAGNOSIS — N3946 Mixed incontinence: Secondary | ICD-10-CM | POA: Diagnosis not present

## 2021-02-05 DIAGNOSIS — E1140 Type 2 diabetes mellitus with diabetic neuropathy, unspecified: Secondary | ICD-10-CM | POA: Insufficient documentation

## 2021-02-05 DIAGNOSIS — E1101 Type 2 diabetes mellitus with hyperosmolarity with coma: Secondary | ICD-10-CM | POA: Diagnosis not present

## 2021-02-05 LAB — CBC WITH DIFFERENTIAL/PLATELET
Abs Immature Granulocytes: 0.06 10*3/uL (ref 0.00–0.07)
Basophils Absolute: 0.1 10*3/uL (ref 0.0–0.1)
Basophils Relative: 1 %
Eosinophils Absolute: 0.1 10*3/uL (ref 0.0–0.5)
Eosinophils Relative: 1 %
HCT: 43.9 % (ref 36.0–46.0)
Hemoglobin: 15.1 g/dL — ABNORMAL HIGH (ref 12.0–15.0)
Immature Granulocytes: 1 %
Lymphocytes Relative: 32 %
Lymphs Abs: 4.2 10*3/uL — ABNORMAL HIGH (ref 0.7–4.0)
MCH: 30 pg (ref 26.0–34.0)
MCHC: 34.4 g/dL (ref 30.0–36.0)
MCV: 87.1 fL (ref 80.0–100.0)
Monocytes Absolute: 0.9 10*3/uL (ref 0.1–1.0)
Monocytes Relative: 7 %
Neutro Abs: 7.9 10*3/uL — ABNORMAL HIGH (ref 1.7–7.7)
Neutrophils Relative %: 58 %
Platelets: 348 10*3/uL (ref 150–400)
RBC: 5.04 MIL/uL (ref 3.87–5.11)
RDW: 12.4 % (ref 11.5–15.5)
WBC: 13.2 10*3/uL — ABNORMAL HIGH (ref 4.0–10.5)
nRBC: 0 % (ref 0.0–0.2)

## 2021-02-05 LAB — BASIC METABOLIC PANEL
Anion gap: 12 (ref 5–15)
BUN: 14 mg/dL (ref 8–23)
CO2: 29 mmol/L (ref 22–32)
Calcium: 9.6 mg/dL (ref 8.9–10.3)
Chloride: 88 mmol/L — ABNORMAL LOW (ref 98–111)
Creatinine, Ser: 1.02 mg/dL — ABNORMAL HIGH (ref 0.44–1.00)
GFR, Estimated: >60 mL/min (ref >60)
Glucose, Bld: 134 mg/dL — ABNORMAL HIGH (ref 70–99)
Potassium: 3.8 mmol/L (ref 3.5–5.1)
Sodium: 129 mmol/L — ABNORMAL LOW (ref 135–145)

## 2021-02-05 LAB — HEMOGLOBIN A1C
Hgb A1c MFr Bld: 9.1 % — ABNORMAL HIGH (ref 4.8–5.6)
Mean Plasma Glucose: 214.47 mg/dL

## 2021-02-19 DIAGNOSIS — E1165 Type 2 diabetes mellitus with hyperglycemia: Secondary | ICD-10-CM | POA: Diagnosis not present

## 2021-02-26 ENCOUNTER — Ambulatory Visit: Payer: Medicare Other | Admitting: Nurse Practitioner

## 2021-02-26 DIAGNOSIS — E119 Type 2 diabetes mellitus without complications: Secondary | ICD-10-CM | POA: Diagnosis not present

## 2021-02-28 DIAGNOSIS — Z90710 Acquired absence of both cervix and uterus: Secondary | ICD-10-CM | POA: Diagnosis not present

## 2021-02-28 DIAGNOSIS — R112 Nausea with vomiting, unspecified: Secondary | ICD-10-CM | POA: Diagnosis not present

## 2021-02-28 DIAGNOSIS — Z7951 Long term (current) use of inhaled steroids: Secondary | ICD-10-CM | POA: Diagnosis not present

## 2021-02-28 DIAGNOSIS — F17210 Nicotine dependence, cigarettes, uncomplicated: Secondary | ICD-10-CM | POA: Diagnosis not present

## 2021-02-28 DIAGNOSIS — Z90721 Acquired absence of ovaries, unilateral: Secondary | ICD-10-CM | POA: Diagnosis not present

## 2021-02-28 DIAGNOSIS — Z7984 Long term (current) use of oral hypoglycemic drugs: Secondary | ICD-10-CM | POA: Diagnosis not present

## 2021-02-28 DIAGNOSIS — I10 Essential (primary) hypertension: Secondary | ICD-10-CM | POA: Diagnosis not present

## 2021-02-28 DIAGNOSIS — K2970 Gastritis, unspecified, without bleeding: Secondary | ICD-10-CM | POA: Diagnosis not present

## 2021-02-28 DIAGNOSIS — E785 Hyperlipidemia, unspecified: Secondary | ICD-10-CM | POA: Diagnosis not present

## 2021-02-28 DIAGNOSIS — E1165 Type 2 diabetes mellitus with hyperglycemia: Secondary | ICD-10-CM | POA: Diagnosis not present

## 2021-02-28 DIAGNOSIS — Z79899 Other long term (current) drug therapy: Secondary | ICD-10-CM | POA: Diagnosis not present

## 2021-02-28 DIAGNOSIS — Z20822 Contact with and (suspected) exposure to covid-19: Secondary | ICD-10-CM | POA: Diagnosis not present

## 2021-03-05 DIAGNOSIS — J45909 Unspecified asthma, uncomplicated: Secondary | ICD-10-CM | POA: Diagnosis present

## 2021-03-05 DIAGNOSIS — E11649 Type 2 diabetes mellitus with hypoglycemia without coma: Secondary | ICD-10-CM | POA: Diagnosis present

## 2021-03-05 DIAGNOSIS — R4182 Altered mental status, unspecified: Secondary | ICD-10-CM | POA: Diagnosis not present

## 2021-03-05 DIAGNOSIS — I10 Essential (primary) hypertension: Secondary | ICD-10-CM | POA: Diagnosis not present

## 2021-03-05 DIAGNOSIS — N179 Acute kidney failure, unspecified: Secondary | ICD-10-CM | POA: Diagnosis present

## 2021-03-05 DIAGNOSIS — E785 Hyperlipidemia, unspecified: Secondary | ICD-10-CM | POA: Diagnosis present

## 2021-03-05 DIAGNOSIS — Z79899 Other long term (current) drug therapy: Secondary | ICD-10-CM | POA: Diagnosis not present

## 2021-03-05 DIAGNOSIS — R630 Anorexia: Secondary | ICD-10-CM | POA: Diagnosis not present

## 2021-03-05 DIAGNOSIS — R404 Transient alteration of awareness: Secondary | ICD-10-CM | POA: Diagnosis not present

## 2021-03-05 DIAGNOSIS — E1165 Type 2 diabetes mellitus with hyperglycemia: Secondary | ICD-10-CM | POA: Diagnosis not present

## 2021-03-05 DIAGNOSIS — R569 Unspecified convulsions: Secondary | ICD-10-CM | POA: Diagnosis not present

## 2021-03-05 DIAGNOSIS — D72829 Elevated white blood cell count, unspecified: Secondary | ICD-10-CM | POA: Diagnosis not present

## 2021-03-05 DIAGNOSIS — Z7984 Long term (current) use of oral hypoglycemic drugs: Secondary | ICD-10-CM | POA: Diagnosis not present

## 2021-03-05 DIAGNOSIS — I6782 Cerebral ischemia: Secondary | ICD-10-CM | POA: Diagnosis not present

## 2021-03-05 DIAGNOSIS — E860 Dehydration: Secondary | ICD-10-CM | POA: Diagnosis present

## 2021-03-05 DIAGNOSIS — Z0389 Encounter for observation for other suspected diseases and conditions ruled out: Secondary | ICD-10-CM | POA: Diagnosis not present

## 2021-03-05 DIAGNOSIS — Z20822 Contact with and (suspected) exposure to covid-19: Secondary | ICD-10-CM | POA: Diagnosis present

## 2021-03-05 DIAGNOSIS — Z794 Long term (current) use of insulin: Secondary | ICD-10-CM | POA: Diagnosis not present

## 2021-03-05 DIAGNOSIS — F17210 Nicotine dependence, cigarettes, uncomplicated: Secondary | ICD-10-CM | POA: Diagnosis present

## 2021-03-05 DIAGNOSIS — E119 Type 2 diabetes mellitus without complications: Secondary | ICD-10-CM | POA: Diagnosis not present

## 2021-03-05 DIAGNOSIS — R739 Hyperglycemia, unspecified: Secondary | ICD-10-CM | POA: Diagnosis not present

## 2021-03-05 DIAGNOSIS — E161 Other hypoglycemia: Secondary | ICD-10-CM | POA: Diagnosis not present

## 2021-03-05 DIAGNOSIS — E162 Hypoglycemia, unspecified: Secondary | ICD-10-CM | POA: Diagnosis not present

## 2021-03-05 DIAGNOSIS — Z5941 Food insecurity: Secondary | ICD-10-CM | POA: Diagnosis not present

## 2021-03-05 DIAGNOSIS — R410 Disorientation, unspecified: Secondary | ICD-10-CM | POA: Diagnosis not present

## 2021-03-10 ENCOUNTER — Telehealth: Payer: Self-pay

## 2021-03-10 DIAGNOSIS — Z7984 Long term (current) use of oral hypoglycemic drugs: Secondary | ICD-10-CM | POA: Diagnosis not present

## 2021-03-10 DIAGNOSIS — I10 Essential (primary) hypertension: Secondary | ICD-10-CM | POA: Diagnosis not present

## 2021-03-10 DIAGNOSIS — Z90710 Acquired absence of both cervix and uterus: Secondary | ICD-10-CM | POA: Diagnosis not present

## 2021-03-10 DIAGNOSIS — M1990 Unspecified osteoarthritis, unspecified site: Secondary | ICD-10-CM | POA: Diagnosis not present

## 2021-03-10 DIAGNOSIS — J45909 Unspecified asthma, uncomplicated: Secondary | ICD-10-CM | POA: Diagnosis not present

## 2021-03-10 DIAGNOSIS — N179 Acute kidney failure, unspecified: Secondary | ICD-10-CM | POA: Diagnosis not present

## 2021-03-10 DIAGNOSIS — Z794 Long term (current) use of insulin: Secondary | ICD-10-CM | POA: Diagnosis not present

## 2021-03-10 DIAGNOSIS — E11649 Type 2 diabetes mellitus with hypoglycemia without coma: Secondary | ICD-10-CM | POA: Diagnosis not present

## 2021-03-10 DIAGNOSIS — Z87891 Personal history of nicotine dependence: Secondary | ICD-10-CM | POA: Diagnosis not present

## 2021-03-10 DIAGNOSIS — Z90722 Acquired absence of ovaries, bilateral: Secondary | ICD-10-CM | POA: Diagnosis not present

## 2021-03-10 NOTE — Telephone Encounter (Signed)
Davene with [facility] is needing some clarification on her medication. Please call back at [phone]

## 2021-03-11 NOTE — Telephone Encounter (Signed)
Called Davene and she was not able to talk at this moment due to working with a patient and will try to call us back later today.

## 2021-03-14 ENCOUNTER — Ambulatory Visit: Payer: Medicare Other | Admitting: Nurse Practitioner

## 2021-03-14 DIAGNOSIS — M1990 Unspecified osteoarthritis, unspecified site: Secondary | ICD-10-CM | POA: Diagnosis not present

## 2021-03-14 DIAGNOSIS — N179 Acute kidney failure, unspecified: Secondary | ICD-10-CM | POA: Diagnosis not present

## 2021-03-14 DIAGNOSIS — Z87891 Personal history of nicotine dependence: Secondary | ICD-10-CM | POA: Diagnosis not present

## 2021-03-14 DIAGNOSIS — E11649 Type 2 diabetes mellitus with hypoglycemia without coma: Secondary | ICD-10-CM | POA: Diagnosis not present

## 2021-03-14 DIAGNOSIS — I10 Essential (primary) hypertension: Secondary | ICD-10-CM | POA: Diagnosis not present

## 2021-03-14 DIAGNOSIS — J45909 Unspecified asthma, uncomplicated: Secondary | ICD-10-CM | POA: Diagnosis not present

## 2021-03-18 DIAGNOSIS — N179 Acute kidney failure, unspecified: Secondary | ICD-10-CM | POA: Diagnosis not present

## 2021-03-18 DIAGNOSIS — Z87891 Personal history of nicotine dependence: Secondary | ICD-10-CM | POA: Diagnosis not present

## 2021-03-18 DIAGNOSIS — I10 Essential (primary) hypertension: Secondary | ICD-10-CM | POA: Diagnosis not present

## 2021-03-18 DIAGNOSIS — E11649 Type 2 diabetes mellitus with hypoglycemia without coma: Secondary | ICD-10-CM | POA: Diagnosis not present

## 2021-03-18 DIAGNOSIS — J45909 Unspecified asthma, uncomplicated: Secondary | ICD-10-CM | POA: Diagnosis not present

## 2021-03-18 DIAGNOSIS — M1990 Unspecified osteoarthritis, unspecified site: Secondary | ICD-10-CM | POA: Diagnosis not present

## 2021-03-18 NOTE — Patient Instructions (Signed)
Diabetes Mellitus and Nutrition, Adult When you have diabetes, or diabetes mellitus, it is very important to have healthy eating habits because your blood sugar (glucose) levels are greatly affected by what you eat and drink. Eating healthy foods in the right amounts, at about the same times every day, can help you:  Control your blood glucose.  Lower your risk of heart disease.  Improve your blood pressure.  Reach or maintain a healthy weight. What can affect my meal plan? Every person with diabetes is different, and each person has different needs for a meal plan. Your health care provider may recommend that you work with a dietitian to make a meal plan that is best for you. Your meal plan may vary depending on factors such as:  The calories you need.  The medicines you take.  Your weight.  Your blood glucose, blood pressure, and cholesterol levels.  Your activity level.  Other health conditions you have, such as heart or kidney disease. How do carbohydrates affect me? Carbohydrates, also called carbs, affect your blood glucose level more than any other type of food. Eating carbs naturally raises the amount of glucose in your blood. Carb counting is a method for keeping track of how many carbs you eat. Counting carbs is important to keep your blood glucose at a healthy level, especially if you use insulin or take certain oral diabetes medicines. It is important to know how many carbs you can safely have in each meal. This is different for every person. Your dietitian can help you calculate how many carbs you should have at each meal and for each snack. How does alcohol affect me? Alcohol can cause a sudden decrease in blood glucose (hypoglycemia), especially if you use insulin or take certain oral diabetes medicines. Hypoglycemia can be a life-threatening condition. Symptoms of hypoglycemia, such as sleepiness, dizziness, and confusion, are similar to symptoms of having too much  alcohol.  Do not drink alcohol if: ? Your health care provider tells you not to drink. ? You are pregnant, may be pregnant, or are planning to become pregnant.  If you drink alcohol: ? Do not drink on an empty stomach. ? Limit how much you use to:  0-1 drink a day for women.  0-2 drinks a day for men. ? Be aware of how much alcohol is in your drink. In the [place] one drink equals one 12 oz bottle of beer (355 mL), one 5 oz glass of wine (148 mL), or one 1 oz glass of hard liquor (44 mL). ? Keep yourself hydrated with water, diet soda, or unsweetened iced tea.  Keep in mind that regular soda, juice, and other mixers may contain a lot of sugar and must be counted as carbs. What are tips for following this plan? Reading food labels  Start by checking the serving size on the "Nutrition Facts" label of packaged foods and drinks. The amount of calories, carbs, fats, and other nutrients listed on the label is based on one serving of the item. Many items contain more than one serving per package.  Check the total grams (g) of carbs in one serving. You can calculate the number of servings of carbs in one serving by dividing the total carbs by 15. For example, if a food has 30 g of total carbs per serving, it would be equal to 2 servings of carbs.  Check the number of grams (g) of saturated fats and trans fats in one serving. Choose foods that have   a low amount or none of these fats.  Check the number of milligrams (mg) of salt (sodium) in one serving. Most people should limit total sodium intake to less than 2,300 mg per day.  Always check the nutrition information of foods labeled as "low-fat" or "nonfat." These foods may be higher in added sugar or refined carbs and should be avoided.  Talk to your dietitian to identify your daily goals for nutrients listed on the label. Shopping  Avoid buying canned, pre-made, or processed foods. These foods tend to be high in fat, sodium, and added  sugar.  Shop around the outside edge of the grocery store. This is where you will most often find fresh fruits and vegetables, bulk grains, fresh meats, and fresh dairy. Cooking  Use low-heat cooking methods, such as baking, instead of high-heat cooking methods like deep frying.  Cook using healthy oils, such as olive, canola, or sunflower oil.  Avoid cooking with butter, cream, or high-fat meats. Meal planning  Eat meals and snacks regularly, preferably at the same times every day. Avoid going long periods of time without eating.  Eat foods that are high in fiber, such as fresh fruits, vegetables, beans, and whole grains. Talk with your dietitian about how many servings of carbs you can eat at each meal.  Eat 4-6 oz (112-168 g) of lean protein each day, such as lean meat, chicken, fish, eggs, or tofu. One ounce (oz) of lean protein is equal to: ? 1 oz (28 g) of meat, chicken, or fish. ? 1 egg. ?  cup (62 g) of tofu.  Eat some foods each day that contain healthy fats, such as avocado, nuts, seeds, and fish.   What foods should I eat? Fruits Berries. Apples. Oranges. Peaches. Apricots. Plums. Grapes. Mango. Papaya. Pomegranate. Kiwi. Cherries. Vegetables Lettuce. Spinach. Leafy greens, including kale, chard, collard greens, and mustard greens. Beets. Cauliflower. Cabbage. Broccoli. Carrots. Green beans. Tomatoes. Peppers. Onions. Cucumbers. Brussels sprouts. Grains Whole grains, such as whole-wheat or whole-grain bread, crackers, tortillas, cereal, and pasta. Unsweetened oatmeal. Quinoa. Brown or wild rice. Meats and other proteins Seafood. Poultry without skin. Lean cuts of poultry and beef. Tofu. Nuts. Seeds. Dairy Low-fat or fat-free dairy products such as milk, yogurt, and cheese. The items listed above may not be a complete list of foods and beverages you can eat. Contact a dietitian for more information. What foods should I avoid? Fruits Fruits canned with  syrup. Vegetables Canned vegetables. Frozen vegetables with butter or cream sauce. Grains Refined white flour and flour products such as bread, pasta, snack foods, and cereals. Avoid all processed foods. Meats and other proteins Fatty cuts of meat. Poultry with skin. Breaded or fried meats. Processed meat. Avoid saturated fats. Dairy Full-fat yogurt, cheese, or milk. Beverages Sweetened drinks, such as soda or iced tea. The items listed above may not be a complete list of foods and beverages you should avoid. Contact a dietitian for more information. Questions to ask a health care provider  Do I need to meet with a diabetes educator?  Do I need to meet with a dietitian?  What number can I call if I have questions?  When are the best times to check my blood glucose? Where to find more information:  American Diabetes Association: diabetes.org  Academy of Nutrition and Dietetics: www.eatright.org  National Institute of Diabetes and Digestive and Kidney Diseases: www.niddk.nih.gov  Association of Diabetes Care and Education Specialists: www.diabeteseducator.org Summary  It is important to have healthy eating   habits because your blood sugar (glucose) levels are greatly affected by what you eat and drink.  A healthy meal plan will help you control your blood glucose and maintain a healthy lifestyle.  Your health care provider may recommend that you work with a dietitian to make a meal plan that is best for you.  Keep in mind that carbohydrates (carbs) and alcohol have immediate effects on your blood glucose levels. It is important to count carbs and to use alcohol carefully. This information is not intended to replace advice given to you by your health care provider. Make sure you discuss any questions you have with your health care provider. Document Revised: 05/23/2019 Document Reviewed: 05/23/2019 [employer] Patient Education  2021 [employer]  

## 2021-03-19 ENCOUNTER — Other Ambulatory Visit: Payer: Self-pay

## 2021-03-19 ENCOUNTER — Ambulatory Visit (INDEPENDENT_AMBULATORY_CARE_PROVIDER_SITE_OTHER): Payer: Medicare Other | Admitting: Nurse Practitioner

## 2021-03-19 ENCOUNTER — Encounter: Payer: Self-pay | Admitting: Nurse Practitioner

## 2021-03-19 VITALS — BP 143/77 | HR 86 | Ht 64.0 in | Wt 195.8 lb

## 2021-03-19 DIAGNOSIS — I10 Essential (primary) hypertension: Secondary | ICD-10-CM

## 2021-03-19 DIAGNOSIS — E782 Mixed hyperlipidemia: Secondary | ICD-10-CM | POA: Diagnosis not present

## 2021-03-19 DIAGNOSIS — E1165 Type 2 diabetes mellitus with hyperglycemia: Secondary | ICD-10-CM

## 2021-03-19 MED ORDER — FIASP FLEXTOUCH 100 UNIT/ML ~~LOC~~ SOPN: 4.0000 [IU] | PEN_INJECTOR | Freq: Three times a day (TID) | SUBCUTANEOUS | 3 refills | Status: DC

## 2021-03-19 MED ORDER — LEVEMIR FLEXTOUCH 100 UNIT/ML ~~LOC~~ SOPN: 20.0000 [IU] | PEN_INJECTOR | Freq: Every day | SUBCUTANEOUS | 3 refills | Status: DC

## 2021-03-19 NOTE — Progress Notes (Signed)
Endocrinology follow-up note       03/19/2021, 10:51 AM   Subjective:    Patient ID: Kari Miles, female    DOB: [birth year].  Kari Miles is being seen in follow-up for management of chronically uncontrolled type 2 diabetes.  She also has hypertension, hyperlipidemia on treatment.    PMD:  Rosita Fire, MD.   Past Medical History:  Diagnosis Date   Arthritis    Carpal tunnel syndrome    Chronic back pain    Diabetes mellitus without complication ([place])    Hypertension    Tendonitis    Past Surgical History:  Procedure Laterality Date   ABDOMINAL HYSTERECTOMY     CESAREAN SECTION     ORTHOPEDIC SURGERY     Social History   Socioeconomic History   Marital status: Widowed    Spouse name: Not on file   Number of children: Not on file   Years of education: Not on file   Highest education level: Not on file  Occupational History   Not on file  Tobacco Use   Smoking status: Every Day    Packs/day: 1.00    Years: 30.00    Pack years: 30.00    Types: Cigarettes   Smokeless tobacco: Never  Vaping Use   Vaping Use: Never used  Substance and Sexual Activity   Alcohol use: No   Drug use: No   Sexual activity: Not on file  Other Topics Concern   Not on file  Social History Narrative   Not on file   Social Determinants of Health   Financial Resource Strain: Not on file  Food Insecurity: Not on file  Transportation Needs: Not on file  Physical Activity: Not on file  Stress: Not on file  Social Connections: Not on file    Family History  Problem Relation Age of Onset   Seizures Mother    Heart disease Father    Cancer Sister    Heart attack Sister    Colon cancer Neg Hx    Outpatient Encounter Medications as of 03/19/2021  Medication Sig   albuterol (PROVENTIL HFA;VENTOLIN HFA) 108 (90 Base) MCG/ACT inhaler Inhale 2 puffs into the lungs every 6 (six) hours as needed for wheezing or  shortness of breath.   Alcohol Swabs 70 % PADS Use as directed to clean skin prior to injections tid and checking blood glucose tid   amLODipine (NORVASC) 5 MG tablet Take 5 mg by mouth daily.   aspirin 81 MG EC tablet Take by mouth.   aspirin EC 81 MG tablet Take 81 mg by mouth daily.   atorvastatin (LIPITOR) 40 MG tablet Take 1 tablet (40 mg total) by mouth daily.   blood glucose meter kit and supplies KIT Dispense based on patient and insurance preference. Use four times daily as directed.   Blood Glucose Monitoring Suppl (ONETOUCH VERIO REFLECT) w/Device KIT by Does not apply route in the morning, at noon, in the evening, and at bedtime.   butalbital-acetaminophen-caffeine (FIORICET) 50-325-40 MG tablet TAKE 1 OR 2 TABLETS BY MOUTH EVERY 4 HOURS AS NEEDED   cloNIDine (CATAPRES) 0.1 MG tablet Take 0.1 mg by mouth 2 (two) times daily.   cyclobenzaprine (  FLEXERIL) 5 MG tablet Take 5 mg by mouth 3 (three) times daily as needed.   diphenhydrAMINE (BENADRYL) 25 MG tablet Take 25 mg by mouth daily as needed for allergies.   diphenhydrAMINE (SOMINEX) 25 MG tablet Take by mouth.   famotidine (PEPCID) 20 MG tablet Take 20 mg by mouth 2 (two) times daily.   gabapentin (NEURONTIN) 300 MG capsule Take 300 mg by mouth 3 (three) times daily.   glipiZIDE (GLUCOTROL XL) 10 MG 24 hr tablet Take 1 tablet (10 mg total) by mouth daily with breakfast.   glucose blood test strip 1 each by Other route 4 (four) times daily. Use as instructed 4 x daily. E11.65 True Track   hydrochlorothiazide (HYDRODIURIL) 25 MG tablet Take 25 mg by mouth daily.   ibuprofen (ADVIL) 800 MG tablet Take 800 mg by mouth 2 (two) times daily as needed.   Ibuprofen-diphenhydrAMINE Cit 200-38 MG TABS Take 1 tablet by mouth at bedtime as needed (sleep).   insulin aspart (FIASP FLEXTOUCH) 100 UNIT/ML FlexTouch Pen Inject 4-10 Units into the skin 3 (three) times daily with meals.   insulin detemir (LEVEMIR FLEXTOUCH) 100 UNIT/ML FlexPen Inject  20 Units into the skin at bedtime.   Lancet Devices (SIMPLE DIAGNOSTICS LANCING DEV) MISC Use 1 device As directed To test your blood sugar   lisinopril (ZESTRIL) 40 MG tablet Take 40 mg by mouth daily.   metFORMIN (GLUCOPHAGE) 500 MG tablet TAKE ONE TABLET BY MOUTH TWICE DAILY. TAKE WITH A MEAL. (Patient taking differently: Take 500 mg by mouth daily at 12 noon.)   metoCLOPramide (REGLAN) 5 MG tablet Take 5 mg by mouth 3 (three) times daily.   omeprazole (PRILOSEC) 20 MG capsule 1 PO 30 MINS PRIOR TO BREAKFAST. (Patient taking differently: Take 20 mg by mouth daily as needed (reflux).)   ondansetron (ZOFRAN) 8 MG tablet Take 8 mg by mouth every 6 (six) hours as needed.   ondansetron (ZOFRAN-ODT) 4 MG disintegrating tablet Take 4 mg by mouth every 8 (eight) hours as needed.   pantoprazole (PROTONIX) 40 MG tablet Take 40 mg by mouth daily.   Polyethylene Glycol 400 0.25 % SOLN Place 1 drop into both eyes every 6 (six) hours as needed (dry eyes).    spironolactone (ALDACTONE) 25 MG tablet Take 25 mg by mouth 2 (two) times daily.   [DISCONTINUED] NOVOLOG FLEXPEN 100 UNIT/ML FlexPen SMARTSIG:0-20 Unit(s) SUB-Q 4 Times Daily   [DISCONTINUED] amLODipine (NORVASC) 10 MG tablet Take 10 mg by mouth daily. (Patient not taking: Reported on 03/19/2021)   [DISCONTINUED] cloNIDine (CATAPRES) 0.2 MG tablet Take 0.2 mg by mouth 2 (two) times daily. (Patient not taking: Reported on 03/19/2021)   [DISCONTINUED] Continuous Blood Gluc Sensor (FREESTYLE LIBRE 14 DAY SENSOR) MISC Inject 1 each into the skin every 14 (fourteen) days. Use as directed. (Patient not taking: Reported on 03/19/2021)   [DISCONTINUED] insulin regular human CONCENTRATED (HUMULIN R U-500 KWIKPEN) 500 UNIT/ML kwikpen Inject 100 Units into the skin 3 (three) times daily with meals. (Patient not taking: Reported on 03/19/2021)   No facility-administered encounter medications on file as of 03/19/2021.    ALLERGIES: No Known Allergies  VACCINATION  STATUS: Immunization History  Administered Date(s) Administered   Influenza-Unspecified 08/28/2018   Moderna Sars-Covid-2 Vaccination 09/21/2019, 10/19/2019    Diabetes She presents for her follow-up diabetic visit. She has type 2 diabetes mellitus. Onset time: she was diagnosed at approximate age of 2 years. Her disease course has been fluctuating. Hypoglycemia symptoms include nervousness/anxiousness, sweats and  tremors. Pertinent negatives for hypoglycemia include no headaches, pallor or seizures. Associated symptoms include foot paresthesias, polydipsia and polyuria. Pertinent negatives for diabetes include no chest pain and no polyphagia. Hypoglycemia complications include hospitalization and nocturnal hypoglycemia. Symptoms are stable. Diabetic complications include nephropathy and peripheral neuropathy. Risk factors for coronary artery disease include diabetes mellitus, dyslipidemia, family history, hypertension, obesity, sedentary lifestyle, tobacco exposure and post-menopausal. Current diabetic treatment includes intensive insulin program and oral agent (dual therapy). She is compliant with treatment most of the time. Her weight is increasing steadily. She is following a generally unhealthy (still consumes sodas (has cut back)) diet. When asked about meal planning, she reported none. She has not had a previous visit with a dietitian. She never participates in exercise. Her home blood glucose trend is fluctuating dramatically. Her breakfast blood glucose range is generally >200 mg/dl. Her lunch blood glucose range is generally >200 mg/dl. Her dinner blood glucose range is generally >200 mg/dl. (She presents today with her meter and logs showing rebounding, significantly above target glycemic profile overall.  Her most recent A1c was 7.2% on 03/05/21, checked during recent hospitalization where she was admitted for hypoglycemia and dehydration.  Several of her medications were changed as a result of  that hospital visit.  She was taken off her Glipizide and U-500 and just given directions to take 1 Metformin 500 mg po once daily and follow SSI with Novolog at each meal.  Since her discharge, her glucose readings have substantially increased, says her appetite is back. ) An ACE inhibitor/angiotensin II receptor blocker is being taken. She does not see a podiatrist.Eye exam is not current.  Hypertension This is a chronic problem. The current episode started more than 1 year ago. The problem has been waxing and waning since onset. The problem is uncontrolled. Associated symptoms include sweats. Pertinent negatives include no chest pain, headaches, palpitations or shortness of breath. Risk factors for coronary artery disease include diabetes mellitus, obesity, sedentary lifestyle, smoking/tobacco exposure, family history, dyslipidemia and stress. Past treatments include ACE inhibitors, calcium channel blockers and diuretics. The current treatment provides moderate improvement. Compliance problems include psychosocial issues and diet.    Review of systems  Constitutional: + stable body weight,  current Body mass index is 33.61 kg/m. , + fatigue, no subjective hyperthermia, no subjective hypothermia Eyes: no blurry vision, no xerophthalmia ENT: no sore throat, no nodules palpated in throat, no dysphagia/odynophagia, no hoarseness Cardiovascular: no chest pain, no shortness of breath, no palpitations, no leg swelling Respiratory: no cough, no shortness of breath Gastrointestinal: no nausea/vomiting/diarrhea Musculoskeletal: no muscle/joint aches, complaining of left leg pain (has history of lower back problems)- walks with cane Skin: no rashes, no hyperemia Neurological: no tremors, no dizziness, + numbness/tingling/cold sensation in bilateral feet. Psychiatric: no depression, no anxiety   Objective:    BP (!) 143/77   Pulse 86   Ht '5\' 4"'  (1.626 m)   Wt 195 lb 12.8 oz (88.8 kg)   BMI 33.61  kg/m   Wt Readings from Last 3 Encounters:  03/19/21 195 lb 12.8 oz (88.8 kg)  11/22/20 197 lb 3.2 oz (89.4 kg)  08/20/20 186 lb 3.2 oz (84.5 kg)    BP Readings from Last 3 Encounters:  03/19/21 (!) 143/77  11/22/20 111/74  08/20/20 (!) 144/87    Physical Exam- Limited  Constitutional:  Body mass index is 33.61 kg/m. , not in acute distress, normal state of mind Eyes:  EOMI, no exophthalmos Neck: Supple Cardiovascular: RRR, no murmurs,  rubs, or gallops, no edema Respiratory: Adequate breathing efforts, no crackles, rales, rhonchi, or wheezing Musculoskeletal: no gross deformities, strength intact in all four extremities, no gross restriction of joint movements, walks with cane Skin:  no rashes, no hyperemia Neurological: no tremor with outstretched hands   POCT ABI Results 03/19/21   Right ABI:  1.16      Left ABI:  1.13  Right leg systolic / diastolic: 062/37 mmHg Left leg systolic / diastolic: 628/31 mmHg  Arm systolic / diastolic: 517/61 mmHG  Detailed report will be scanned into patient chart.    CMP ( most recent) CMP     Component Value Date/Time   NA 129 (L) 02/05/2021 1024   NA 135 (A) 11/15/2020 0000   K 3.8 02/05/2021 1024   CL 88 (L) 02/05/2021 1024   CO2 29 02/05/2021 1024   GLUCOSE 134 (H) 02/05/2021 1024   BUN 14 02/05/2021 1024   BUN 16 11/15/2020 0000   CREATININE 1.02 (H) 02/05/2021 1024   CREATININE 0.56 09/22/2017 1130   CALCIUM 9.6 02/05/2021 1024   PROT 7.1 09/22/2017 1130   ALBUMIN 3.7 11/15/2020 0000   AST 13 11/15/2020 0000   ALT 19 11/15/2020 0000   ALKPHOS 50 11/15/2020 0000   BILITOT 0.6 09/22/2017 1130   GFRNONAA >60 02/05/2021 1024   GFRNONAA 104 09/22/2017 1130   GFRAA 64 11/15/2020 0000   GFRAA 120 09/22/2017 1130    Diabetic Labs (most recent): Lab Results  Component Value Date   HGBA1C 9.1 (H) 02/05/2021   HGBA1C 10.7 (A) 08/20/2020   HGBA1C 10.9 (A) 04/30/2020     Lipid Panel     Component Value Date/Time    CHOL 94 12/01/2018 0000   TRIG 68 05/14/2020 0000   HDL 42 05/14/2020 0000   LDLCALC 114 05/14/2020 0000      Assessment & Plan:   1) Uncontrolled type 2 diabetes mellitus with hyperglycemia ([place])  - [place] has currently uncontrolled symptomatic type 2 DM since  60 years of age.  She presents today with her meter and logs showing rebounding, significantly above target glycemic profile overall.  Her most recent A1c was 7.2% on 03/05/21, checked during recent hospitalization where she was admitted for hypoglycemia and dehydration.  Several of her medications were changed as a result of that hospital visit.  She was taken off her Glipizide and U-500 and just given directions to take 1 Metformin 500 mg po once daily and follow SSI with Novolog at each meal.  Since her discharge, her glucose readings have substantially increased, says her appetite is back.  her diabetes is complicated by noncompliance/nonadherence, chronic heavy smoking and DEVOTA VIRUET remains at a high risk for more acute and chronic complications which include CAD, CVA, CKD, retinopathy, and neuropathy. These are all discussed in detail with the patient.  - Nutritional counseling repeated at each appointment due to patients tendency to fall back in to old habits.  - The patient admits there is a room for improvement in their diet and drink choices. -  Suggestion is made for the patient to avoid simple carbohydrates from their diet including Cakes, Sweet Desserts / Pastries, Ice Cream, Soda (diet and regular), Sweet Tea, Candies, Chips, Cookies, Sweet Pastries, Store Bought Juices, Alcohol in Excess of 1-2 drinks a day, Artificial Sweeteners, Coffee Creamer, and "Sugar-free" Products. This will help patient to have stable blood glucose profile and potentially avoid unintended weight gain.   - I encouraged the patient  to switch to unprocessed or minimally processed complex starch and increased protein intake (animal or  plant source), fruits, and vegetables.   - Patient is advised to stick to a routine mealtimes to eat 3 meals a day and avoid unnecessary snacks (to snack only to correct hypoglycemia).  - I have approached her with the following individualized plan to manage diabetes and patient agrees:   -Given her current and persisting glycemic burden, she will continue to require intensive treatment with multiple daily injections of insulin and oral therapies in order for her to achieve control of diabetes to target.  -I re-inititiated basal/bolus insulin treatment with Levemir 20 units SQ nightly and adjusted her [employer] (what her insurance prefers) to 4-10 units TID with meals if glucose is above 90 and she is eating (Specific instructions on how to titrate insulin dosage based on glucose readings given to patient in writing).  She is advised to stay off the Glipizide for now given her recent hospitalization for hypoglycemia and is advised to stay on Metformin 500 mg po once daily with breakfast.    -She is encouraged to continue monitoring blood glucose 4 times daily, before meals and before bed, and to call the clinic if she has readings less than 70 or above 300 for 3 tests in a row.  Her Libre refill was denied.  She would like for me to try again for CGM coverage.  She could definitely benefit from one given her severe hypoglycemia.  I resubmitted a request through [employer] for Dexcom.  - Patient is warned not to take insulin without proper monitoring per orders. -Adjustment parameters are given for hypo and hyperglycemia in writing.  -- she not a suitable candidate for incretin therapy given her chronic heavy smoking.   - Patient specific target  A1c;  LDL, HDL, Triglycerides, and  Waist Circumference were discussed in detail.  2) BP/HTN:  Her blood pressure is not controlled to target but is improving.  She is advised to continue Norvasc 5 mg po daily, HCTZ 25 mg po daily, Spironolactone 25 mg po twice  daily, Clonidine 0.1 mg po twice daily and Lisinopril 40 mg po daily.    3) Lipids/HPL:  Her most recent lipid panel from 08/19/20 shows uncontrolled LDL of 136 (worsening since last visit).  She is advised to continue her dose of Lipitor to 40 mg po daily at bedtime.  Side effects and precautions discussed with her.  4)  Weight/Diet: Her Body mass index is 33.61 kg/m.- clearly complicating her diabetes care.  She is a candidate for modest weight loss.  CDE Consult will be initiated , exercise, and detailed carbohydrates information provided.  She has missed her appointments with Jearld Fenton, RDE due to transportation issues.    5) Chronic Care/Health Maintenance: -she  is on ACEI medications and statin is encouraged to continue to follow up with Ophthalmology, Dentist,  Podiatrist at least yearly or according to recommendations, and advised to quit smoking. I have recommended yearly flu vaccine and pneumonia vaccination at least every 5 years; moderate intensity exercise for up to 150 minutes weekly; and  sleep for at least 7 hours a day.  -The patient was counseled on the dangers of tobacco use, and was advised to quit.  Reviewed strategies to maximize success, including removing cigarettes and smoking materials from environment.  - I advised patient to maintain close follow up with Rosita Fire, MD for primary care needs.      I spent 45 minutes  in the care of the patient today including review of labs from [place], Lipids, Thyroid Function, Hematology (current and previous including abstractions from other facilities); face-to-face time discussing  her blood glucose readings/logs, discussing hypoglycemia and hyperglycemia episodes and symptoms, medications doses, her options of short and long term treatment based on the latest standards of care / guidelines;  discussion about incorporating lifestyle medicine;  and documenting the encounter.    Please refer to Patient Instructions for Blood  Glucose Monitoring and Insulin/Medications Dosing Guide"  in media tab for additional information. Please  also refer to " Patient Self Inventory" in the Media  tab for reviewed elements of pertinent patient history.  Kari Miles participated in the discussions, expressed understanding, and voiced agreement with the above plans.  All questions were answered to her satisfaction. she is encouraged to contact clinic should she have any questions or concerns prior to her return visit.    Follow up plan: Return in about 1 month (around 04/18/2021) for Diabetes F/U, Bring meter and logs, No previsit labs.   Rayetta Pigg, [facility] [address] [place], [place] [address] Phone: [phone] Fax: [phone]  03/19/2021, 10:51 AM

## 2021-03-24 DIAGNOSIS — Z23 Encounter for immunization: Secondary | ICD-10-CM | POA: Diagnosis not present

## 2021-03-24 DIAGNOSIS — K5904 Chronic idiopathic constipation: Secondary | ICD-10-CM | POA: Diagnosis not present

## 2021-03-24 DIAGNOSIS — I10 Essential (primary) hypertension: Secondary | ICD-10-CM | POA: Diagnosis not present

## 2021-03-24 DIAGNOSIS — N179 Acute kidney failure, unspecified: Secondary | ICD-10-CM | POA: Diagnosis not present

## 2021-03-24 DIAGNOSIS — E11649 Type 2 diabetes mellitus with hypoglycemia without coma: Secondary | ICD-10-CM | POA: Diagnosis not present

## 2021-03-26 DIAGNOSIS — J45909 Unspecified asthma, uncomplicated: Secondary | ICD-10-CM | POA: Diagnosis not present

## 2021-03-26 DIAGNOSIS — I10 Essential (primary) hypertension: Secondary | ICD-10-CM | POA: Diagnosis not present

## 2021-03-26 DIAGNOSIS — E11649 Type 2 diabetes mellitus with hypoglycemia without coma: Secondary | ICD-10-CM | POA: Diagnosis not present

## 2021-03-26 DIAGNOSIS — M1990 Unspecified osteoarthritis, unspecified site: Secondary | ICD-10-CM | POA: Diagnosis not present

## 2021-03-26 DIAGNOSIS — N179 Acute kidney failure, unspecified: Secondary | ICD-10-CM | POA: Diagnosis not present

## 2021-03-26 DIAGNOSIS — Z87891 Personal history of nicotine dependence: Secondary | ICD-10-CM | POA: Diagnosis not present

## 2021-03-27 DIAGNOSIS — E11649 Type 2 diabetes mellitus with hypoglycemia without coma: Secondary | ICD-10-CM | POA: Diagnosis not present

## 2021-03-27 DIAGNOSIS — J45909 Unspecified asthma, uncomplicated: Secondary | ICD-10-CM | POA: Diagnosis not present

## 2021-03-27 DIAGNOSIS — I10 Essential (primary) hypertension: Secondary | ICD-10-CM | POA: Diagnosis not present

## 2021-03-27 DIAGNOSIS — N179 Acute kidney failure, unspecified: Secondary | ICD-10-CM | POA: Diagnosis not present

## 2021-03-27 DIAGNOSIS — M1990 Unspecified osteoarthritis, unspecified site: Secondary | ICD-10-CM | POA: Diagnosis not present

## 2021-03-27 DIAGNOSIS — Z87891 Personal history of nicotine dependence: Secondary | ICD-10-CM | POA: Diagnosis not present

## 2021-03-28 DIAGNOSIS — R9431 Abnormal electrocardiogram [ECG] [EKG]: Secondary | ICD-10-CM | POA: Diagnosis not present

## 2021-03-28 DIAGNOSIS — Z20822 Contact with and (suspected) exposure to covid-19: Secondary | ICD-10-CM | POA: Diagnosis not present

## 2021-03-28 DIAGNOSIS — R0789 Other chest pain: Secondary | ICD-10-CM | POA: Diagnosis not present

## 2021-03-28 DIAGNOSIS — R079 Chest pain, unspecified: Secondary | ICD-10-CM | POA: Diagnosis not present

## 2021-03-28 DIAGNOSIS — Z5941 Food insecurity: Secondary | ICD-10-CM | POA: Diagnosis not present

## 2021-03-28 DIAGNOSIS — I10 Essential (primary) hypertension: Secondary | ICD-10-CM | POA: Diagnosis not present

## 2021-03-28 DIAGNOSIS — R000 Tachycardia, unspecified: Secondary | ICD-10-CM | POA: Diagnosis not present

## 2021-03-28 DIAGNOSIS — N390 Urinary tract infection, site not specified: Secondary | ICD-10-CM | POA: Diagnosis not present

## 2021-03-28 DIAGNOSIS — F17210 Nicotine dependence, cigarettes, uncomplicated: Secondary | ICD-10-CM | POA: Diagnosis not present

## 2021-03-28 DIAGNOSIS — E119 Type 2 diabetes mellitus without complications: Secondary | ICD-10-CM | POA: Diagnosis not present

## 2021-03-28 DIAGNOSIS — K219 Gastro-esophageal reflux disease without esophagitis: Secondary | ICD-10-CM | POA: Diagnosis not present

## 2021-04-01 ENCOUNTER — Emergency Department (HOSPITAL_COMMUNITY): Payer: Medicare Other

## 2021-04-01 ENCOUNTER — Other Ambulatory Visit: Payer: Self-pay

## 2021-04-01 ENCOUNTER — Encounter (HOSPITAL_COMMUNITY): Payer: Self-pay | Admitting: *Deleted

## 2021-04-01 ENCOUNTER — Emergency Department (HOSPITAL_COMMUNITY)
Admission: EM | Admit: 2021-04-01 | Discharge: 2021-04-01 | Disposition: A | Discharge status: Home / Self Care | Payer: Medicare Other | Attending: Emergency Medicine | Admitting: Emergency Medicine

## 2021-04-01 DIAGNOSIS — R079 Chest pain, unspecified: Secondary | ICD-10-CM | POA: Diagnosis not present

## 2021-04-01 DIAGNOSIS — R1013 Epigastric pain: Secondary | ICD-10-CM | POA: Insufficient documentation

## 2021-04-01 DIAGNOSIS — Z79899 Other long term (current) drug therapy: Secondary | ICD-10-CM | POA: Insufficient documentation

## 2021-04-01 DIAGNOSIS — Z20822 Contact with and (suspected) exposure to covid-19: Secondary | ICD-10-CM | POA: Diagnosis not present

## 2021-04-01 DIAGNOSIS — F17210 Nicotine dependence, cigarettes, uncomplicated: Secondary | ICD-10-CM | POA: Insufficient documentation

## 2021-04-01 DIAGNOSIS — E876 Hypokalemia: Secondary | ICD-10-CM | POA: Insufficient documentation

## 2021-04-01 DIAGNOSIS — I10 Essential (primary) hypertension: Secondary | ICD-10-CM | POA: Diagnosis not present

## 2021-04-01 DIAGNOSIS — R0689 Other abnormalities of breathing: Secondary | ICD-10-CM | POA: Diagnosis not present

## 2021-04-01 DIAGNOSIS — N179 Acute kidney failure, unspecified: Secondary | ICD-10-CM | POA: Diagnosis not present

## 2021-04-01 DIAGNOSIS — Z7982 Long term (current) use of aspirin: Secondary | ICD-10-CM | POA: Diagnosis not present

## 2021-04-01 DIAGNOSIS — E11649 Type 2 diabetes mellitus with hypoglycemia without coma: Secondary | ICD-10-CM | POA: Diagnosis not present

## 2021-04-01 DIAGNOSIS — E119 Type 2 diabetes mellitus without complications: Secondary | ICD-10-CM | POA: Insufficient documentation

## 2021-04-01 DIAGNOSIS — I959 Hypotension, unspecified: Secondary | ICD-10-CM | POA: Diagnosis not present

## 2021-04-01 DIAGNOSIS — R0789 Other chest pain: Secondary | ICD-10-CM | POA: Diagnosis not present

## 2021-04-01 DIAGNOSIS — R1110 Vomiting, unspecified: Secondary | ICD-10-CM | POA: Insufficient documentation

## 2021-04-01 DIAGNOSIS — Z87891 Personal history of nicotine dependence: Secondary | ICD-10-CM | POA: Diagnosis not present

## 2021-04-01 DIAGNOSIS — J45909 Unspecified asthma, uncomplicated: Secondary | ICD-10-CM | POA: Diagnosis not present

## 2021-04-01 DIAGNOSIS — M1990 Unspecified osteoarthritis, unspecified site: Secondary | ICD-10-CM | POA: Diagnosis not present

## 2021-04-01 DIAGNOSIS — Z794 Long term (current) use of insulin: Secondary | ICD-10-CM | POA: Insufficient documentation

## 2021-04-01 DIAGNOSIS — Z7984 Long term (current) use of oral hypoglycemic drugs: Secondary | ICD-10-CM | POA: Diagnosis not present

## 2021-04-01 LAB — RESP PANEL BY RT-PCR (FLU A&B, COVID) ARPGX2
Influenza A by PCR: NEGATIVE
Influenza B by PCR: NEGATIVE
SARS Coronavirus 2 by RT PCR: NEGATIVE

## 2021-04-01 LAB — CBC WITH DIFFERENTIAL/PLATELET
Abs Immature Granulocytes: 0.05 10*3/uL (ref 0.00–0.07)
Basophils Absolute: 0.1 10*3/uL (ref 0.0–0.1)
Basophils Relative: 1 %
Eosinophils Absolute: 0.1 10*3/uL (ref 0.0–0.5)
Eosinophils Relative: 1 %
HCT: 39.3 % (ref 36.0–46.0)
Hemoglobin: 13.2 g/dL (ref 12.0–15.0)
Immature Granulocytes: 1 %
Lymphocytes Relative: 41 %
Lymphs Abs: 4.2 10*3/uL — ABNORMAL HIGH (ref 0.7–4.0)
MCH: 29.5 pg (ref 26.0–34.0)
MCHC: 33.6 g/dL (ref 30.0–36.0)
MCV: 87.9 fL (ref 80.0–100.0)
Monocytes Absolute: 0.6 10*3/uL (ref 0.1–1.0)
Monocytes Relative: 6 %
Neutro Abs: 5.1 10*3/uL (ref 1.7–7.7)
Neutrophils Relative %: 50 %
Platelets: 322 10*3/uL (ref 150–400)
RBC: 4.47 MIL/uL (ref 3.87–5.11)
RDW: 12 % (ref 11.5–15.5)
WBC: 10.2 10*3/uL (ref 4.0–10.5)
nRBC: 0 % (ref 0.0–0.2)

## 2021-04-01 LAB — COMPREHENSIVE METABOLIC PANEL
ALT: 22 U/L (ref 0–44)
AST: 17 U/L (ref 15–41)
Albumin: 4.5 g/dL (ref 3.5–5.0)
Alkaline Phosphatase: 51 U/L (ref 38–126)
Anion gap: 10 (ref 5–15)
BUN: 33 mg/dL — ABNORMAL HIGH (ref 8–23)
CO2: 32 mmol/L (ref 22–32)
Calcium: 9.6 mg/dL (ref 8.9–10.3)
Chloride: 92 mmol/L — ABNORMAL LOW (ref 98–111)
Creatinine, Ser: 1.51 mg/dL — ABNORMAL HIGH (ref 0.44–1.00)
GFR, Estimated: 39 mL/min — ABNORMAL LOW (ref >60)
Glucose, Bld: 152 mg/dL — ABNORMAL HIGH (ref 70–99)
Potassium: 3 mmol/L — ABNORMAL LOW (ref 3.5–5.1)
Sodium: 134 mmol/L — ABNORMAL LOW (ref 135–145)
Total Bilirubin: 1.1 mg/dL (ref 0.3–1.2)
Total Protein: 7.7 g/dL (ref 6.5–8.1)

## 2021-04-01 LAB — URINALYSIS, ROUTINE W REFLEX MICROSCOPIC
Bilirubin Urine: NEGATIVE
Glucose, UA: 50 mg/dL — AB
Hgb urine dipstick: NEGATIVE
Ketones, ur: NEGATIVE mg/dL
Leukocytes,Ua: NEGATIVE
Nitrite: NEGATIVE
Protein, ur: 30 mg/dL — AB
Specific Gravity, Urine: 1.018 (ref 1.005–1.030)
pH: 5 (ref 5.0–8.0)

## 2021-04-01 LAB — TROPONIN I (HIGH SENSITIVITY): Troponin I (High Sensitivity): 6 ng/L (ref <18)

## 2021-04-01 LAB — LIPASE, BLOOD: Lipase: 18 U/L (ref 11–51)

## 2021-04-01 MED ORDER — PANTOPRAZOLE SODIUM 40 MG IV SOLR
40.0000 mg | Freq: Once | INTRAVENOUS | Status: AC
  Administered 2021-04-01: 40 mg via INTRAVENOUS
  Filled 2021-04-01: qty 40

## 2021-04-01 MED ORDER — POTASSIUM CHLORIDE CRYS ER 20 MEQ PO TBCR
40.0000 meq | EXTENDED_RELEASE_TABLET | Freq: Once | ORAL | Status: AC
  Administered 2021-04-01: 40 meq via ORAL
  Filled 2021-04-01: qty 2

## 2021-04-01 MED ORDER — LACTATED RINGERS IV BOLUS
1000.0000 mL | Freq: Once | INTRAVENOUS | Status: AC
  Administered 2021-04-01: 1000 mL via INTRAVENOUS

## 2021-04-01 MED ORDER — ALUM & MAG HYDROXIDE-SIMETH 200-200-20 MG/5ML PO SUSP
30.0000 mL | Freq: Once | ORAL | Status: AC
  Administered 2021-04-01: 30 mL via ORAL
  Filled 2021-04-01: qty 30

## 2021-04-01 MED ORDER — ONDANSETRON HCL 4 MG/2ML IJ SOLN
4.0000 mg | Freq: Once | INTRAMUSCULAR | Status: AC
  Administered 2021-04-01: 4 mg via INTRAVENOUS
  Filled 2021-04-01: qty 2

## 2021-04-01 MED ORDER — LIDOCAINE VISCOUS HCL 2 % MT SOLN
15.0000 mL | Freq: Once | OROMUCOSAL | Status: AC
  Administered 2021-04-01: 15 mL via ORAL
  Filled 2021-04-01: qty 15

## 2021-04-01 MED ORDER — OMEPRAZOLE 20 MG PO CPDR: 40.0000 mg | DELAYED_RELEASE_CAPSULE | Freq: Every day | ORAL | 0 refills | Status: AC

## 2021-04-01 MED ORDER — SUCRALFATE 1 GM/10ML PO SUSP: 1.0000 g | Freq: Three times a day (TID) | ORAL | 0 refills | Status: DC

## 2021-04-01 NOTE — ED Triage Notes (Signed)
Pt brought in by [facility] from home with c/o mid chest pain and nausea that started this morning. Pt seen for the same a few days ago at [facility] and was discharged.

## 2021-04-01 NOTE — Discharge Instructions (Addendum)
You were seen in the ER today for your chest pain. Your tests in the ER were very reassuring. There does not appear to be any emergent problems with your heart and lungs at this time.  I suspect the central chest discomfort you are experiencing is secondary to the recent vomiting you been having and possible acid reflux.  I prescribed you medications to treat your acid reflux called omeprazole which you should take daily, and Carafate which you may take as needed for central chest burning.  Additionally your kidney function was found to be somewhat slow today relative to what is normal for you.  You are given fluids to try to help with this.  Please follow-up with your primary care doctor next week for recheck of your kidney function.  No further work-up warranted in ED at this time.  You may follow-up with the cardiologist listed below.  Return to the ER with any new severe symptoms.

## 2021-04-01 NOTE — ED Provider Notes (Addendum)
[facility] EMERGENCY DEPARTMENT Provider Note   CSN: [id number] Arrival date & time: 04/01/21  1052     History Chief Complaint  Patient presents with   Chest Pain    Kari Miles is a 60 y.o. female -who presents via EMS with concern for central chest pain that radiates down into her abdomen but not to her neck or arms.  Nonexertional in nature.  Patient states she has been having reflux with NBNB vomiting for 3 days and belching quite a bit and has history of heartburn does not take any medication for this.  Patient was recently seen at [facility] for the same and diagnosed with atypical chest pain; discharged with cardiology follow-up which patient has not yet had.  I personally read this patient's medical records.  She is history of type 2 diabetes, hypertension, hyperlipidemia.  She is not anticoagulated.  She is vacccinated x 2 for Covid.  HPI     Past Medical History:  Diagnosis Date   Arthritis    Carpal tunnel syndrome    Chronic back pain    Diabetes mellitus without complication ([place])    Hypertension    Tendonitis     Patient Active Problem List   Diagnosis Date Noted   Mixed hyperlipidemia 06/05/2019   Uncontrolled type 2 diabetes mellitus with hyperglycemia ([place]) 07/23/2017   Essential hypertension, benign 07/23/2017   Class 1 obesity due to excess calories with serious comorbidity and body mass index (BMI) of 32.0 to 32.9 in adult 07/23/2017   Personal history of noncompliance with medical treatment, presenting hazards to health 07/23/2017    Past Surgical History:  Procedure Laterality Date   ABDOMINAL HYSTERECTOMY     CESAREAN SECTION     ORTHOPEDIC SURGERY       OB History   No obstetric history on file.     Family History  Problem Relation Age of Onset   Seizures Mother    Heart disease Father    Cancer Sister    Heart attack Sister    Colon cancer Neg Hx     Social History   Tobacco Use   Smoking status: Every Day     Packs/day: 0.10    Years: 30.00    Pack years: 3.00    Types: Cigarettes   Smokeless tobacco: Never  Vaping Use   Vaping Use: Never used  Substance Use Topics   Alcohol use: No   Drug use: No    Home Medications Prior to Admission medications   Medication Sig Start Date End Date Taking? Authorizing Provider  albuterol (PROVENTIL HFA;VENTOLIN HFA) 108 (90 Base) MCG/ACT inhaler Inhale 2 puffs into the lungs every 6 (six) hours as needed for wheezing or shortness of breath.    [provider]  Alcohol Swabs 70 % PADS Use as directed to clean skin prior to injections tid and checking blood glucose tid 01/09/20   Cassandria Anger, MD  amLODipine (NORVASC) 5 MG tablet Take 5 mg by mouth daily. 02/22/21   [provider]  aspirin 81 MG EC tablet Take by mouth.    [provider]  aspirin EC 81 MG tablet Take 81 mg by mouth daily.    [provider]  atorvastatin (LIPITOR) 40 MG tablet Take 1 tablet (40 mg total) by mouth daily. 05/20/20   Brita Romp, NP  blood glucose meter kit and supplies KIT Dispense based on patient and insurance preference. Use four times daily as directed. 01/09/21  Brita Romp, NP  Blood Glucose Monitoring Suppl (ONETOUCH VERIO REFLECT) w/Device KIT by Does not apply route in the morning, at noon, in the evening, and at bedtime.    [provider]  butalbital-acetaminophen-caffeine (FIORICET) 50-325-40 MG tablet TAKE 1 OR 2 TABLETS BY MOUTH EVERY 4 HOURS AS NEEDED 07/23/20   [provider]  cloNIDine (CATAPRES) 0.1 MG tablet Take 0.1 mg by mouth 2 (two) times daily. 02/22/21   [provider]  cyclobenzaprine (FLEXERIL) 5 MG tablet Take 5 mg by mouth 3 (three) times daily as needed. 10/18/20   [provider]  diphenhydrAMINE (BENADRYL) 25 MG tablet Take 25 mg by mouth daily as needed for allergies.    [provider]  diphenhydrAMINE (SOMINEX) 25 MG tablet Take by mouth.     [provider]  famotidine (PEPCID) 20 MG tablet Take 20 mg by mouth 2 (two) times daily. 03/01/21   [provider]  gabapentin (NEURONTIN) 300 MG capsule Take 300 mg by mouth 3 (three) times daily. 02/26/20   [provider]  glipiZIDE (GLUCOTROL XL) 10 MG 24 hr tablet Take 1 tablet (10 mg total) by mouth daily with breakfast. 04/30/20   Brita Romp, NP  glucose blood test strip 1 each by Other route 4 (four) times daily. Use as instructed 4 x daily. E11.65 True Track 07/29/17   Cassandria Anger, MD  hydrochlorothiazide (HYDRODIURIL) 25 MG tablet Take 25 mg by mouth daily.    [provider]  ibuprofen (ADVIL) 800 MG tablet Take 800 mg by mouth 2 (two) times daily as needed. 12/14/19   [provider]  Ibuprofen-diphenhydrAMINE Cit 200-38 MG TABS Take 1 tablet by mouth at bedtime as needed (sleep).    [provider]  insulin aspart (FIASP FLEXTOUCH) 100 UNIT/ML FlexTouch Pen Inject 4-10 Units into the skin 3 (three) times daily with meals. 03/19/21   Brita Romp, NP  insulin detemir (LEVEMIR FLEXTOUCH) 100 UNIT/ML FlexPen Inject 20 Units into the skin at bedtime. 03/19/21   Brita Romp, NP  Lancet Devices (SIMPLE DIAGNOSTICS LANCING DEV) MISC Use 1 device As directed To test your blood sugar 06/07/20   [provider]  lisinopril (ZESTRIL) 40 MG tablet Take 40 mg by mouth daily. 12/01/18   [provider]  metFORMIN (GLUCOPHAGE) 500 MG tablet TAKE ONE TABLET BY MOUTH TWICE DAILY. TAKE WITH A MEAL. Patient taking differently: Take 500 mg by mouth daily at 12 noon. 11/24/17   Cassandria Anger, MD  metoCLOPramide (REGLAN) 5 MG tablet Take 5 mg by mouth 3 (three) times daily. 02/22/21   [provider]  omeprazole (PRILOSEC) 20 MG capsule 1 PO 30 MINS PRIOR TO BREAKFAST. Patient taking differently: Take 20 mg by mouth daily as needed (reflux). 02/10/19   Fields, Marga Melnick, MD  ondansetron (ZOFRAN) 8 MG  tablet Take 8 mg by mouth every 6 (six) hours as needed. 02/27/21   [provider]  ondansetron (ZOFRAN-ODT) 4 MG disintegrating tablet Take 4 mg by mouth every 8 (eight) hours as needed. 03/01/21   [provider]  pantoprazole (PROTONIX) 40 MG tablet Take 40 mg by mouth daily. 02/20/21   [provider]  Polyethylene Glycol 400 0.25 % SOLN Place 1 drop into both eyes every 6 (six) hours as needed (dry eyes).     [provider]  spironolactone (ALDACTONE) 25 MG tablet Take 25 mg by mouth 2 (two) times daily. 08/02/20   [provider]    Allergies    Patient has no known allergies.  Review of Systems   Review of Systems  Constitutional:  Positive for appetite change and chills. Negative for activity change, fatigue and fever.  HENT: Negative.    Eyes: Negative.   Respiratory:  Positive for shortness of breath. Negative for cough, choking and chest tightness.   Cardiovascular:  Positive for chest pain. Negative for palpitations and leg swelling.  Gastrointestinal:  Positive for abdominal pain, constipation, nausea and vomiting. Negative for diarrhea.       Belching  Genitourinary:  Positive for frequency. Negative for decreased urine volume, dysuria and urgency.  Musculoskeletal: Negative.   Neurological: Negative.    Physical Exam Updated Vital Signs BP 114/72   Pulse 81   Temp 98.3 F (36.8 C) (Oral)   Resp 19   Ht _0  (1.626 m)   Wt 88.5 kg   SpO2 100%   BMI 33.47 kg/m   Physical Exam Vitals and nursing note reviewed.  Constitutional:      General: She is not in acute distress.    Appearance: She is obese. She is not ill-appearing or toxic-appearing.  HENT:     Head: Normocephalic and atraumatic.     Nose: Nose normal.     Mouth/Throat:     Mouth: Mucous membranes are moist.     Pharynx: Oropharynx is clear. Uvula midline. No oropharyngeal exudate or posterior oropharyngeal erythema.     Tonsils: No tonsillar exudate.  Eyes:      General: Lids are normal. Vision grossly intact.        Right eye: No discharge.        Left eye: No discharge.     Extraocular Movements: Extraocular movements intact.     Conjunctiva/sclera: Conjunctivae normal.     Pupils: Pupils are equal, round, and reactive to light.  Neck:     Trachea: Trachea and phonation normal.  Cardiovascular:     Rate and Rhythm: Normal rate and regular rhythm.     Pulses: Normal pulses.     Heart sounds: Normal heart sounds. No murmur heard. Pulmonary:     Effort: Pulmonary effort is normal. No tachypnea, bradypnea, accessory muscle usage, prolonged expiration or respiratory distress.     Breath sounds: Normal breath sounds. No wheezing or rales.  Chest:     Chest wall: No mass, lacerations, deformity, swelling, tenderness, crepitus or edema.  Abdominal:     General: Bowel sounds are normal. There is no distension.     Palpations: Abdomen is soft.     Tenderness: There is generalized abdominal tenderness and tenderness in the epigastric area. There is no right CVA tenderness, left CVA tenderness, guarding or rebound.  Musculoskeletal:        General: No deformity.     Cervical back: Normal range of motion and neck supple. No edema, rigidity or crepitus. No pain with movement, spinous process tenderness or muscular tenderness.     Right lower leg: No edema.     Left lower leg: No edema.  Lymphadenopathy:     Cervical: No cervical adenopathy.  Skin:    General: Skin is warm and dry.     Capillary Refill: Capillary refill takes less than 2 seconds.  Neurological:     General: No focal deficit present.     Mental Status: She is alert and oriented to person, place, and time. Mental status is at baseline.  Psychiatric:  Mood and Affect: Mood normal.    ED Results / Procedures / Treatments   Labs (all labs ordered are listed, but only abnormal results are displayed) Labs Reviewed  COMPREHENSIVE METABOLIC PANEL - Abnormal; Notable for the  following components:      Result Value   Sodium 134 (*)    Potassium 3.0 (*)    Chloride 92 (*)    Glucose, Bld 152 (*)    BUN 33 (*)    Creatinine, Ser 1.51 (*)    GFR, Estimated 39 (*)    All other components within normal limits  CBC WITH DIFFERENTIAL/PLATELET - Abnormal; Notable for the following components:   Lymphs Abs 4.2 (*)    All other components within normal limits  URINALYSIS, ROUTINE W REFLEX MICROSCOPIC - Abnormal; Notable for the following components:   APPearance CLOUDY (*)    Glucose, UA 50 (*)    Protein, ur 30 (*)    Bacteria, UA RARE (*)    All other components within normal limits  RESP PANEL BY RT-PCR (FLU A&B, COVID) [id number]  LIPASE, BLOOD  TROPONIN I (HIGH SENSITIVITY)    EKG EKG:  sinus rhythm without ischemic changes.  Radiology DG Chest 2 View  Result Date: 04/01/2021 CLINICAL DATA:  Chest pain EXAM: CHEST - 2 VIEW COMPARISON:  03/28/2021 FINDINGS: The heart size and mediastinal contours are within normal limits. Low lung volumes. No focal pulmonary opacity. No pleural effusion or pneumothorax. No acute osseous abnormality. IMPRESSION: No active cardiopulmonary disease. Electronically Signed   By: Merilyn Baba M.D.   On: 04/01/2021 14:15    Procedures Procedures   Medications Ordered in ED Medications  ondansetron (ZOFRAN) injection 4 mg (4 mg Intravenous Given 04/01/21 1435)  pantoprazole (PROTONIX) injection 40 mg (40 mg Intravenous Given 04/01/21 1435)  lactated ringers bolus 1,000 mL (1,000 mLs Intravenous New Bag/Given 04/01/21 1549)  potassium chloride SA (KLOR-CON) CR tablet 40 mEq (40 mEq Oral Given 04/01/21 1543)  alum & mag hydroxide-simeth (MAALOX/MYLANTA) 200-200-20 MG/5ML suspension 30 mL (30 mLs Oral Given 04/01/21 1544)    And  lidocaine (XYLOCAINE) 2 % viscous mouth solution 15 mL (15 mLs Oral Given 04/01/21 1544)    ED Course  I have reviewed the triage vital signs and the nursing notes.  Pertinent labs & imaging results that  were available during my care of the patient were reviewed by me and considered in my medical decision making (see chart for details).    MDM Rules/Calculators/A&P                         60 year old female presents with concern for left-sided chest pain that started this morning when she was lying in her bed.  Nausea and vomiting x3 days as well, constipation.  Differential diagnosis includes but is limited to ACS, dysrhythmia, PE, pleural effusion, pneumothorax, sepsis, GERD, peptic ulcer disease/esophagitis, pancreatitis.  Vital signs are normal on intake.  Cardiopulmonary exam is normal, abdominal exam is significant for epigastric tenderness palpation as well as generalized mild abdominal tenderness to palpation.  Normal bowel sounds.  Patient is neurovascular intact in all 4 extremities.  We will proceed with chest pain work-up as well as hepatic function and lipase.  CBC unremarkable, CMP with hypokalemia of 3.0, repleted orally. Additionally mild AKI. Cr 1.5, baseline of 1.1. Fluid bolus given. Lipase normal, 18, Troponin negative, 6.   UA without signs of infection.  Patient reevaluated after administration of cocktail with improvement  in her symptoms.  Suspect esophagitis/possible gastritis. Will provide outpatient GERD prescriptions.  Recommend outpatient nausea medication and PCP follow-up for recheck of kidney function in 1 week.  Additionally provided cardiology follow-up as needed.  No further work-up warranted needed time given reassuring physical exam, vital signs, laboratory studies, and EKG.  Nikyah voiced understanding for medical evaluation and treatment plan.  Each of her questions were answered to her expressed inspection.  Return precautions were given.  Patient is well-appearing, stable, and appropriate for discharge at this time.  This chart was dictated using voice recognition software, Dragon. Despite the best efforts of this provider to proofread and correct errors,  errors may still occur which can change documentation meaning.  Final Clinical Impression(s) / ED Diagnoses Final diagnoses:  Atypical chest pain    Rx / DC Orders ED Discharge Orders     None        Emeline Darling, PA-C 04/01/21 1712    Chad Tiznado, Gypsy Balsam, PA-C 04/01/21 1722    Milton Ferguson, MD 04/05/21 1040

## 2021-04-07 DIAGNOSIS — I10 Essential (primary) hypertension: Secondary | ICD-10-CM | POA: Diagnosis not present

## 2021-04-07 DIAGNOSIS — E1140 Type 2 diabetes mellitus with diabetic neuropathy, unspecified: Secondary | ICD-10-CM | POA: Diagnosis not present

## 2021-04-08 DIAGNOSIS — I10 Essential (primary) hypertension: Secondary | ICD-10-CM | POA: Diagnosis not present

## 2021-04-08 DIAGNOSIS — Z87891 Personal history of nicotine dependence: Secondary | ICD-10-CM | POA: Diagnosis not present

## 2021-04-08 DIAGNOSIS — J45909 Unspecified asthma, uncomplicated: Secondary | ICD-10-CM | POA: Diagnosis not present

## 2021-04-08 DIAGNOSIS — N179 Acute kidney failure, unspecified: Secondary | ICD-10-CM | POA: Diagnosis not present

## 2021-04-08 DIAGNOSIS — M1990 Unspecified osteoarthritis, unspecified site: Secondary | ICD-10-CM | POA: Diagnosis not present

## 2021-04-08 DIAGNOSIS — E11649 Type 2 diabetes mellitus with hypoglycemia without coma: Secondary | ICD-10-CM | POA: Diagnosis not present

## 2021-04-09 DIAGNOSIS — M1990 Unspecified osteoarthritis, unspecified site: Secondary | ICD-10-CM | POA: Diagnosis not present

## 2021-04-09 DIAGNOSIS — J45909 Unspecified asthma, uncomplicated: Secondary | ICD-10-CM | POA: Diagnosis not present

## 2021-04-09 DIAGNOSIS — E11649 Type 2 diabetes mellitus with hypoglycemia without coma: Secondary | ICD-10-CM | POA: Diagnosis not present

## 2021-04-09 DIAGNOSIS — I10 Essential (primary) hypertension: Secondary | ICD-10-CM | POA: Diagnosis not present

## 2021-04-09 DIAGNOSIS — Z794 Long term (current) use of insulin: Secondary | ICD-10-CM | POA: Diagnosis not present

## 2021-04-09 DIAGNOSIS — N179 Acute kidney failure, unspecified: Secondary | ICD-10-CM | POA: Diagnosis not present

## 2021-04-09 DIAGNOSIS — Z90710 Acquired absence of both cervix and uterus: Secondary | ICD-10-CM | POA: Diagnosis not present

## 2021-04-09 DIAGNOSIS — Z7984 Long term (current) use of oral hypoglycemic drugs: Secondary | ICD-10-CM | POA: Diagnosis not present

## 2021-04-09 DIAGNOSIS — Z87891 Personal history of nicotine dependence: Secondary | ICD-10-CM | POA: Diagnosis not present

## 2021-04-09 DIAGNOSIS — Z90722 Acquired absence of ovaries, bilateral: Secondary | ICD-10-CM | POA: Diagnosis not present

## 2021-04-10 DIAGNOSIS — R9431 Abnormal electrocardiogram [ECG] [EKG]: Secondary | ICD-10-CM | POA: Diagnosis not present

## 2021-04-10 DIAGNOSIS — I10 Essential (primary) hypertension: Secondary | ICD-10-CM | POA: Diagnosis not present

## 2021-04-10 DIAGNOSIS — E1165 Type 2 diabetes mellitus with hyperglycemia: Secondary | ICD-10-CM | POA: Diagnosis not present

## 2021-04-10 DIAGNOSIS — R0789 Other chest pain: Secondary | ICD-10-CM | POA: Diagnosis not present

## 2021-04-10 DIAGNOSIS — E782 Mixed hyperlipidemia: Secondary | ICD-10-CM | POA: Diagnosis not present

## 2021-04-11 DIAGNOSIS — M1990 Unspecified osteoarthritis, unspecified site: Secondary | ICD-10-CM | POA: Diagnosis not present

## 2021-04-11 DIAGNOSIS — Z87891 Personal history of nicotine dependence: Secondary | ICD-10-CM | POA: Diagnosis not present

## 2021-04-11 DIAGNOSIS — J45909 Unspecified asthma, uncomplicated: Secondary | ICD-10-CM | POA: Diagnosis not present

## 2021-04-11 DIAGNOSIS — I10 Essential (primary) hypertension: Secondary | ICD-10-CM | POA: Diagnosis not present

## 2021-04-11 DIAGNOSIS — E11649 Type 2 diabetes mellitus with hypoglycemia without coma: Secondary | ICD-10-CM | POA: Diagnosis not present

## 2021-04-11 DIAGNOSIS — N179 Acute kidney failure, unspecified: Secondary | ICD-10-CM | POA: Diagnosis not present

## 2021-04-14 DIAGNOSIS — E11649 Type 2 diabetes mellitus with hypoglycemia without coma: Secondary | ICD-10-CM | POA: Diagnosis not present

## 2021-04-14 DIAGNOSIS — Z87891 Personal history of nicotine dependence: Secondary | ICD-10-CM | POA: Diagnosis not present

## 2021-04-14 DIAGNOSIS — I10 Essential (primary) hypertension: Secondary | ICD-10-CM | POA: Diagnosis not present

## 2021-04-14 DIAGNOSIS — J45909 Unspecified asthma, uncomplicated: Secondary | ICD-10-CM | POA: Diagnosis not present

## 2021-04-14 DIAGNOSIS — M1990 Unspecified osteoarthritis, unspecified site: Secondary | ICD-10-CM | POA: Diagnosis not present

## 2021-04-14 DIAGNOSIS — N179 Acute kidney failure, unspecified: Secondary | ICD-10-CM | POA: Diagnosis not present

## 2021-04-17 NOTE — Patient Instructions (Signed)

## 2021-04-18 ENCOUNTER — Encounter: Payer: Self-pay | Admitting: Nurse Practitioner

## 2021-04-18 ENCOUNTER — Other Ambulatory Visit: Payer: Self-pay

## 2021-04-18 ENCOUNTER — Ambulatory Visit (INDEPENDENT_AMBULATORY_CARE_PROVIDER_SITE_OTHER): Payer: Medicare Other | Admitting: Nurse Practitioner

## 2021-04-18 VITALS — BP 125/79 | HR 99 | Ht 64.0 in | Wt 187.8 lb

## 2021-04-18 DIAGNOSIS — I10 Essential (primary) hypertension: Secondary | ICD-10-CM

## 2021-04-18 DIAGNOSIS — E1165 Type 2 diabetes mellitus with hyperglycemia: Secondary | ICD-10-CM | POA: Diagnosis not present

## 2021-04-18 DIAGNOSIS — E782 Mixed hyperlipidemia: Secondary | ICD-10-CM | POA: Diagnosis not present

## 2021-04-18 MED ORDER — LEVEMIR FLEXTOUCH 100 UNIT/ML ~~LOC~~ SOPN: 35.0000 [IU] | PEN_INJECTOR | Freq: Every day | SUBCUTANEOUS | 3 refills | Status: DC

## 2021-04-18 NOTE — Progress Notes (Signed)
Endocrinology follow-up note       04/18/2021, 9:56 AM   Subjective:    Patient ID: Kari Miles, female    DOB: [birth year].  Kari Miles is being seen in follow-up for management of chronically uncontrolled type 2 diabetes.  She also has hypertension, hyperlipidemia on treatment.    PMD:  Rosita Fire, MD.   Past Medical History:  Diagnosis Date   Arthritis    Carpal tunnel syndrome    Chronic back pain    Diabetes mellitus without complication ([place])    Hypertension    Tendonitis    Past Surgical History:  Procedure Laterality Date   ABDOMINAL HYSTERECTOMY     CESAREAN SECTION     ORTHOPEDIC SURGERY     Social History   Socioeconomic History   Marital status: Widowed    Spouse name: Not on file   Number of children: Not on file   Years of education: Not on file   Highest education level: Not on file  Occupational History   Not on file  Tobacco Use   Smoking status: Every Day    Packs/day: 0.10    Years: 30.00    Pack years: 3.00    Types: Cigarettes   Smokeless tobacco: Never  Vaping Use   Vaping Use: Never used  Substance and Sexual Activity   Alcohol use: No   Drug use: No   Sexual activity: Not on file  Other Topics Concern   Not on file  Social History Narrative   Not on file   Social Determinants of Health   Financial Resource Strain: Not on file  Food Insecurity: Not on file  Transportation Needs: Not on file  Physical Activity: Not on file  Stress: Not on file  Social Connections: Not on file    Family History  Problem Relation Age of Onset   Seizures Mother    Heart disease Father    Cancer Sister    Heart attack Sister    Colon cancer Neg Hx    Outpatient Encounter Medications as of 04/18/2021  Medication Sig   albuterol (PROVENTIL HFA;VENTOLIN HFA) 108 (90 Base) MCG/ACT inhaler Inhale 2 puffs into the lungs every 6 (six) hours as needed for wheezing or  shortness of breath.   Alcohol Swabs 70 % PADS Use as directed to clean skin prior to injections tid and checking blood glucose tid   amLODipine (NORVASC) 5 MG tablet Take 5 mg by mouth daily.   aspirin 81 MG EC tablet Take by mouth.   aspirin EC 81 MG tablet Take 81 mg by mouth daily.   atorvastatin (LIPITOR) 40 MG tablet Take 1 tablet (40 mg total) by mouth daily.   blood glucose meter kit and supplies KIT Dispense based on patient and insurance preference. Use four times daily as directed.   Blood Glucose Monitoring Suppl (ONETOUCH VERIO REFLECT) w/Device KIT by Does not apply route in the morning, at noon, in the evening, and at bedtime.   butalbital-acetaminophen-caffeine (FIORICET) 50-325-40 MG tablet TAKE 1 OR 2 TABLETS BY MOUTH EVERY 4 HOURS AS NEEDED   cephALEXin (KEFLEX) 500 MG capsule Take 500 mg by mouth 3 (three) times daily.   cloNIDine (  CATAPRES) 0.1 MG tablet Take 0.1 mg by mouth 2 (two) times daily.   cyclobenzaprine (FLEXERIL) 5 MG tablet Take 5 mg by mouth 3 (three) times daily as needed.   diphenhydrAMINE (BENADRYL) 25 MG tablet Take 25 mg by mouth daily as needed for allergies.   diphenhydrAMINE (SOMINEX) 25 MG tablet Take by mouth.   ezetimibe (ZETIA) 10 MG tablet Take 10 mg by mouth daily.   famotidine (PEPCID) 20 MG tablet Take by mouth.   gabapentin (NEURONTIN) 300 MG capsule Take 300 mg by mouth 3 (three) times daily.   glipiZIDE (GLUCOTROL XL) 10 MG 24 hr tablet Take 1 tablet (10 mg total) by mouth daily with breakfast.   glucose blood test strip 1 each by Other route 4 (four) times daily. Use as instructed 4 x daily. E11.65 True Track   hydrochlorothiazide (HYDRODIURIL) 25 MG tablet Take 25 mg by mouth daily.   ibuprofen (ADVIL) 800 MG tablet Take 800 mg by mouth 2 (two) times daily as needed.   Ibuprofen-diphenhydrAMINE Cit 200-38 MG TABS Take 1 tablet by mouth at bedtime as needed (sleep).   insulin aspart (FIASP FLEXTOUCH) 100 UNIT/ML FlexTouch Pen Inject 4-10  Units into the skin 3 (three) times daily with meals.   Lancet Devices (SIMPLE DIAGNOSTICS LANCING DEV) MISC Use 1 device As directed To test your blood sugar   LINZESS 145 MCG CAPS capsule Take 145 mcg by mouth daily.   lisinopril (ZESTRIL) 40 MG tablet Take 40 mg by mouth daily.   metFORMIN (GLUCOPHAGE) 500 MG tablet TAKE ONE TABLET BY MOUTH TWICE DAILY. TAKE WITH A MEAL. (Patient taking differently: Take 500 mg by mouth daily at 12 noon.)   metoCLOPramide (REGLAN) 5 MG tablet Take 5 mg by mouth 3 (three) times daily.   omeprazole (PRILOSEC) 20 MG capsule Take 2 capsules (40 mg total) by mouth daily.   ondansetron (ZOFRAN) 8 MG tablet Take 8 mg by mouth every 6 (six) hours as needed.   ondansetron (ZOFRAN-ODT) 4 MG disintegrating tablet Take 4 mg by mouth every 8 (eight) hours as needed.   Polyethylene Glycol 400 0.25 % SOLN Place 1 drop into both eyes every 6 (six) hours as needed (dry eyes).    polyethylene glycol powder (GLYCOLAX/MIRALAX) 17 GM/SCOOP powder Take 17 g by mouth daily.   spironolactone (ALDACTONE) 25 MG tablet Take 25 mg by mouth 2 (two) times daily.   sucralfate (CARAFATE) 1 GM/10ML suspension Take 10 mLs (1 g total) by mouth 4 (four) times daily -  with meals and at bedtime.   [DISCONTINUED] insulin detemir (LEVEMIR FLEXTOUCH) 100 UNIT/ML FlexPen Inject 20 Units into the skin at bedtime.   insulin detemir (LEVEMIR FLEXTOUCH) 100 UNIT/ML FlexPen Inject 35 Units into the skin at bedtime.   No facility-administered encounter medications on file as of 04/18/2021.    ALLERGIES: No Known Allergies  VACCINATION STATUS: Immunization History  Administered Date(s) Administered   Influenza-Unspecified 08/28/2018   Moderna Sars-Covid-2 Vaccination 09/21/2019, 10/19/2019    Diabetes She presents for her follow-up diabetic visit. She has type 2 diabetes mellitus. Onset time: she was diagnosed at approximate age of 14 years. Her disease course has been worsening. There are no  hypoglycemic associated symptoms. Pertinent negatives for hypoglycemia include no headaches, pallor or seizures. Associated symptoms include foot paresthesias, polydipsia, polyuria and weight loss. Pertinent negatives for diabetes include no chest pain and no polyphagia. There are no hypoglycemic complications. Symptoms are stable. Diabetic complications include nephropathy and peripheral neuropathy. Risk factors for  coronary artery disease include diabetes mellitus, dyslipidemia, family history, hypertension, obesity, sedentary lifestyle, tobacco exposure and post-menopausal. Current diabetic treatment includes intensive insulin program and oral agent (dual therapy). She is compliant with treatment most of the time. Her weight is decreasing steadily. She is following a generally unhealthy (still consumes sodas (has cut back)) diet. When asked about meal planning, she reported none. She has not had a previous visit with a dietitian. She never participates in exercise. Her home blood glucose trend is increasing steadily. Her breakfast blood glucose range is generally >200 mg/dl. Her lunch blood glucose range is generally >200 mg/dl. Her dinner blood glucose range is generally >200 mg/dl. Her bedtime blood glucose range is generally >200 mg/dl. Her overall blood glucose range is >200 mg/dl. (She presents today with her meter and logs showing significantly high glycemic profile both fasting and postprandially.  She was not due for another A1c today.  She is not having any hypoglycemia.  Analysis of her meter shows 7-day average of 322, 14-day average of 312, 30-day average of 277, and 90-day average of 218.  She was taken off her Glipizide during hospitalization for hypoglycemia but has since restarted it. ) An ACE inhibitor/angiotensin II receptor blocker is being taken. She does not see a podiatrist.Eye exam is not current.  Hypertension This is a chronic problem. The current episode started more than 1 year ago.  The problem has been waxing and waning since onset. The problem is controlled. Pertinent negatives include no chest pain, headaches, palpitations or shortness of breath. Risk factors for coronary artery disease include diabetes mellitus, obesity, sedentary lifestyle, smoking/tobacco exposure, family history, dyslipidemia and stress. Past treatments include ACE inhibitors, calcium channel blockers, diuretics and direct vasodilators. The current treatment provides moderate improvement. Compliance problems include psychosocial issues and diet.  Hypertensive end-organ damage includes kidney disease. Identifiable causes of hypertension include chronic renal disease.   Review of systems  Constitutional: + steadily decreasing body weight,  current Body mass index is 32.24 kg/m. , + fatigue, no subjective hyperthermia, no subjective hypothermia Eyes: no blurry vision, no xerophthalmia ENT: no sore throat, no nodules palpated in throat, no dysphagia/odynophagia, no hoarseness Cardiovascular: no chest pain, no shortness of breath, no palpitations, no leg swelling Respiratory: no cough, no shortness of breath Gastrointestinal: no nausea/vomiting/diarrhea Musculoskeletal: no muscle/joint aches, complaining of left leg pain (has history of lower back problems)- walks with cane Skin: no rashes, no hyperemia Neurological: no tremors, no dizziness, + numbness/tingling/cold sensation in bilateral feet. Psychiatric: no depression, no anxiety   Objective:    BP 125/79   Pulse 99   Ht _0  (1.626 m)   Wt 187 lb 12.8 oz (85.2 kg)   BMI 32.24 kg/m   Wt Readings from Last 3 Encounters:  04/18/21 187 lb 12.8 oz (85.2 kg)  04/01/21 195 lb (88.5 kg)  03/19/21 195 lb 12.8 oz (88.8 kg)    BP Readings from Last 3 Encounters:  04/18/21 125/79  04/01/21 113/71  03/19/21 (!) 143/77    Physical Exam- Limited  Constitutional:  Body mass index is 32.24 kg/m. , not in acute distress, normal state of mind Eyes:   EOMI, no exophthalmos Neck: Supple Cardiovascular: RRR, no murmurs, rubs, or gallops, no edema Respiratory: Adequate breathing efforts, no crackles, rales, rhonchi, or wheezing Musculoskeletal: no gross deformities, strength intact in all four extremities, no gross restriction of joint movements, walks with cane Skin:  no rashes, no hyperemia Neurological: no tremor with outstretched hands  CMP ( most recent) CMP     Component Value Date/Time   NA 134 (L) 04/01/2021 1400   NA 135 (A) 11/15/2020 0000   K 3.0 (L) 04/01/2021 1400   CL 92 (L) 04/01/2021 1400   CO2 32 04/01/2021 1400   GLUCOSE 152 (H) 04/01/2021 1400   BUN 33 (H) 04/01/2021 1400   BUN 16 11/15/2020 0000   CREATININE 1.51 (H) 04/01/2021 1400   CREATININE 0.56 09/22/2017 1130   CALCIUM 9.6 04/01/2021 1400   PROT 7.7 04/01/2021 1400   ALBUMIN 4.5 04/01/2021 1400   AST 17 04/01/2021 1400   ALT 22 04/01/2021 1400   ALKPHOS 51 04/01/2021 1400   BILITOT 1.1 04/01/2021 1400   GFRNONAA 39 (L) 04/01/2021 1400   GFRNONAA 104 09/22/2017 1130   GFRAA 64 11/15/2020 0000   GFRAA 120 09/22/2017 1130    Diabetic Labs (most recent): Lab Results  Component Value Date   HGBA1C 9.1 (H) 02/05/2021   HGBA1C 10.7 (A) 08/20/2020   HGBA1C 10.9 (A) 04/30/2020     Lipid Panel     Component Value Date/Time   CHOL 94 12/01/2018 0000   TRIG 68 05/14/2020 0000   HDL 42 05/14/2020 0000   LDLCALC 114 05/14/2020 0000      Assessment & Plan:   1) Uncontrolled type 2 diabetes mellitus with hyperglycemia (Kari Miles)  - Kari Miles has currently uncontrolled symptomatic type 2 DM since  60 years of age.  She presents today with her meter and logs showing significantly high glycemic profile both fasting and postprandially.  She was not due for another A1c today.  She is not having any hypoglycemia.  Analysis of her meter shows 7-day average of 322, 14-day average of 312, 30-day average of 277, and 90-day average of 218.  She was  taken off her Glipizide during hospitalization for hypoglycemia but has since restarted it.  her diabetes is complicated by noncompliance/nonadherence, chronic heavy smoking and Kari Miles remains at a high risk for more acute and chronic complications which include CAD, CVA, CKD, retinopathy, and neuropathy. These are all discussed in detail with the patient.  - Nutritional counseling repeated at each appointment due to patients tendency to fall back in to old habits.  - The patient admits there is a room for improvement in their diet and drink choices. -  Suggestion is made for the patient to avoid simple carbohydrates from their diet including Cakes, Sweet Desserts / Pastries, Ice Cream, Soda (diet and regular), Sweet Tea, Candies, Chips, Cookies, Sweet Pastries, Store Bought Juices, Alcohol in Excess of 1-2 drinks a day, Artificial Sweeteners, Coffee Creamer, and "Sugar-free" Products. This will help patient to have stable blood glucose profile and potentially avoid unintended weight gain.   - I encouraged the patient to switch to unprocessed or minimally processed complex starch and increased protein intake (animal or plant source), fruits, and vegetables.   - Patient is advised to stick to a routine mealtimes to eat 3 meals a day and avoid unnecessary snacks (to snack only to correct hypoglycemia).  - I have approached her with the following individualized plan to manage diabetes and patient agrees:   -Given her current and persisting glycemic burden, she will continue to require intensive treatment with multiple daily injections of insulin and oral therapies in order for her to achieve control of diabetes to target.  -She is advised to increase her Levemir to 35 units SQ nightly and continue her current dose of Fiasp at  4-10 units TID with meals if glucose is above 90 and she is eating (Specific instructions on how to titrate insulin dosage based on glucose readings given to patient in  writing).  She can continue her Glipizide 10 mg XL daily with breakfast and her Metformin 500 mg po daily with breakfast.    -She is encouraged to continue monitoring blood glucose 4 times daily, before meals and before bed, and to call the clinic if she has readings less than 70 or above 300 for 3 tests in a row.  I entered order through [employer] for Dexcom but it was canceled because she did not answer her phone.  I re-entered the order today and told the patient to answer her phone (even if it is an unrecognized number) so they can proceed with fulfillment.   - Patient is warned not to take insulin without proper monitoring per orders. -Adjustment parameters are given for hypo and hyperglycemia in writing.  -- she not a suitable candidate for incretin therapy given her chronic heavy smoking.   - Patient specific target  A1c;  LDL, HDL, Triglycerides, and  Waist Circumference were discussed in detail.  2) BP/HTN:  Her blood pressure is controlled to target.  She is advised to continue Norvasc 5 mg po daily, HCTZ 25 mg po daily, Spironolactone 25 mg po twice daily, Clonidine 0.1 mg po twice daily and Lisinopril 40 mg po daily.    3) Lipids/HPL:  Her most recent lipid panel from 08/19/20 shows uncontrolled LDL of 136 (worsening since last visit).  She is advised to continue her dose of Lipitor to 40 mg po daily at bedtime.  Side effects and precautions discussed with her.  She says she recently had blood work done at Dr. Nicky Pugh office, I requested she have copy faxed to [place] for our records.  4)  Weight/Diet: Her Body mass index is 32.24 kg/m.- clearly complicating her diabetes care.  She is a candidate for modest weight loss.  CDE Consult will be initiated , exercise, and detailed carbohydrates information provided.  She has missed her appointments with Jearld Fenton, RDE due to transportation issues.    5) Chronic Care/Health Maintenance: -she  is on ACEI medications and statin is encouraged to  continue to follow up with Ophthalmology, Dentist,  Podiatrist at least yearly or according to recommendations, and advised to quit smoking. I have recommended yearly flu vaccine and pneumonia vaccination at least every 5 years; moderate intensity exercise for up to 150 minutes weekly; and  sleep for at least 7 hours a day.  -The patient was counseled on the dangers of tobacco use, and was advised to quit.  Reviewed strategies to maximize success, including removing cigarettes and smoking materials from environment.  - I advised patient to maintain close follow up with Rosita Fire, MD for primary care needs.        I spent 44 minutes in the care of the patient today including review of labs from [place], Lipids, Thyroid Function, Hematology (current and previous including abstractions from other facilities); face-to-face time discussing  her blood glucose readings/logs, discussing hypoglycemia and hyperglycemia episodes and symptoms, medications doses, her options of short and long term treatment based on the latest standards of care / guidelines;  discussion about incorporating lifestyle medicine;  and documenting the encounter.    Please refer to Patient Instructions for Blood Glucose Monitoring and Insulin/Medications Dosing Guide"  in media tab for additional information. Please  also refer to " Patient Self  Inventory" in the Media  tab for reviewed elements of pertinent patient history.  Kari Miles participated in the discussions, expressed understanding, and voiced agreement with the above plans.  All questions were answered to her satisfaction. she is encouraged to contact clinic should she have any questions or concerns prior to her return visit.    Follow up plan: Return in about 3 months (around 07/19/2021) for Diabetes F/U with A1c in office, No previsit labs, Bring meter and logs.   Rayetta Pigg, [facility] [address] [place],  [place] [address] Phone: [phone] Fax: [phone]  04/18/2021, 9:56 AM

## 2021-04-22 DIAGNOSIS — M1990 Unspecified osteoarthritis, unspecified site: Secondary | ICD-10-CM | POA: Diagnosis not present

## 2021-04-22 DIAGNOSIS — I10 Essential (primary) hypertension: Secondary | ICD-10-CM | POA: Diagnosis not present

## 2021-04-22 DIAGNOSIS — N179 Acute kidney failure, unspecified: Secondary | ICD-10-CM | POA: Diagnosis not present

## 2021-04-22 DIAGNOSIS — E11649 Type 2 diabetes mellitus with hypoglycemia without coma: Secondary | ICD-10-CM | POA: Diagnosis not present

## 2021-04-22 DIAGNOSIS — J45909 Unspecified asthma, uncomplicated: Secondary | ICD-10-CM | POA: Diagnosis not present

## 2021-04-22 DIAGNOSIS — Z87891 Personal history of nicotine dependence: Secondary | ICD-10-CM | POA: Diagnosis not present

## 2021-04-23 DIAGNOSIS — E1140 Type 2 diabetes mellitus with diabetic neuropathy, unspecified: Secondary | ICD-10-CM | POA: Diagnosis not present

## 2021-04-23 DIAGNOSIS — I10 Essential (primary) hypertension: Secondary | ICD-10-CM | POA: Diagnosis not present

## 2021-04-25 DIAGNOSIS — I10 Essential (primary) hypertension: Secondary | ICD-10-CM | POA: Diagnosis not present

## 2021-04-25 DIAGNOSIS — I517 Cardiomegaly: Secondary | ICD-10-CM | POA: Diagnosis not present

## 2021-04-25 DIAGNOSIS — R9431 Abnormal electrocardiogram [ECG] [EKG]: Secondary | ICD-10-CM | POA: Diagnosis not present

## 2021-04-25 DIAGNOSIS — I351 Nonrheumatic aortic (valve) insufficiency: Secondary | ICD-10-CM | POA: Diagnosis not present

## 2021-04-25 DIAGNOSIS — R0789 Other chest pain: Secondary | ICD-10-CM | POA: Diagnosis not present

## 2021-04-26 ENCOUNTER — Other Ambulatory Visit: Payer: Self-pay | Admitting: Nurse Practitioner

## 2021-04-28 DIAGNOSIS — E119 Type 2 diabetes mellitus without complications: Secondary | ICD-10-CM | POA: Diagnosis not present

## 2021-04-30 DIAGNOSIS — E11649 Type 2 diabetes mellitus with hypoglycemia without coma: Secondary | ICD-10-CM | POA: Diagnosis not present

## 2021-04-30 DIAGNOSIS — Z87891 Personal history of nicotine dependence: Secondary | ICD-10-CM | POA: Diagnosis not present

## 2021-04-30 DIAGNOSIS — N179 Acute kidney failure, unspecified: Secondary | ICD-10-CM | POA: Diagnosis not present

## 2021-04-30 DIAGNOSIS — I10 Essential (primary) hypertension: Secondary | ICD-10-CM | POA: Diagnosis not present

## 2021-04-30 DIAGNOSIS — M1990 Unspecified osteoarthritis, unspecified site: Secondary | ICD-10-CM | POA: Diagnosis not present

## 2021-04-30 DIAGNOSIS — J45909 Unspecified asthma, uncomplicated: Secondary | ICD-10-CM | POA: Diagnosis not present

## 2021-05-02 DIAGNOSIS — I10 Essential (primary) hypertension: Secondary | ICD-10-CM | POA: Diagnosis not present

## 2021-05-02 DIAGNOSIS — J45909 Unspecified asthma, uncomplicated: Secondary | ICD-10-CM | POA: Diagnosis not present

## 2021-05-02 DIAGNOSIS — M1990 Unspecified osteoarthritis, unspecified site: Secondary | ICD-10-CM | POA: Diagnosis not present

## 2021-05-02 DIAGNOSIS — N179 Acute kidney failure, unspecified: Secondary | ICD-10-CM | POA: Diagnosis not present

## 2021-05-02 DIAGNOSIS — E11649 Type 2 diabetes mellitus with hypoglycemia without coma: Secondary | ICD-10-CM | POA: Diagnosis not present

## 2021-05-02 DIAGNOSIS — Z87891 Personal history of nicotine dependence: Secondary | ICD-10-CM | POA: Diagnosis not present

## 2021-05-04 DIAGNOSIS — I10 Essential (primary) hypertension: Secondary | ICD-10-CM | POA: Diagnosis not present

## 2021-05-04 DIAGNOSIS — J45909 Unspecified asthma, uncomplicated: Secondary | ICD-10-CM | POA: Diagnosis not present

## 2021-05-04 DIAGNOSIS — M1990 Unspecified osteoarthritis, unspecified site: Secondary | ICD-10-CM | POA: Diagnosis not present

## 2021-05-04 DIAGNOSIS — N179 Acute kidney failure, unspecified: Secondary | ICD-10-CM | POA: Diagnosis not present

## 2021-05-04 DIAGNOSIS — Z87891 Personal history of nicotine dependence: Secondary | ICD-10-CM | POA: Diagnosis not present

## 2021-05-04 DIAGNOSIS — E11649 Type 2 diabetes mellitus with hypoglycemia without coma: Secondary | ICD-10-CM | POA: Diagnosis not present

## 2021-05-05 DIAGNOSIS — N179 Acute kidney failure, unspecified: Secondary | ICD-10-CM | POA: Diagnosis not present

## 2021-05-05 DIAGNOSIS — I10 Essential (primary) hypertension: Secondary | ICD-10-CM | POA: Diagnosis not present

## 2021-05-05 DIAGNOSIS — M1990 Unspecified osteoarthritis, unspecified site: Secondary | ICD-10-CM | POA: Diagnosis not present

## 2021-05-05 DIAGNOSIS — E11649 Type 2 diabetes mellitus with hypoglycemia without coma: Secondary | ICD-10-CM | POA: Diagnosis not present

## 2021-05-05 DIAGNOSIS — Z87891 Personal history of nicotine dependence: Secondary | ICD-10-CM | POA: Diagnosis not present

## 2021-05-05 DIAGNOSIS — J45909 Unspecified asthma, uncomplicated: Secondary | ICD-10-CM | POA: Diagnosis not present

## 2021-05-09 DIAGNOSIS — Z87891 Personal history of nicotine dependence: Secondary | ICD-10-CM | POA: Diagnosis not present

## 2021-05-09 DIAGNOSIS — Z794 Long term (current) use of insulin: Secondary | ICD-10-CM | POA: Diagnosis not present

## 2021-05-09 DIAGNOSIS — Z9181 History of falling: Secondary | ICD-10-CM | POA: Diagnosis not present

## 2021-05-09 DIAGNOSIS — M1990 Unspecified osteoarthritis, unspecified site: Secondary | ICD-10-CM | POA: Diagnosis not present

## 2021-05-09 DIAGNOSIS — J45909 Unspecified asthma, uncomplicated: Secondary | ICD-10-CM | POA: Diagnosis not present

## 2021-05-09 DIAGNOSIS — I10 Essential (primary) hypertension: Secondary | ICD-10-CM | POA: Diagnosis not present

## 2021-05-09 DIAGNOSIS — E11649 Type 2 diabetes mellitus with hypoglycemia without coma: Secondary | ICD-10-CM | POA: Diagnosis not present

## 2021-05-09 DIAGNOSIS — Z90710 Acquired absence of both cervix and uterus: Secondary | ICD-10-CM | POA: Diagnosis not present

## 2021-05-09 DIAGNOSIS — Z90722 Acquired absence of ovaries, bilateral: Secondary | ICD-10-CM | POA: Diagnosis not present

## 2021-05-09 DIAGNOSIS — Z7984 Long term (current) use of oral hypoglycemic drugs: Secondary | ICD-10-CM | POA: Diagnosis not present

## 2021-05-13 DIAGNOSIS — E11649 Type 2 diabetes mellitus with hypoglycemia without coma: Secondary | ICD-10-CM | POA: Diagnosis not present

## 2021-05-13 DIAGNOSIS — M1990 Unspecified osteoarthritis, unspecified site: Secondary | ICD-10-CM | POA: Diagnosis not present

## 2021-05-13 DIAGNOSIS — Z87891 Personal history of nicotine dependence: Secondary | ICD-10-CM | POA: Diagnosis not present

## 2021-05-13 DIAGNOSIS — Z794 Long term (current) use of insulin: Secondary | ICD-10-CM | POA: Diagnosis not present

## 2021-05-13 DIAGNOSIS — I10 Essential (primary) hypertension: Secondary | ICD-10-CM | POA: Diagnosis not present

## 2021-05-13 DIAGNOSIS — J45909 Unspecified asthma, uncomplicated: Secondary | ICD-10-CM | POA: Diagnosis not present

## 2021-05-15 ENCOUNTER — Telehealth: Payer: Self-pay | Admitting: Nurse Practitioner

## 2021-05-15 NOTE — Telephone Encounter (Signed)
Have her increase her Levemir to 45 units.  Remind her to eat 3 well balanced meals and avoid snacking in between meals.  Avoid sugary beverages and continue to move regularly, too!

## 2021-05-15 NOTE — Telephone Encounter (Signed)
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 CB# [phone]

## 2021-05-15 NOTE — Telephone Encounter (Signed)
Pt notified and voices understanding. 

## 2021-05-15 NOTE — Telephone Encounter (Signed)
Kari Miles called to let us know the pt has had high BG readings. She was not with the pt at the time so pt was called and gave the readings below.    Date Before breakfast Before lunch Before supper Bedtime  11/15 [phone]   11/16 206 276 241   11/16 190 268    11/17 279       Pt taking: Levemir 35units qhs, Fiasp 4-10 units tidac, Glipizide XL 10 mg qam, Metformin 500mg  bid. She states she is not taking any medication such as prednisone and is taking meds as prescribed. She has a Dexcom but has been using her Verio until someone comes to help her change the sensor today.

## 2021-05-19 DIAGNOSIS — J45909 Unspecified asthma, uncomplicated: Secondary | ICD-10-CM | POA: Diagnosis not present

## 2021-05-19 DIAGNOSIS — I10 Essential (primary) hypertension: Secondary | ICD-10-CM | POA: Diagnosis not present

## 2021-05-19 DIAGNOSIS — E11649 Type 2 diabetes mellitus with hypoglycemia without coma: Secondary | ICD-10-CM | POA: Diagnosis not present

## 2021-05-19 DIAGNOSIS — Z87891 Personal history of nicotine dependence: Secondary | ICD-10-CM | POA: Diagnosis not present

## 2021-05-19 DIAGNOSIS — M1990 Unspecified osteoarthritis, unspecified site: Secondary | ICD-10-CM | POA: Diagnosis not present

## 2021-05-19 DIAGNOSIS — Z794 Long term (current) use of insulin: Secondary | ICD-10-CM | POA: Diagnosis not present

## 2021-05-19 NOTE — Telephone Encounter (Signed)
We need to have her increase her Levemir to 55 units.  Something isn't adding up.  Can we schedule a follow up for her next week sometime?

## 2021-05-19 NOTE — Telephone Encounter (Signed)
Kari Miles did not answer the phone and there was not a VM. I called and spoke with [name] but she said the patient hardly answers the phones that most the time she just shows up over there. I asked her to please have the patient increased to 55 units and we need to see her in the office next week. She said if she does not reach , that she would go by there [place]

## 2021-05-19 NOTE — Telephone Encounter (Signed)
Sophie, RN home health with [facility] called and said her sugar yesterday afternoon was 382, at bedtime last night it was 339. This morning it was 332 and at lunch today it was 375. Please Advise [phone]

## 2021-05-21 DIAGNOSIS — Z794 Long term (current) use of insulin: Secondary | ICD-10-CM | POA: Diagnosis not present

## 2021-05-21 DIAGNOSIS — I10 Essential (primary) hypertension: Secondary | ICD-10-CM | POA: Diagnosis not present

## 2021-05-21 DIAGNOSIS — Z87891 Personal history of nicotine dependence: Secondary | ICD-10-CM | POA: Diagnosis not present

## 2021-05-21 DIAGNOSIS — E11649 Type 2 diabetes mellitus with hypoglycemia without coma: Secondary | ICD-10-CM | POA: Diagnosis not present

## 2021-05-21 DIAGNOSIS — M1990 Unspecified osteoarthritis, unspecified site: Secondary | ICD-10-CM | POA: Diagnosis not present

## 2021-05-21 DIAGNOSIS — J45909 Unspecified asthma, uncomplicated: Secondary | ICD-10-CM | POA: Diagnosis not present

## 2021-05-24 DIAGNOSIS — I10 Essential (primary) hypertension: Secondary | ICD-10-CM | POA: Diagnosis not present

## 2021-05-24 DIAGNOSIS — E1140 Type 2 diabetes mellitus with diabetic neuropathy, unspecified: Secondary | ICD-10-CM | POA: Diagnosis not present

## 2021-05-27 DIAGNOSIS — M1990 Unspecified osteoarthritis, unspecified site: Secondary | ICD-10-CM | POA: Diagnosis not present

## 2021-05-27 DIAGNOSIS — Z87891 Personal history of nicotine dependence: Secondary | ICD-10-CM | POA: Diagnosis not present

## 2021-05-27 DIAGNOSIS — Z794 Long term (current) use of insulin: Secondary | ICD-10-CM | POA: Diagnosis not present

## 2021-05-27 DIAGNOSIS — E11649 Type 2 diabetes mellitus with hypoglycemia without coma: Secondary | ICD-10-CM | POA: Diagnosis not present

## 2021-05-27 DIAGNOSIS — I10 Essential (primary) hypertension: Secondary | ICD-10-CM | POA: Diagnosis not present

## 2021-05-27 DIAGNOSIS — J45909 Unspecified asthma, uncomplicated: Secondary | ICD-10-CM | POA: Diagnosis not present

## 2021-05-30 ENCOUNTER — Other Ambulatory Visit: Payer: Self-pay | Admitting: Nurse Practitioner

## 2021-06-08 DIAGNOSIS — M1990 Unspecified osteoarthritis, unspecified site: Secondary | ICD-10-CM | POA: Diagnosis not present

## 2021-06-08 DIAGNOSIS — J45909 Unspecified asthma, uncomplicated: Secondary | ICD-10-CM | POA: Diagnosis not present

## 2021-06-08 DIAGNOSIS — Z90710 Acquired absence of both cervix and uterus: Secondary | ICD-10-CM | POA: Diagnosis not present

## 2021-06-08 DIAGNOSIS — E11649 Type 2 diabetes mellitus with hypoglycemia without coma: Secondary | ICD-10-CM | POA: Diagnosis not present

## 2021-06-08 DIAGNOSIS — I10 Essential (primary) hypertension: Secondary | ICD-10-CM | POA: Diagnosis not present

## 2021-06-08 DIAGNOSIS — Z90722 Acquired absence of ovaries, bilateral: Secondary | ICD-10-CM | POA: Diagnosis not present

## 2021-06-08 DIAGNOSIS — Z87891 Personal history of nicotine dependence: Secondary | ICD-10-CM | POA: Diagnosis not present

## 2021-06-08 DIAGNOSIS — Z794 Long term (current) use of insulin: Secondary | ICD-10-CM | POA: Diagnosis not present

## 2021-06-08 DIAGNOSIS — Z7984 Long term (current) use of oral hypoglycemic drugs: Secondary | ICD-10-CM | POA: Diagnosis not present

## 2021-06-08 DIAGNOSIS — Z9181 History of falling: Secondary | ICD-10-CM | POA: Diagnosis not present

## 2021-06-25 DIAGNOSIS — Z1389 Encounter for screening for other disorder: Secondary | ICD-10-CM | POA: Diagnosis not present

## 2021-06-25 DIAGNOSIS — J410 Simple chronic bronchitis: Secondary | ICD-10-CM | POA: Diagnosis not present

## 2021-06-25 DIAGNOSIS — Z1331 Encounter for screening for depression: Secondary | ICD-10-CM | POA: Diagnosis not present

## 2021-06-25 DIAGNOSIS — E1165 Type 2 diabetes mellitus with hyperglycemia: Secondary | ICD-10-CM | POA: Diagnosis not present

## 2021-06-25 DIAGNOSIS — I10 Essential (primary) hypertension: Secondary | ICD-10-CM | POA: Diagnosis not present

## 2021-06-25 DIAGNOSIS — F17210 Nicotine dependence, cigarettes, uncomplicated: Secondary | ICD-10-CM | POA: Diagnosis not present

## 2021-06-25 DIAGNOSIS — K219 Gastro-esophageal reflux disease without esophagitis: Secondary | ICD-10-CM | POA: Diagnosis not present

## 2021-07-18 DIAGNOSIS — E1349 Other specified diabetes mellitus with other diabetic neurological complication: Secondary | ICD-10-CM | POA: Diagnosis not present

## 2021-07-18 DIAGNOSIS — Z0001 Encounter for general adult medical examination with abnormal findings: Secondary | ICD-10-CM | POA: Diagnosis not present

## 2021-07-18 DIAGNOSIS — I10 Essential (primary) hypertension: Secondary | ICD-10-CM | POA: Diagnosis not present

## 2021-07-22 ENCOUNTER — Ambulatory Visit: Payer: Medicare Other | Admitting: Nurse Practitioner

## 2021-07-24 ENCOUNTER — Ambulatory Visit: Payer: Medicare Other | Admitting: Nurse Practitioner

## 2021-07-26 DIAGNOSIS — E1140 Type 2 diabetes mellitus with diabetic neuropathy, unspecified: Secondary | ICD-10-CM | POA: Diagnosis not present

## 2021-07-26 DIAGNOSIS — I10 Essential (primary) hypertension: Secondary | ICD-10-CM | POA: Diagnosis not present

## 2021-07-28 ENCOUNTER — Ambulatory Visit (INDEPENDENT_AMBULATORY_CARE_PROVIDER_SITE_OTHER): Payer: Medicare Other | Admitting: Nurse Practitioner

## 2021-07-28 ENCOUNTER — Encounter: Payer: Self-pay | Admitting: Nurse Practitioner

## 2021-07-28 ENCOUNTER — Other Ambulatory Visit: Payer: Self-pay

## 2021-07-28 VITALS — BP 112/73 | HR 81 | Ht 64.0 in | Wt 180.0 lb

## 2021-07-28 DIAGNOSIS — I10 Essential (primary) hypertension: Secondary | ICD-10-CM

## 2021-07-28 DIAGNOSIS — E1165 Type 2 diabetes mellitus with hyperglycemia: Secondary | ICD-10-CM | POA: Diagnosis not present

## 2021-07-28 DIAGNOSIS — E782 Mixed hyperlipidemia: Secondary | ICD-10-CM | POA: Diagnosis not present

## 2021-07-28 LAB — POCT UA - MICROALBUMIN
Albumin/Creatinine Ratio, Urine, POC: <30
Creatinine, POC: 200 mg/dL
Microalbumin Ur, POC: 10 mg/L

## 2021-07-28 LAB — POCT GLYCOSYLATED HEMOGLOBIN (HGB A1C): HbA1c, POC (controlled diabetic range): 10.1 % — AB (ref 0.0–7.0)

## 2021-07-28 MED ORDER — LEVEMIR FLEXTOUCH 100 UNIT/ML ~~LOC~~ SOPN: 60.0000 [IU] | PEN_INJECTOR | Freq: Every day | SUBCUTANEOUS | 3 refills | Status: DC

## 2021-07-28 MED ORDER — FIASP FLEXTOUCH 100 UNIT/ML ~~LOC~~ SOPN: 7.0000 [IU] | PEN_INJECTOR | Freq: Three times a day (TID) | SUBCUTANEOUS | 3 refills | Status: DC

## 2021-07-28 NOTE — Patient Instructions (Signed)

## 2021-07-28 NOTE — Progress Notes (Signed)
Endocrinology follow-up note       07/28/2021, 10:53 AM   Subjective:    Patient ID: Kari Miles, female    DOB: 18-Mar-1961.  Kari Miles is being seen in follow-up for management of chronically uncontrolled type 2 diabetes.  She also has hypertension, hyperlipidemia on treatment.    PMD:  Carrolyn Meiers, MD.   Past Medical History:  Diagnosis Date   Arthritis    Carpal tunnel syndrome    Chronic back pain    Diabetes mellitus without complication ([place])    Hypertension    Tendonitis    Past Surgical History:  Procedure Laterality Date   ABDOMINAL HYSTERECTOMY     CESAREAN SECTION     ORTHOPEDIC SURGERY     Social History   Socioeconomic History   Marital status: Widowed    Spouse name: Not on file   Number of children: Not on file   Years of education: Not on file   Highest education level: Not on file  Occupational History   Not on file  Tobacco Use   Smoking status: Every Day    Packs/day: 0.10    Years: 30.00    Pack years: 3.00    Types: Cigarettes   Smokeless tobacco: Never  Vaping Use   Vaping Use: Never used  Substance and Sexual Activity   Alcohol use: No   Drug use: No   Sexual activity: Not on file  Other Topics Concern   Not on file  Social History Narrative   Not on file   Social Determinants of Health   Financial Resource Strain: Not on file  Food Insecurity: Not on file  Transportation Needs: Not on file  Physical Activity: Not on file  Stress: Not on file  Social Connections: Not on file    Family History  Problem Relation Age of Onset   Seizures Mother    Heart disease Father    Cancer Sister    Heart attack Sister    Colon cancer Neg Hx    Outpatient Encounter Medications as of 07/28/2021  Medication Sig   albuterol (PROVENTIL HFA;VENTOLIN HFA) 108 (90 Base) MCG/ACT inhaler Inhale 2 puffs into the lungs every 6 (six) hours as needed for wheezing  or shortness of breath.   Alcohol Swabs 70 % PADS Use as directed to clean skin prior to injections tid and checking blood glucose tid   amLODipine (NORVASC) 5 MG tablet Take 5 mg by mouth daily.   aspirin 81 MG EC tablet Take by mouth.   aspirin EC 81 MG tablet Take 81 mg by mouth daily.   atorvastatin (LIPITOR) 40 MG tablet TAKE ONE TABLET BY MOUTH ONCE DAILY   blood glucose meter kit and supplies KIT Dispense based on patient and insurance preference. Use four times daily as directed.   Blood Glucose Monitoring Suppl (ONETOUCH VERIO REFLECT) w/Device KIT by Does not apply route in the morning, at noon, in the evening, and at bedtime.   butalbital-acetaminophen-caffeine (FIORICET) 50-325-40 MG tablet TAKE 1 OR 2 TABLETS BY MOUTH EVERY 4 HOURS AS NEEDED   cephALEXin (KEFLEX) 500 MG capsule Take 500 mg by mouth 3 (three) times daily.   cloNIDine (CATAPRES)  0.1 MG tablet Take 0.1 mg by mouth 2 (two) times daily.   cyclobenzaprine (FLEXERIL) 5 MG tablet Take 5 mg by mouth 3 (three) times daily as needed.   diphenhydrAMINE (BENADRYL) 25 MG tablet Take 25 mg by mouth daily as needed for allergies.   diphenhydrAMINE (SOMINEX) 25 MG tablet Take by mouth.   ezetimibe (ZETIA) 10 MG tablet Take 10 mg by mouth daily.   famotidine (PEPCID) 20 MG tablet Take by mouth.   gabapentin (NEURONTIN) 300 MG capsule Take 300 mg by mouth 3 (three) times daily.   glipiZIDE (GLUCOTROL XL) 10 MG 24 hr tablet Take 1 tablet (10 mg total) by mouth daily with breakfast.   glucose blood test strip 1 each by Other route 4 (four) times daily. Use as instructed 4 x daily. E11.65 True Track   hydrochlorothiazide (HYDRODIURIL) 25 MG tablet Take 25 mg by mouth daily.   ibuprofen (ADVIL) 800 MG tablet Take 800 mg by mouth 2 (two) times daily as needed.   Ibuprofen-diphenhydrAMINE Cit 200-38 MG TABS Take 1 tablet by mouth at bedtime as needed (sleep).   Lancet Devices (SIMPLE DIAGNOSTICS LANCING DEV) MISC Use 1 device As directed  To test your blood sugar   LINZESS 145 MCG CAPS capsule Take 145 mcg by mouth daily.   lisinopril (ZESTRIL) 40 MG tablet Take 40 mg by mouth daily.   metFORMIN (GLUCOPHAGE) 500 MG tablet TAKE ONE TABLET BY MOUTH TWICE DAILY. TAKE WITH A MEAL. (Patient taking differently: Take 500 mg by mouth daily at 12 noon.)   metoCLOPramide (REGLAN) 5 MG tablet Take 5 mg by mouth 3 (three) times daily.   ondansetron (ZOFRAN) 8 MG tablet Take 8 mg by mouth every 6 (six) hours as needed.   ondansetron (ZOFRAN-ODT) 4 MG disintegrating tablet Take 4 mg by mouth every 8 (eight) hours as needed.   polyethylene glycol (MIRALAX / GLYCOLAX) 17 g packet 1 packet DAILY (route: oral)   Polyethylene Glycol 400 0.25 % SOLN Place 1 drop into both eyes every 6 (six) hours as needed (dry eyes).    polyethylene glycol powder (GLYCOLAX/MIRALAX) 17 GM/SCOOP powder Take 17 g by mouth daily.   spironolactone (ALDACTONE) 25 MG tablet Take 25 mg by mouth 2 (two) times daily.   sucralfate (CARAFATE) 1 GM/10ML suspension Take 10 mLs (1 g total) by mouth 4 (four) times daily -  with meals and at bedtime.   ULTICARE SHORT PEN NEEDLES 31G X 8 MM MISC USE FOUR TIMES DAILY   [DISCONTINUED] insulin aspart (FIASP FLEXTOUCH) 100 UNIT/ML FlexTouch Pen Inject 4-10 Units into the skin 3 (three) times daily with meals.   [DISCONTINUED] insulin detemir (LEVEMIR FLEXTOUCH) 100 UNIT/ML FlexPen Inject 35 Units into the skin at bedtime.   insulin aspart (FIASP FLEXTOUCH) 100 UNIT/ML FlexTouch Pen Inject 7-13 Units into the skin 3 (three) times daily with meals.   insulin detemir (LEVEMIR FLEXTOUCH) 100 UNIT/ML FlexPen Inject 60 Units into the skin at bedtime.   omeprazole (PRILOSEC) 20 MG capsule Take 2 capsules (40 mg total) by mouth daily.   No facility-administered encounter medications on file as of 07/28/2021.    ALLERGIES: No Known Allergies  VACCINATION STATUS: Immunization History  Administered Date(s) Administered    Influenza-Unspecified 08/28/2018   Moderna Sars-Covid-2 Vaccination 09/21/2019, 10/19/2019    Diabetes She presents for her follow-up diabetic visit. She has type 2 diabetes mellitus. Onset time: she was diagnosed at approximate age of 9 years. Her disease course has been worsening. There are no  hypoglycemic associated symptoms. Pertinent negatives for hypoglycemia include no headaches, pallor or seizures. Associated symptoms include foot paresthesias, polydipsia, polyuria and weight loss. Pertinent negatives for diabetes include no chest pain and no polyphagia. There are no hypoglycemic complications. Symptoms are stable. Diabetic complications include nephropathy and peripheral neuropathy. Risk factors for coronary artery disease include diabetes mellitus, dyslipidemia, family history, hypertension, obesity, sedentary lifestyle, tobacco exposure and post-menopausal. Current diabetic treatment includes intensive insulin program and oral agent (dual therapy). She is compliant with treatment most of the time. Her weight is decreasing steadily. She is following a generally unhealthy (still consumes sodas (has cut back)) diet. When asked about meal planning, she reported none. She has not had a previous visit with a dietitian. She never participates in exercise. Her home blood glucose trend is fluctuating minimally. Her breakfast blood glucose range is generally >200 mg/dl. Her lunch blood glucose range is generally >200 mg/dl. Her dinner blood glucose range is generally >200 mg/dl. Her bedtime blood glucose range is generally >200 mg/dl. Her overall blood glucose range is >200 mg/dl. (She presents today with her meter and logs showing above target fasting and postprandial glycemic profile.  Her POCT A1c today is 10.1%.  Analysis of her meter shows 7-day average of 259, 14-day average is 273, 30-day average is 264, 90-day average is 252.  She denies any hypoglycemia.  She is asking about CGM today, one had been  previously ordered but she did not answer her phone to confirm and thus it was cancelled.  ) An ACE inhibitor/angiotensin II receptor blocker is being taken. She does not see a podiatrist.Eye exam is not current.  Hypertension This is a chronic problem. The current episode started more than 1 year ago. The problem has been waxing and waning since onset. The problem is controlled. Pertinent negatives include no chest pain, headaches, palpitations or shortness of breath. Risk factors for coronary artery disease include diabetes mellitus, obesity, sedentary lifestyle, smoking/tobacco exposure, family history, dyslipidemia and stress. Past treatments include ACE inhibitors, calcium channel blockers, diuretics and direct vasodilators. The current treatment provides moderate improvement. Compliance problems include psychosocial issues and diet.  Hypertensive end-organ damage includes kidney disease. Identifiable causes of hypertension include chronic renal disease.   Review of systems  Constitutional: + steadily decreasing body weight,  current Body mass index is 30.9 kg/m. , + fatigue, no subjective hyperthermia, no subjective hypothermia Eyes: no blurry vision, no xerophthalmia ENT: no sore throat, no nodules palpated in throat, no dysphagia/odynophagia, no hoarseness Cardiovascular: no chest pain, no shortness of breath, no palpitations, no leg swelling Respiratory: no cough, no shortness of breath Gastrointestinal: no nausea/vomiting/diarrhea Musculoskeletal: no muscle/joint aches, complaining of left leg pain (has history of lower back problems)- walks with cane Skin: no rashes, no hyperemia Neurological: no tremors, no dizziness, + numbness/tingling/cold sensation in bilateral feet. Psychiatric: no depression, no anxiety   Objective:    BP 112/73    Pulse 81    Ht '5\' 4"'  (1.626 m)    Wt 180 lb (81.6 kg)    SpO2 100%    BMI 30.90 kg/m   Wt Readings from Last 3 Encounters:  07/28/21 180 lb  (81.6 kg)  04/18/21 187 lb 12.8 oz (85.2 kg)  04/01/21 195 lb (88.5 kg)    BP Readings from Last 3 Encounters:  07/28/21 112/73  04/18/21 125/79  04/01/21 113/71    Physical Exam- Limited  Constitutional:  Body mass index is 30.9 kg/m. , not in acute distress, normal state  of mind Eyes:  EOMI, no exophthalmos Neck: Supple Cardiovascular: RRR, no murmurs, rubs, or gallops, no edema Respiratory: Adequate breathing efforts, no crackles, rales, rhonchi, or wheezing Musculoskeletal: no gross deformities, strength intact in all four extremities, no gross restriction of joint movements, walks with cane Skin:  no rashes, no hyperemia Neurological: no tremor with outstretched hands    CMP ( most recent) CMP     Component Value Date/Time   NA 134 (L) 04/01/2021 1400   NA 135 (A) 11/15/2020 0000   K 3.0 (L) 04/01/2021 1400   CL 92 (L) 04/01/2021 1400   CO2 32 04/01/2021 1400   GLUCOSE 152 (H) 04/01/2021 1400   BUN 33 (H) 04/01/2021 1400   BUN 16 11/15/2020 0000   CREATININE 1.51 (H) 04/01/2021 1400   CREATININE 0.56 09/22/2017 1130   CALCIUM 9.6 04/01/2021 1400   PROT 7.7 04/01/2021 1400   ALBUMIN 4.5 04/01/2021 1400   AST 17 04/01/2021 1400   ALT 22 04/01/2021 1400   ALKPHOS 51 04/01/2021 1400   BILITOT 1.1 04/01/2021 1400   GFRNONAA 39 (L) 04/01/2021 1400   GFRNONAA 104 09/22/2017 1130   GFRAA 64 11/15/2020 0000   GFRAA 120 09/22/2017 1130    Diabetic Labs (most recent): Lab Results  Component Value Date   HGBA1C 10.1 (A) 07/28/2021   HGBA1C 9.1 (H) 02/05/2021   HGBA1C 10.7 (A) 08/20/2020     Lipid Panel     Component Value Date/Time   CHOL 94 12/01/2018 0000   TRIG 68 05/14/2020 0000   HDL 42 05/14/2020 0000   LDLCALC 114 05/14/2020 0000      Assessment & Plan:   1) Uncontrolled type 2 diabetes mellitus with hyperglycemia (HCC)  - Kari Miles has currently uncontrolled symptomatic type 2 DM since  61 years of age.  She presents today with her  meter and logs showing above target fasting and postprandial glycemic profile.  Her POCT A1c today is 10.1%.  Analysis of her meter shows 7-day average of 259, 14-day average is 273, 30-day average is 264, 90-day average is 252.  She denies any hypoglycemia.  She is asking about CGM today, one had been previously ordered but she did not answer her phone to confirm and thus it was cancelled.   her diabetes is complicated by noncompliance/nonadherence, chronic heavy smoking and Kari Miles remains at a high risk for more acute and chronic complications which include CAD, CVA, CKD, retinopathy, and neuropathy. These are all discussed in detail with the patient.  - Nutritional counseling repeated at each appointment due to patients tendency to fall back in to old habits.  - The patient admits there is a room for improvement in their diet and drink choices. -  Suggestion is made for the patient to avoid simple carbohydrates from their diet including Cakes, Sweet Desserts / Pastries, Ice Cream, Soda (diet and regular), Sweet Tea, Candies, Chips, Cookies, Sweet Pastries, Store Bought Juices, Alcohol in Excess of 1-2 drinks a day, Artificial Sweeteners, Coffee Creamer, and "Sugar-free" Products. This will help patient to have stable blood glucose profile and potentially avoid unintended weight gain.   - I encouraged the patient to switch to unprocessed or minimally processed complex starch and increased protein intake (animal or plant source), fruits, and vegetables.   - Patient is advised to stick to a routine mealtimes to eat 3 meals a day and avoid unnecessary snacks (to snack only to correct hypoglycemia).  - I have approached her with  the following individualized plan to manage diabetes and patient agrees:   -Given her current and persisting glycemic burden, she will continue to require intensive treatment with multiple daily injections of insulin and oral therapies in order for her to achieve control  of diabetes to target.  -She is advised to increase her Levemir to 60 units SQ nightly and increase her dose of Fiasp to 7-13 units TID with meals if glucose is above 90 and she is eating (Specific instructions on how to titrate insulin dosage based on glucose readings given to patient in writing).  She can continue her Glipizide 10 mg XL daily with breakfast and her Metformin 500 mg po daily with breakfast.    -She is encouraged to continue monitoring blood glucose 4 times daily, before meals and before bed, and to call the clinic if she has readings less than 70 or above 300 for 3 tests in a row.  I entered order through [employer] for Dexcom but it was canceled because she did not answer her phone.  I re-entered the order today and told the patient to answer her phone (even if it is an unrecognized number) so they can proceed with fulfillment.   - Patient is warned not to take insulin without proper monitoring per orders. -Adjustment parameters are given for hypo and hyperglycemia in writing.  -- she not a suitable candidate for incretin therapy given her chronic heavy smoking.    - Patient specific target  A1c;  LDL, HDL, Triglycerides, and  Waist Circumference were discussed in detail.  2) BP/HTN:  Her blood pressure is controlled to target.  She is advised to continue Norvasc 5 mg po daily, HCTZ 25 mg po daily, Spironolactone 25 mg po twice daily, Clonidine 0.1 mg po twice daily and Lisinopril 40 mg po daily.    3) Lipids/HPL:  Her most recent lipid panel from 08/19/20 shows uncontrolled LDL of 136 (worsening since last visit).  She is advised to continue her dose of Lipitor to 40 mg po daily at bedtime.  Side effects and precautions discussed with her.  She says she recently had blood work done at Dr. Nicky Pugh office, I requested she have copy faxed to [place] for our records.  4)  Weight/Diet: Her Body mass index is 30.9 kg/m.- clearly complicating her diabetes care.  She is a candidate for modest  weight loss.  CDE Consult will be initiated , exercise, and detailed carbohydrates information provided.  She has missed her appointments with Jearld Fenton, RDE due to transportation issues.    5) Chronic Care/Health Maintenance: -she is on ACEI medications and statin is encouraged to continue to follow up with Ophthalmology, Dentist, Podiatrist at least yearly or according to recommendations, and advised to quit smoking. I have recommended yearly flu vaccine and pneumonia vaccination at least every 5 years; moderate intensity exercise for up to 150 minutes weekly; and  sleep for at least 7 hours a day.  -The patient was counseled on the dangers of tobacco use, and was advised to quit.  Reviewed strategies to maximize success, including removing cigarettes and smoking materials from environment.  - I advised patient to maintain close follow up with Carrolyn Meiers, MD for primary care needs.       I spent 45 minutes in the care of the patient today including review of labs from [facility], Lipids, Thyroid Function, Hematology (current and previous including abstractions from other facilities); face-to-face time discussing  her blood glucose readings/logs, discussing hypoglycemia and  hyperglycemia episodes and symptoms, medications doses, her options of short and long term treatment based on the latest standards of care / guidelines;  discussion about incorporating lifestyle medicine;  and documenting the encounter.    Please refer to Patient Instructions for Blood Glucose Monitoring and Insulin/Medications Dosing Guide"  in media tab for additional information. Please  also refer to " Patient Self Inventory" in the Media  tab for reviewed elements of pertinent patient history.  Kari Miles participated in the discussions, expressed understanding, and voiced agreement with the above plans.  All questions were answered to her satisfaction. she is encouraged to contact clinic should she have any  questions or concerns prior to her return visit.    Follow up plan: Return in about 3 months (around 10/26/2021) for Diabetes F/U with A1c in office, No previsit labs, Bring meter and logs.   Kari Miles, [facility] [address] [place], [place] [address] Phone: [phone] Fax: [phone]  07/28/2021, 10:53 AM

## 2021-07-30 DIAGNOSIS — Z9181 History of falling: Secondary | ICD-10-CM | POA: Diagnosis not present

## 2021-07-30 DIAGNOSIS — I10 Essential (primary) hypertension: Secondary | ICD-10-CM | POA: Diagnosis not present

## 2021-07-30 DIAGNOSIS — E119 Type 2 diabetes mellitus without complications: Secondary | ICD-10-CM | POA: Diagnosis not present

## 2021-07-30 DIAGNOSIS — Z794 Long term (current) use of insulin: Secondary | ICD-10-CM | POA: Diagnosis not present

## 2021-07-30 DIAGNOSIS — M1990 Unspecified osteoarthritis, unspecified site: Secondary | ICD-10-CM | POA: Diagnosis not present

## 2021-07-30 DIAGNOSIS — Z7984 Long term (current) use of oral hypoglycemic drugs: Secondary | ICD-10-CM | POA: Diagnosis not present

## 2021-07-30 DIAGNOSIS — F17210 Nicotine dependence, cigarettes, uncomplicated: Secondary | ICD-10-CM | POA: Diagnosis not present

## 2021-07-30 DIAGNOSIS — J45909 Unspecified asthma, uncomplicated: Secondary | ICD-10-CM | POA: Diagnosis not present

## 2021-07-30 DIAGNOSIS — E785 Hyperlipidemia, unspecified: Secondary | ICD-10-CM | POA: Diagnosis not present

## 2021-08-06 DIAGNOSIS — Z9181 History of falling: Secondary | ICD-10-CM | POA: Diagnosis not present

## 2021-08-06 DIAGNOSIS — E119 Type 2 diabetes mellitus without complications: Secondary | ICD-10-CM | POA: Diagnosis not present

## 2021-08-06 DIAGNOSIS — F17210 Nicotine dependence, cigarettes, uncomplicated: Secondary | ICD-10-CM | POA: Diagnosis not present

## 2021-08-06 DIAGNOSIS — I10 Essential (primary) hypertension: Secondary | ICD-10-CM | POA: Diagnosis not present

## 2021-08-06 DIAGNOSIS — E785 Hyperlipidemia, unspecified: Secondary | ICD-10-CM | POA: Diagnosis not present

## 2021-08-06 DIAGNOSIS — M1990 Unspecified osteoarthritis, unspecified site: Secondary | ICD-10-CM | POA: Diagnosis not present

## 2021-08-06 DIAGNOSIS — Z794 Long term (current) use of insulin: Secondary | ICD-10-CM | POA: Diagnosis not present

## 2021-08-06 DIAGNOSIS — Z7984 Long term (current) use of oral hypoglycemic drugs: Secondary | ICD-10-CM | POA: Diagnosis not present

## 2021-08-06 DIAGNOSIS — J45909 Unspecified asthma, uncomplicated: Secondary | ICD-10-CM | POA: Diagnosis not present

## 2021-08-12 DIAGNOSIS — J45909 Unspecified asthma, uncomplicated: Secondary | ICD-10-CM | POA: Diagnosis not present

## 2021-08-12 DIAGNOSIS — F17210 Nicotine dependence, cigarettes, uncomplicated: Secondary | ICD-10-CM | POA: Diagnosis not present

## 2021-08-12 DIAGNOSIS — E119 Type 2 diabetes mellitus without complications: Secondary | ICD-10-CM | POA: Diagnosis not present

## 2021-08-12 DIAGNOSIS — M1990 Unspecified osteoarthritis, unspecified site: Secondary | ICD-10-CM | POA: Diagnosis not present

## 2021-08-12 DIAGNOSIS — Z7984 Long term (current) use of oral hypoglycemic drugs: Secondary | ICD-10-CM | POA: Diagnosis not present

## 2021-08-12 DIAGNOSIS — Z9181 History of falling: Secondary | ICD-10-CM | POA: Diagnosis not present

## 2021-08-12 DIAGNOSIS — I10 Essential (primary) hypertension: Secondary | ICD-10-CM | POA: Diagnosis not present

## 2021-08-12 DIAGNOSIS — E785 Hyperlipidemia, unspecified: Secondary | ICD-10-CM | POA: Diagnosis not present

## 2021-08-12 DIAGNOSIS — Z794 Long term (current) use of insulin: Secondary | ICD-10-CM | POA: Diagnosis not present

## 2021-08-19 DIAGNOSIS — F17210 Nicotine dependence, cigarettes, uncomplicated: Secondary | ICD-10-CM | POA: Diagnosis not present

## 2021-08-19 DIAGNOSIS — Z794 Long term (current) use of insulin: Secondary | ICD-10-CM | POA: Diagnosis not present

## 2021-08-19 DIAGNOSIS — I10 Essential (primary) hypertension: Secondary | ICD-10-CM | POA: Diagnosis not present

## 2021-08-19 DIAGNOSIS — Z9181 History of falling: Secondary | ICD-10-CM | POA: Diagnosis not present

## 2021-08-19 DIAGNOSIS — E119 Type 2 diabetes mellitus without complications: Secondary | ICD-10-CM | POA: Diagnosis not present

## 2021-08-19 DIAGNOSIS — J45909 Unspecified asthma, uncomplicated: Secondary | ICD-10-CM | POA: Diagnosis not present

## 2021-08-19 DIAGNOSIS — Z7984 Long term (current) use of oral hypoglycemic drugs: Secondary | ICD-10-CM | POA: Diagnosis not present

## 2021-08-19 DIAGNOSIS — M1990 Unspecified osteoarthritis, unspecified site: Secondary | ICD-10-CM | POA: Diagnosis not present

## 2021-08-19 DIAGNOSIS — E785 Hyperlipidemia, unspecified: Secondary | ICD-10-CM | POA: Diagnosis not present

## 2021-08-26 DIAGNOSIS — E119 Type 2 diabetes mellitus without complications: Secondary | ICD-10-CM | POA: Diagnosis not present

## 2021-08-26 DIAGNOSIS — E1140 Type 2 diabetes mellitus with diabetic neuropathy, unspecified: Secondary | ICD-10-CM | POA: Diagnosis not present

## 2021-08-26 DIAGNOSIS — I10 Essential (primary) hypertension: Secondary | ICD-10-CM | POA: Diagnosis not present

## 2021-09-04 ENCOUNTER — Other Ambulatory Visit: Payer: Self-pay | Admitting: Nurse Practitioner

## 2021-09-04 DIAGNOSIS — I10 Essential (primary) hypertension: Secondary | ICD-10-CM | POA: Diagnosis not present

## 2021-09-04 DIAGNOSIS — Z9181 History of falling: Secondary | ICD-10-CM | POA: Diagnosis not present

## 2021-09-04 DIAGNOSIS — Z794 Long term (current) use of insulin: Secondary | ICD-10-CM | POA: Diagnosis not present

## 2021-09-04 DIAGNOSIS — J45909 Unspecified asthma, uncomplicated: Secondary | ICD-10-CM | POA: Diagnosis not present

## 2021-09-04 DIAGNOSIS — M1990 Unspecified osteoarthritis, unspecified site: Secondary | ICD-10-CM | POA: Diagnosis not present

## 2021-09-04 DIAGNOSIS — E785 Hyperlipidemia, unspecified: Secondary | ICD-10-CM | POA: Diagnosis not present

## 2021-09-04 DIAGNOSIS — F17210 Nicotine dependence, cigarettes, uncomplicated: Secondary | ICD-10-CM | POA: Diagnosis not present

## 2021-09-04 DIAGNOSIS — E119 Type 2 diabetes mellitus without complications: Secondary | ICD-10-CM | POA: Diagnosis not present

## 2021-09-04 DIAGNOSIS — Z7984 Long term (current) use of oral hypoglycemic drugs: Secondary | ICD-10-CM | POA: Diagnosis not present

## 2021-09-11 DIAGNOSIS — E785 Hyperlipidemia, unspecified: Secondary | ICD-10-CM | POA: Diagnosis not present

## 2021-09-11 DIAGNOSIS — Z9181 History of falling: Secondary | ICD-10-CM | POA: Diagnosis not present

## 2021-09-11 DIAGNOSIS — F17210 Nicotine dependence, cigarettes, uncomplicated: Secondary | ICD-10-CM | POA: Diagnosis not present

## 2021-09-11 DIAGNOSIS — M1990 Unspecified osteoarthritis, unspecified site: Secondary | ICD-10-CM | POA: Diagnosis not present

## 2021-09-11 DIAGNOSIS — Z7984 Long term (current) use of oral hypoglycemic drugs: Secondary | ICD-10-CM | POA: Diagnosis not present

## 2021-09-11 DIAGNOSIS — E119 Type 2 diabetes mellitus without complications: Secondary | ICD-10-CM | POA: Diagnosis not present

## 2021-09-11 DIAGNOSIS — J45909 Unspecified asthma, uncomplicated: Secondary | ICD-10-CM | POA: Diagnosis not present

## 2021-09-11 DIAGNOSIS — I10 Essential (primary) hypertension: Secondary | ICD-10-CM | POA: Diagnosis not present

## 2021-09-11 DIAGNOSIS — Z794 Long term (current) use of insulin: Secondary | ICD-10-CM | POA: Diagnosis not present

## 2021-09-16 DIAGNOSIS — R051 Acute cough: Secondary | ICD-10-CM | POA: Diagnosis not present

## 2021-09-18 DIAGNOSIS — Z9181 History of falling: Secondary | ICD-10-CM | POA: Diagnosis not present

## 2021-09-18 DIAGNOSIS — E119 Type 2 diabetes mellitus without complications: Secondary | ICD-10-CM | POA: Diagnosis not present

## 2021-09-18 DIAGNOSIS — M1990 Unspecified osteoarthritis, unspecified site: Secondary | ICD-10-CM | POA: Diagnosis not present

## 2021-09-18 DIAGNOSIS — H905 Unspecified sensorineural hearing loss: Secondary | ICD-10-CM | POA: Diagnosis not present

## 2021-09-18 DIAGNOSIS — Z794 Long term (current) use of insulin: Secondary | ICD-10-CM | POA: Diagnosis not present

## 2021-09-18 DIAGNOSIS — I10 Essential (primary) hypertension: Secondary | ICD-10-CM | POA: Diagnosis not present

## 2021-09-18 DIAGNOSIS — J45909 Unspecified asthma, uncomplicated: Secondary | ICD-10-CM | POA: Diagnosis not present

## 2021-09-18 DIAGNOSIS — E785 Hyperlipidemia, unspecified: Secondary | ICD-10-CM | POA: Diagnosis not present

## 2021-09-18 DIAGNOSIS — F17210 Nicotine dependence, cigarettes, uncomplicated: Secondary | ICD-10-CM | POA: Diagnosis not present

## 2021-09-18 DIAGNOSIS — Z7984 Long term (current) use of oral hypoglycemic drugs: Secondary | ICD-10-CM | POA: Diagnosis not present

## 2021-09-23 DIAGNOSIS — E1140 Type 2 diabetes mellitus with diabetic neuropathy, unspecified: Secondary | ICD-10-CM | POA: Diagnosis not present

## 2021-09-23 DIAGNOSIS — I10 Essential (primary) hypertension: Secondary | ICD-10-CM | POA: Diagnosis not present

## 2021-09-25 DIAGNOSIS — E785 Hyperlipidemia, unspecified: Secondary | ICD-10-CM | POA: Diagnosis not present

## 2021-09-25 DIAGNOSIS — J45909 Unspecified asthma, uncomplicated: Secondary | ICD-10-CM | POA: Diagnosis not present

## 2021-09-25 DIAGNOSIS — Z9181 History of falling: Secondary | ICD-10-CM | POA: Diagnosis not present

## 2021-09-25 DIAGNOSIS — I10 Essential (primary) hypertension: Secondary | ICD-10-CM | POA: Diagnosis not present

## 2021-09-25 DIAGNOSIS — F17210 Nicotine dependence, cigarettes, uncomplicated: Secondary | ICD-10-CM | POA: Diagnosis not present

## 2021-09-25 DIAGNOSIS — Z7984 Long term (current) use of oral hypoglycemic drugs: Secondary | ICD-10-CM | POA: Diagnosis not present

## 2021-09-25 DIAGNOSIS — M1990 Unspecified osteoarthritis, unspecified site: Secondary | ICD-10-CM | POA: Diagnosis not present

## 2021-09-25 DIAGNOSIS — Z794 Long term (current) use of insulin: Secondary | ICD-10-CM | POA: Diagnosis not present

## 2021-09-25 DIAGNOSIS — E119 Type 2 diabetes mellitus without complications: Secondary | ICD-10-CM | POA: Diagnosis not present

## 2021-09-26 DIAGNOSIS — E119 Type 2 diabetes mellitus without complications: Secondary | ICD-10-CM | POA: Diagnosis not present

## 2021-10-21 ENCOUNTER — Telehealth: Payer: Self-pay | Admitting: Nurse Practitioner

## 2021-10-21 ENCOUNTER — Other Ambulatory Visit: Payer: Self-pay

## 2021-10-21 MED ORDER — ONETOUCH VERIO FLEX SYSTEM W/DEVICE KIT: PACK | 0 refills | Status: DC

## 2021-10-21 MED ORDER — ONETOUCH VERIO VI STRP: ORAL_STRIP | 1 refills | Status: DC

## 2021-10-21 MED ORDER — ONETOUCH DELICA LANCETS 33G MISC: 1 refills | Status: DC

## 2021-10-21 NOTE — Telephone Encounter (Signed)
Pt called and said Kari Miles told her they have reached out to [place] about her medication and they are waiting on response.  ?

## 2021-10-21 NOTE — Telephone Encounter (Signed)
I called [facility] and they stated that they have faxed [place] a request for the OneTouch meter and supplies. I let her know that we did not receive this fax. I have sent the OneTouch Verio meter and supplies to them. ?

## 2021-10-28 ENCOUNTER — Ambulatory Visit: Payer: Medicare Other | Admitting: Nurse Practitioner

## 2021-11-03 DIAGNOSIS — J410 Simple chronic bronchitis: Secondary | ICD-10-CM | POA: Diagnosis not present

## 2021-11-03 DIAGNOSIS — M170 Bilateral primary osteoarthritis of knee: Secondary | ICD-10-CM | POA: Diagnosis not present

## 2021-11-03 DIAGNOSIS — I10 Essential (primary) hypertension: Secondary | ICD-10-CM | POA: Diagnosis not present

## 2021-11-03 DIAGNOSIS — E1140 Type 2 diabetes mellitus with diabetic neuropathy, unspecified: Secondary | ICD-10-CM | POA: Diagnosis not present

## 2021-11-06 ENCOUNTER — Ambulatory Visit (INDEPENDENT_AMBULATORY_CARE_PROVIDER_SITE_OTHER): Payer: Medicare Other | Admitting: Nurse Practitioner

## 2021-11-06 ENCOUNTER — Encounter: Payer: Self-pay | Admitting: Nurse Practitioner

## 2021-11-06 VITALS — BP 119/81 | HR 81 | Ht 64.0 in | Wt 182.0 lb

## 2021-11-06 DIAGNOSIS — E1165 Type 2 diabetes mellitus with hyperglycemia: Secondary | ICD-10-CM | POA: Diagnosis not present

## 2021-11-06 DIAGNOSIS — E782 Mixed hyperlipidemia: Secondary | ICD-10-CM

## 2021-11-06 DIAGNOSIS — I10 Essential (primary) hypertension: Secondary | ICD-10-CM

## 2021-11-06 LAB — POCT GLYCOSYLATED HEMOGLOBIN (HGB A1C): HbA1c POC (<> result, manual entry): 12.3 % (ref 4.0–5.6)

## 2021-11-06 NOTE — Progress Notes (Signed)
? ?                                                Endocrinology follow-up note  ?     11/06/2021, 10:07 AM ? ? ?Subjective:  ? ? Patient ID: Kari Miles, female    DOB: 07/07/1960.  ?Kari Miles is being seen in follow-up for management of chronically uncontrolled type 2 diabetes.  She also has hypertension, hyperlipidemia on treatment.   ? ?PMD:  Fanta, Tesfaye Demissie, MD. ? ? ?Past Medical History:  ?Diagnosis Date  ? Arthritis   ? Carpal tunnel syndrome   ? Chronic back pain   ? Diabetes mellitus without complication (HCC)   ? Hypertension   ? Tendonitis   ? ?Past Surgical History:  ?Procedure Laterality Date  ? ABDOMINAL HYSTERECTOMY    ? CESAREAN SECTION    ? ORTHOPEDIC SURGERY    ? ?Social History  ? ?Socioeconomic History  ? Marital status: Widowed  ?  Spouse name: Not on file  ? Number of children: Not on file  ? Years of education: Not on file  ? Highest education level: Not on file  ?Occupational History  ? Not on file  ?Tobacco Use  ? Smoking status: Every Day  ?  Packs/day: 0.10  ?  Years: 30.00  ?  Pack years: 3.00  ?  Types: Cigarettes  ? Smokeless tobacco: Never  ?Vaping Use  ? Vaping Use: Never used  ?Substance and Sexual Activity  ? Alcohol use: No  ? Drug use: No  ? Sexual activity: Not on file  ?Other Topics Concern  ? Not on file  ?Social History Narrative  ? Not on file  ? ?Social Determinants of Health  ? ?Financial Resource Strain: Not on file  ?Food Insecurity: Not on file  ?Transportation Needs: Not on file  ?Physical Activity: Not on file  ?Stress: Not on file  ?Social Connections: Not on file  ? ? ?Family History  ?Problem Relation Age of Onset  ? Seizures Mother   ? Heart disease Father   ? Cancer Sister   ? Heart attack Sister   ? Colon cancer Neg Hx   ? ?Outpatient Encounter Medications as of 11/06/2021  ?Medication Sig  ? albuterol (PROVENTIL HFA;VENTOLIN HFA) 108 (90 Base) MCG/ACT inhaler Inhale 2 puffs into the lungs every 6 (six) hours as needed for wheezing  or shortness of breath.  ? Alcohol Swabs 70 % PADS Use as directed to clean skin prior to injections tid and checking blood glucose tid  ? amLODipine (NORVASC) 5 MG tablet Take 5 mg by mouth daily.  ? aspirin 81 MG EC tablet Take by mouth.  ? aspirin EC 81 MG tablet Take 81 mg by mouth daily.  ? atorvastatin (LIPITOR) 40 MG tablet TAKE ONE TABLET BY MOUTH ONCE DAILY  ? blood glucose meter kit and supplies KIT Dispense based on patient and insurance preference. Use four times daily as directed.  ? Blood Glucose Monitoring Suppl (ONETOUCH VERIO FLEX SYSTEM) w/Device KIT Use to check blood glucose 4 times daily.  ? Blood Glucose Monitoring Suppl (ONETOUCH VERIO REFLECT) w/Device KIT by Does not apply route in the morning, at noon, in the evening, and at bedtime.  ? butalbital-acetaminophen-caffeine (FIORICET) 50-325-40 MG tablet TAKE 1 OR 2 TABLETS BY MOUTH EVERY 4 HOURS AS NEEDED  ?   cephALEXin (KEFLEX) 500 MG capsule Take 500 mg by mouth 3 (three) times daily.  ? cloNIDine (CATAPRES) 0.1 MG tablet Take 0.1 mg by mouth 2 (two) times daily.  ? cyclobenzaprine (FLEXERIL) 5 MG tablet Take 5 mg by mouth 3 (three) times daily as needed.  ? diphenhydrAMINE (BENADRYL) 25 MG tablet Take 25 mg by mouth daily as needed for allergies.  ? diphenhydrAMINE (SOMINEX) 25 MG tablet Take by mouth.  ? ezetimibe (ZETIA) 10 MG tablet Take 10 mg by mouth daily.  ? famotidine (PEPCID) 20 MG tablet Take by mouth.  ? gabapentin (NEURONTIN) 300 MG capsule Take 300 mg by mouth 3 (three) times daily.  ? glipiZIDE (GLUCOTROL XL) 10 MG 24 hr tablet Take 1 tablet (10 mg total) by mouth daily with breakfast.  ? glucose blood (ONETOUCH VERIO) test strip Use as instructed to check blood glucose 4 times daily.  ? glucose blood test strip 1 each by Other route 4 (four) times daily. Use as instructed 4 x daily. E11.65 True Track  ? hydrochlorothiazide (HYDRODIURIL) 25 MG tablet Take 25 mg by mouth daily.  ? ibuprofen (ADVIL) 800 MG tablet Take 800 mg by  mouth 2 (two) times daily as needed.  ? Ibuprofen-diphenhydrAMINE Cit 200-38 MG TABS Take 1 tablet by mouth at bedtime as needed (sleep).  ? insulin aspart (FIASP FLEXTOUCH) 100 UNIT/ML FlexTouch Pen Inject 7-13 Units into the skin 3 (three) times daily with meals.  ? insulin detemir (LEVEMIR FLEXTOUCH) 100 UNIT/ML FlexPen Inject 60 Units into the skin at bedtime.  ? Lancet Devices (SIMPLE DIAGNOSTICS LANCING DEV) MISC Use 1 device As directed To test your blood sugar  ? LINZESS 145 MCG CAPS capsule Take 145 mcg by mouth daily.  ? lisinopril (ZESTRIL) 40 MG tablet Take 40 mg by mouth daily.  ? metFORMIN (GLUCOPHAGE) 500 MG tablet TAKE ONE TABLET BY MOUTH TWICE DAILY. TAKE WITH A MEAL. (Patient taking differently: Take 500 mg by mouth 2 (two) times daily with a meal.)  ? metoCLOPramide (REGLAN) 5 MG tablet Take 5 mg by mouth 3 (three) times daily.  ? omeprazole (PRILOSEC) 20 MG capsule Take 2 capsules (40 mg total) by mouth daily.  ? ondansetron (ZOFRAN) 8 MG tablet Take 8 mg by mouth every 6 (six) hours as needed.  ? ondansetron (ZOFRAN-ODT) 4 MG disintegrating tablet Take 4 mg by mouth every 8 (eight) hours as needed.  ? OneTouch Delica Lancets 10R MISC Use to check blood glucose 4 times daily.  ? polyethylene glycol (MIRALAX / GLYCOLAX) 17 g packet 1 packet DAILY (route: oral)  ? Polyethylene Glycol 400 0.25 % SOLN Place 1 drop into both eyes every 6 (six) hours as needed (dry eyes).   ? polyethylene glycol powder (GLYCOLAX/MIRALAX) 17 GM/SCOOP powder Take 17 g by mouth daily.  ? spironolactone (ALDACTONE) 25 MG tablet Take 25 mg by mouth 2 (two) times daily.  ? sucralfate (CARAFATE) 1 GM/10ML suspension Take 10 mLs (1 g total) by mouth 4 (four) times daily -  with meals and at bedtime.  ? ULTICARE SHORT PEN NEEDLES 31G X 8 MM MISC USE FOUR TIMES DAILY  ? ?No facility-administered encounter medications on file as of 11/06/2021.  ? ? ?ALLERGIES: ?No Known Allergies ? ?VACCINATION STATUS: ?Immunization History   ?Administered Date(s) Administered  ? Influenza-Unspecified 08/28/2018  ? Moderna Sars-Covid-2 Vaccination 09/21/2019, 10/19/2019  ? ? ?Diabetes ?She presents for her follow-up diabetic visit. She has type 2 diabetes mellitus. Onset time: she was diagnosed at approximate  age of 34 years. Her disease course has been worsening. There are no hypoglycemic associated symptoms. Pertinent negatives for hypoglycemia include no headaches, pallor or seizures. Associated symptoms include foot paresthesias, polydipsia, polyuria and weight loss. Pertinent negatives for diabetes include no chest pain and no polyphagia. There are no hypoglycemic complications. Symptoms are stable. Diabetic complications include nephropathy and peripheral neuropathy. Risk factors for coronary artery disease include diabetes mellitus, dyslipidemia, family history, hypertension, obesity, sedentary lifestyle, tobacco exposure and post-menopausal. Current diabetic treatment includes intensive insulin program and oral agent (dual therapy). She is compliant with treatment most of the time. Her weight is fluctuating minimally. She is following a generally unhealthy (still consumes sodas (has cut back)) diet. When asked about meal planning, she reported none. She has not had a previous visit with a dietitian. She never participates in exercise. Her home blood glucose trend is increasing steadily. Her overall blood glucose range is >200 mg/dl. (She presents today with her meter, no logs, showing only 4 readings ranging back to middle of April (she says she had to have new meter recently) and assures me she is checking her glucose.  Her POCT A1c today is 12.3%, worsening yet again from last visit of 10.1%.  She reports she has been sick recently, no steroid use.  Her meter shows gross hyperglycemia overall in the 300-400 range. ?) An ACE inhibitor/angiotensin II receptor blocker is being taken. She does not see a podiatrist.Eye exam is not current.   ?Hypertension ?This is a chronic problem. The current episode started more than 1 year ago. The problem has been waxing and waning since onset. The problem is controlled. Pertinent negatives include no chest pain,

## 2021-11-06 NOTE — Patient Instructions (Signed)
Diabetes Mellitus and Foot Care Foot care is an important part of your health, especially when you have diabetes. Diabetes may cause you to have problems because of poor blood flow (circulation) to your feet and legs, which can cause your skin to: Become thinner and drier. Break more easily. Heal more slowly. Peel and crack. You may also have nerve damage (neuropathy) in your legs and feet, causing decreased feeling in them. This means that you may not notice minor injuries to your feet that could lead to more serious problems. Noticing and addressing any potential problems early is the best way to prevent future foot problems. How to care for your feet Foot hygiene  Wash your feet daily with warm water and mild soap. Do not use hot water. Then, pat your feet and the areas between your toes until they are completely dry. Do not soak your feet as this can dry your skin. Trim your toenails straight across. Do not dig under them or around the cuticle. File the edges of your nails with an emery board or nail file. Apply a moisturizing lotion or petroleum jelly to the skin on your feet and to dry, brittle toenails. Use lotion that does not contain alcohol and is unscented. Do not apply lotion between your toes. Shoes and socks Wear clean socks or stockings every day. Make sure they are not too tight. Do not wear knee-high stockings since they may decrease blood flow to your legs. Wear shoes that fit properly and have enough cushioning. Always look in your shoes before you put them on to be sure there are no objects inside. To break in new shoes, wear them for just a few hours a day. This prevents injuries on your feet. Wounds, scrapes, corns, and calluses  Check your feet daily for blisters, cuts, bruises, sores, and redness. If you cannot see the bottom of your feet, use a mirror or ask someone for help. Do not cut corns or calluses or try to remove them with medicine. If you find a minor scrape,  cut, or break in the skin on your feet, keep it and the skin around it clean and dry. You may clean these areas with mild soap and water. Do not clean the area with peroxide, alcohol, or iodine. If you have a wound, scrape, corn, or callus on your foot, look at it several times a day to make sure it is healing and not infected. Check for: Redness, swelling, or pain. Fluid or blood. Warmth. Pus or a bad smell. General tips Do not cross your legs. This may decrease blood flow to your feet. Do not use heating pads or hot water bottles on your feet. They may burn your skin. If you have lost feeling in your feet or legs, you may not know this is happening until it is too late. Protect your feet from hot and cold by wearing shoes, such as at the beach or on hot pavement. Schedule a complete foot exam at least once a year (annually) or more often if you have foot problems. Report any cuts, sores, or bruises to your health care provider immediately. Where to find more information American Diabetes Association: www.diabetes.org Association of Diabetes [employer] & Education Specialists: www.diabeteseducator.org Contact a health care provider if: You have a medical condition that increases your risk of infection and you have any cuts, sores, or bruises on your feet. You have an injury that is not healing. You have redness on your legs or feet. You   feel burning or tingling in your legs or feet. You have pain or cramps in your legs and feet. Your legs or feet are numb. Your feet always feel cold. You have pain around any toenails. Get help right away if: You have a wound, scrape, corn, or callus on your foot and: You have pain, swelling, or redness that gets worse. You have fluid or blood coming from the wound, scrape, corn, or callus. Your wound, scrape, corn, or callus feels warm to the touch. You have pus or a bad smell coming from the wound, scrape, corn, or callus. You have a fever. You have a red  line going up your leg. Summary Check your feet every day for blisters, cuts, bruises, sores, and redness. Apply a moisturizing lotion or petroleum jelly to the skin on your feet and to dry, brittle toenails. Wear shoes that fit properly and have enough cushioning. If you have foot problems, report any cuts, sores, or bruises to your health care provider immediately. Schedule a complete foot exam at least once a year (annually) or more often if you have foot problems. This information is not intended to replace advice given to you by your health care provider. Make sure you discuss any questions you have with your health care provider. Document Revised: 01/04/2020 Document Reviewed: 01/04/2020 [employer] Patient Education  2023 [employer]  

## 2021-12-03 ENCOUNTER — Other Ambulatory Visit: Payer: Self-pay | Admitting: Nurse Practitioner

## 2021-12-05 DIAGNOSIS — E1140 Type 2 diabetes mellitus with diabetic neuropathy, unspecified: Secondary | ICD-10-CM | POA: Diagnosis not present

## 2021-12-05 DIAGNOSIS — I10 Essential (primary) hypertension: Secondary | ICD-10-CM | POA: Diagnosis not present

## 2021-12-08 ENCOUNTER — Ambulatory Visit: Payer: Medicare (Managed Care) | Admitting: Nurse Practitioner

## 2021-12-16 ENCOUNTER — Ambulatory Visit (INDEPENDENT_AMBULATORY_CARE_PROVIDER_SITE_OTHER): Payer: Medicare Other | Admitting: Nurse Practitioner

## 2021-12-16 ENCOUNTER — Encounter: Payer: Self-pay | Admitting: Nurse Practitioner

## 2021-12-16 VITALS — BP 110/77 | HR 99 | Ht 64.0 in | Wt 181.0 lb

## 2021-12-16 DIAGNOSIS — E782 Mixed hyperlipidemia: Secondary | ICD-10-CM

## 2021-12-16 DIAGNOSIS — I10 Essential (primary) hypertension: Secondary | ICD-10-CM | POA: Diagnosis not present

## 2021-12-16 DIAGNOSIS — E1165 Type 2 diabetes mellitus with hyperglycemia: Secondary | ICD-10-CM

## 2021-12-16 MED ORDER — FIASP FLEXTOUCH 100 UNIT/ML ~~LOC~~ SOPN: 10.0000 [IU] | PEN_INJECTOR | Freq: Three times a day (TID) | SUBCUTANEOUS | 3 refills | Status: DC

## 2021-12-16 NOTE — Patient Instructions (Signed)
Diabetes Mellitus and Foot Care Foot care is an important part of your health, especially when you have diabetes. Diabetes may cause you to have problems because of poor blood flow (circulation) to your feet and legs, which can cause your skin to: Become thinner and drier. Break more easily. Heal more slowly. Peel and crack. You may also have nerve damage (neuropathy) in your legs and feet, causing decreased feeling in them. This means that you may not notice minor injuries to your feet that could lead to more serious problems. Noticing and addressing any potential problems early is the best way to prevent future foot problems. How to care for your feet Foot hygiene  Wash your feet daily with warm water and mild soap. Do not use hot water. Then, pat your feet and the areas between your toes until they are completely dry. Do not soak your feet as this can dry your skin. Trim your toenails straight across. Do not dig under them or around the cuticle. File the edges of your nails with an emery board or nail file. Apply a moisturizing lotion or petroleum jelly to the skin on your feet and to dry, brittle toenails. Use lotion that does not contain alcohol and is unscented. Do not apply lotion between your toes. Shoes and socks Wear clean socks or stockings every day. Make sure they are not too tight. Do not wear knee-high stockings since they may decrease blood flow to your legs. Wear shoes that fit properly and have enough cushioning. Always look in your shoes before you put them on to be sure there are no objects inside. To break in new shoes, wear them for just a few hours a day. This prevents injuries on your feet. Wounds, scrapes, corns, and calluses  Check your feet daily for blisters, cuts, bruises, sores, and redness. If you cannot see the bottom of your feet, use a mirror or ask someone for help. Do not cut corns or calluses or try to remove them with medicine. If you find a minor scrape,  cut, or break in the skin on your feet, keep it and the skin around it clean and dry. You may clean these areas with mild soap and water. Do not clean the area with peroxide, alcohol, or iodine. If you have a wound, scrape, corn, or callus on your foot, look at it several times a day to make sure it is healing and not infected. Check for: Redness, swelling, or pain. Fluid or blood. Warmth. Pus or a bad smell. General tips Do not cross your legs. This may decrease blood flow to your feet. Do not use heating pads or hot water bottles on your feet. They may burn your skin. If you have lost feeling in your feet or legs, you may not know this is happening until it is too late. Protect your feet from hot and cold by wearing shoes, such as at the beach or on hot pavement. Schedule a complete foot exam at least once a year (annually) or more often if you have foot problems. Report any cuts, sores, or bruises to your health care provider immediately. Where to find more information American Diabetes Association: www.diabetes.org Association of Diabetes [employer] & Education Specialists: www.diabeteseducator.org Contact a health care provider if: You have a medical condition that increases your risk of infection and you have any cuts, sores, or bruises on your feet. You have an injury that is not healing. You have redness on your legs or feet. You   feel burning or tingling in your legs or feet. You have pain or cramps in your legs and feet. Your legs or feet are numb. Your feet always feel cold. You have pain around any toenails. Get help right away if: You have a wound, scrape, corn, or callus on your foot and: You have pain, swelling, or redness that gets worse. You have fluid or blood coming from the wound, scrape, corn, or callus. Your wound, scrape, corn, or callus feels warm to the touch. You have pus or a bad smell coming from the wound, scrape, corn, or callus. You have a fever. You have a red  line going up your leg. Summary Check your feet every day for blisters, cuts, bruises, sores, and redness. Apply a moisturizing lotion or petroleum jelly to the skin on your feet and to dry, brittle toenails. Wear shoes that fit properly and have enough cushioning. If you have foot problems, report any cuts, sores, or bruises to your health care provider immediately. Schedule a complete foot exam at least once a year (annually) or more often if you have foot problems. This information is not intended to replace advice given to you by your health care provider. Make sure you discuss any questions you have with your health care provider. Document Revised: 01/04/2020 Document Reviewed: 01/04/2020 [employer] Patient Education  2023 [employer]  

## 2021-12-16 NOTE — Progress Notes (Signed)
Endocrinology follow-up note       12/16/2021, 11:50 AM   Subjective:    Patient ID: Kari Miles, female    DOB: 06-16-1961.  Kari Miles is being seen in follow-up for management of chronically uncontrolled type 2 diabetes.  She also has hypertension, hyperlipidemia on treatment.    PMD:  Carrolyn Meiers, MD.   Past Medical History:  Diagnosis Date   Arthritis    Carpal tunnel syndrome    Chronic back pain    Diabetes mellitus without complication ([place])    Hypertension    Tendonitis    Past Surgical History:  Procedure Laterality Date   ABDOMINAL HYSTERECTOMY     CESAREAN SECTION     ORTHOPEDIC SURGERY     Social History   Socioeconomic History   Marital status: Widowed    Spouse name: Not on file   Number of children: Not on file   Years of education: Not on file   Highest education level: Not on file  Occupational History   Not on file  Tobacco Use   Smoking status: Every Day    Packs/day: 0.10    Years: 30.00    Total pack years: 3.00    Types: Cigarettes   Smokeless tobacco: Never  Vaping Use   Vaping Use: Never used  Substance and Sexual Activity   Alcohol use: No   Drug use: No   Sexual activity: Not on file  Other Topics Concern   Not on file  Social History Narrative   Not on file   Social Determinants of Health   Financial Resource Strain: Not on file  Food Insecurity: Not on file  Transportation Needs: Not on file  Physical Activity: Not on file  Stress: Not on file  Social Connections: Not on file    Family History  Problem Relation Age of Onset   Seizures Mother    Heart disease Father    Cancer Sister    Heart attack Sister    Colon cancer Neg Hx    Outpatient Encounter Medications as of 12/16/2021  Medication Sig   albuterol (PROVENTIL HFA;VENTOLIN HFA) 108 (90 Base) MCG/ACT inhaler Inhale 2 puffs into the lungs every 6 (six) hours as needed for  wheezing or shortness of breath.   Alcohol Swabs 70 % PADS Use as directed to clean skin prior to injections tid and checking blood glucose tid   amLODipine (NORVASC) 5 MG tablet Take 5 mg by mouth daily.   aspirin 81 MG EC tablet Take by mouth.   aspirin EC 81 MG tablet Take 81 mg by mouth daily.   atorvastatin (LIPITOR) 40 MG tablet TAKE ONE TABLET BY MOUTH ONCE DAILY   blood glucose meter kit and supplies KIT Dispense based on patient and insurance preference. Use four times daily as directed.   Blood Glucose Monitoring Suppl ([place]) w/Device KIT Use to check blood glucose 4 times daily.   Blood Glucose Monitoring Suppl (ONETOUCH VERIO REFLECT) w/Device KIT by Does not apply route in the morning, at noon, in the evening, and at bedtime.   butalbital-acetaminophen-caffeine (FIORICET) 50-325-40 MG tablet TAKE 1 OR 2 TABLETS BY MOUTH EVERY 4 HOURS AS  NEEDED   cephALEXin (KEFLEX) 500 MG capsule Take 500 mg by mouth 3 (three) times daily.   cloNIDine (CATAPRES) 0.1 MG tablet Take 0.1 mg by mouth 2 (two) times daily.   cyclobenzaprine (FLEXERIL) 5 MG tablet Take 5 mg by mouth 3 (three) times daily as needed.   diphenhydrAMINE (BENADRYL) 25 MG tablet Take 25 mg by mouth daily as needed for allergies.   diphenhydrAMINE (SOMINEX) 25 MG tablet Take by mouth.   ezetimibe (ZETIA) 10 MG tablet Take 10 mg by mouth daily.   famotidine (PEPCID) 20 MG tablet Take by mouth.   gabapentin (NEURONTIN) 300 MG capsule Take 300 mg by mouth 3 (three) times daily.   glipiZIDE (GLUCOTROL XL) 10 MG 24 hr tablet Take 1 tablet (10 mg total) by mouth daily with breakfast.   glucose blood (ONETOUCH VERIO) test strip Use as instructed to check blood glucose 4 times daily.   glucose blood test strip 1 each by Other route 4 (four) times daily. Use as instructed 4 x daily. E11.65 True Track   hydrochlorothiazide (HYDRODIURIL) 25 MG tablet Take 25 mg by mouth daily.   ibuprofen (ADVIL) 800 MG tablet Take  800 mg by mouth 2 (two) times daily as needed.   Ibuprofen-diphenhydrAMINE Cit 200-38 MG TABS Take 1 tablet by mouth at bedtime as needed (sleep).   insulin aspart (FIASP FLEXTOUCH) 100 UNIT/ML FlexTouch Pen Inject 10-16 Units into the skin 3 (three) times daily with meals.   insulin detemir (LEVEMIR FLEXTOUCH) 100 UNIT/ML FlexPen Inject 60 Units into the skin at bedtime.   Lancet Devices (SIMPLE DIAGNOSTICS LANCING DEV) MISC Use 1 device As directed To test your blood sugar   LINZESS 145 MCG CAPS capsule Take 145 mcg by mouth daily.   lisinopril (ZESTRIL) 40 MG tablet Take 40 mg by mouth daily.   metFORMIN (GLUCOPHAGE) 500 MG tablet TAKE ONE TABLET BY MOUTH TWICE DAILY. TAKE WITH A MEAL. (Patient taking differently: Take 500 mg by mouth 2 (two) times daily with a meal.)   metoCLOPramide (REGLAN) 5 MG tablet Take 5 mg by mouth 3 (three) times daily.   omeprazole (PRILOSEC) 20 MG capsule Take 2 capsules (40 mg total) by mouth daily.   ondansetron (ZOFRAN) 8 MG tablet Take 8 mg by mouth every 6 (six) hours as needed.   ondansetron (ZOFRAN-ODT) 4 MG disintegrating tablet Take 4 mg by mouth every 8 (eight) hours as needed.   OneTouch Delica Lancets 69C MISC Use to check blood glucose 4 times daily.   polyethylene glycol (MIRALAX / GLYCOLAX) 17 g packet 1 packet DAILY (route: oral)   Polyethylene Glycol 400 0.25 % SOLN Place 1 drop into both eyes every 6 (six) hours as needed (dry eyes).    polyethylene glycol powder (GLYCOLAX/MIRALAX) 17 GM/SCOOP powder Take 17 g by mouth daily.   spironolactone (ALDACTONE) 25 MG tablet Take 25 mg by mouth 2 (two) times daily.   sucralfate (CARAFATE) 1 GM/10ML suspension Take 10 mLs (1 g total) by mouth 4 (four) times daily -  with meals and at bedtime.   ULTICARE SHORT PEN NEEDLES 31G X 8 MM MISC USE FOUR TIMES DAILY   [DISCONTINUED] insulin aspart (FIASP FLEXTOUCH) 100 UNIT/ML FlexTouch Pen Inject 7-13 Units into the skin 3 (three) times daily with meals.   No  facility-administered encounter medications on file as of 12/16/2021.    ALLERGIES: No Known Allergies  VACCINATION STATUS: Immunization History  Administered Date(s) Administered   Influenza-Unspecified 08/28/2018   Moderna Sars-Covid-2 Vaccination  09/21/2019, 10/19/2019    Diabetes She presents for her follow-up diabetic visit. She has type 2 diabetes mellitus. Onset time: she was diagnosed at approximate age of 23 years. Her disease course has been worsening. There are no hypoglycemic associated symptoms. Pertinent negatives for hypoglycemia include no headaches, pallor or seizures. Associated symptoms include blurred vision, foot paresthesias, polydipsia and polyuria. Pertinent negatives for diabetes include no chest pain and no polyphagia. There are no hypoglycemic complications. Symptoms are stable. Diabetic complications include nephropathy and peripheral neuropathy. Risk factors for coronary artery disease include diabetes mellitus, dyslipidemia, family history, hypertension, obesity, sedentary lifestyle, tobacco exposure and post-menopausal. Current diabetic treatment includes intensive insulin program and oral agent (dual therapy). She is compliant with treatment some of the time (caregiver reports she is not consistent with her short-acting insulin). Her weight is fluctuating minimally. She is following a generally unhealthy (still consumes sodas (has cut back)) diet. When asked about meal planning, she reported none. She has not had a previous visit with a dietitian. She never participates in exercise. Her home blood glucose trend is increasing steadily. Her breakfast blood glucose range is generally >200 mg/dl. Her lunch blood glucose range is generally >200 mg/dl. Her dinner blood glucose range is generally >200 mg/dl. Her bedtime blood glucose range is generally >200 mg/dl. Her overall blood glucose range is >200 mg/dl. (She presents today, accompanied by caregiver/family, with her meter  and logs showing persistent hyperglycemia.  She was not due for another A1c today.  She admits she is still drinking soda and coffee with creamer and does not eat on any routine.  Her caregiver tells me she is not consistent with taking her short acting insulin.  She still has not heard anything about her CGM. ) An ACE inhibitor/angiotensin II receptor blocker is being taken. She does not see a podiatrist.Eye exam is not current.  Hypertension This is a chronic problem. The current episode started more than 1 year ago. The problem has been waxing and waning since onset. The problem is controlled. Associated symptoms include blurred vision. Pertinent negatives include no chest pain, headaches, palpitations or shortness of breath. Risk factors for coronary artery disease include diabetes mellitus, obesity, sedentary lifestyle, smoking/tobacco exposure, family history, dyslipidemia and stress. Past treatments include ACE inhibitors, calcium channel blockers, diuretics and direct vasodilators. The current treatment provides moderate improvement. Compliance problems include psychosocial issues and diet.  Hypertensive end-organ damage includes kidney disease. Identifiable causes of hypertension include chronic renal disease.    Review of systems  Constitutional: + minimally fluctuating body weight,  current Body mass index is 31.07 kg/m. , + fatigue, no subjective hyperthermia, no subjective hypothermia Eyes: + blurry vision, no xerophthalmia ENT: no sore throat, no nodules palpated in throat, no dysphagia/odynophagia, no hoarseness Cardiovascular: no chest pain, no shortness of breath, no palpitations, no leg swelling Respiratory: no cough, no shortness of breath Gastrointestinal: no nausea/vomiting/diarrhea Musculoskeletal: no muscle/joint aches, complaining of left leg pain (has history of lower back problems)- walks with cane Skin: no rashes, no hyperemia, Neurological: no tremors, no dizziness, +  numbness/tingling/cold sensation in bilateral feet. Psychiatric: no depression, no anxiety   Objective:    BP 110/77   Pulse 99   Ht 5' 4" (1.626 m)   Wt 181 lb (82.1 kg)   BMI 31.07 kg/m   Wt Readings from Last 3 Encounters:  12/16/21 181 lb (82.1 kg)  11/06/21 182 lb (82.6 kg)  07/28/21 180 lb (81.6 kg)    BP Readings from  Last 3 Encounters:  12/16/21 110/77  11/06/21 119/81  07/28/21 112/73    Physical Exam- Limited  Constitutional:  Body mass index is 31.07 kg/m. , not in acute distress, normal state of mind Eyes:  EOMI, no exophthalmos Neck: Supple Cardiovascular: RRR, no murmurs, rubs, or gallops, no edema Respiratory: Adequate breathing efforts, no crackles, rales, rhonchi, or wheezing Musculoskeletal: no gross deformities, strength intact in all four extremities, no gross restriction of joint movements, walks with cane Skin:  no rashes, no hyperemia Neurological: no tremor with outstretched hands  Diabetic Foot Exam - Simple   Simple Foot Form Visual Inspection No deformities, no ulcerations, no other skin breakdown bilaterally: Yes Sensation Testing See comments: Yes Pulse Check Posterior Tibialis and Dorsalis pulse intact bilaterally: Yes Comments No sensation to monofilament tool bilaterally     CMP ( most recent) CMP     Component Value Date/Time   NA 134 (L) 04/01/2021 1400   NA 135 (A) 11/15/2020 0000   K 3.0 (L) 04/01/2021 1400   CL 92 (L) 04/01/2021 1400   CO2 32 04/01/2021 1400   GLUCOSE 152 (H) 04/01/2021 1400   BUN 33 (H) 04/01/2021 1400   BUN 16 11/15/2020 0000   CREATININE 1.51 (H) 04/01/2021 1400   CREATININE 0.56 09/22/2017 1130   CALCIUM 9.6 04/01/2021 1400   PROT 7.7 04/01/2021 1400   ALBUMIN 4.5 04/01/2021 1400   AST 17 04/01/2021 1400   ALT 22 04/01/2021 1400   ALKPHOS 51 04/01/2021 1400   BILITOT 1.1 04/01/2021 1400   GFRNONAA 39 (L) 04/01/2021 1400   GFRNONAA 104 09/22/2017 1130   GFRAA 64 11/15/2020 0000   GFRAA 120  09/22/2017 1130    Diabetic Labs (most recent): Lab Results  Component Value Date   HGBA1C 12.3 11/06/2021   HGBA1C 10.1 (A) 07/28/2021   HGBA1C 9.1 (H) 02/05/2021   MICROALBUR 10 07/28/2021   MICROALBUR 10 08/20/2020   MICROALBUR 1.2 12/01/2018     Lipid Panel     Component Value Date/Time   CHOL 94 12/01/2018 0000   TRIG 68 05/14/2020 0000   HDL 42 05/14/2020 0000   LDLCALC 114 05/14/2020 0000      Assessment & Plan:   1) Uncontrolled type 2 diabetes mellitus with hyperglycemia (HCC)  - Kari Miles has currently uncontrolled symptomatic type 2 DM since  61 years of age.  She presents today, accompanied by caregiver/family, with her meter and logs showing persistent hyperglycemia.  She was not due for another A1c today.  She admits she is still drinking soda and coffee with creamer and does not eat on any routine.  Her caregiver tells me she is not consistent with taking her short acting insulin.  She still has not heard anything about her CGM.  her diabetes is complicated by noncompliance/nonadherence, chronic heavy smoking and Kari Miles remains at a high risk for more acute and chronic complications which include CAD, CVA, CKD, retinopathy, and neuropathy. These are all discussed in detail with the patient.  - Nutritional counseling repeated at each appointment due to patients tendency to fall back in to old habits.  - The patient admits there is a room for improvement in their diet and drink choices. -  Suggestion is made for the patient to avoid simple carbohydrates from their diet including Cakes, Sweet Desserts / Pastries, Ice Cream, Soda (diet and regular), Sweet Tea, Candies, Chips, Cookies, Sweet Pastries, Store Bought Juices, Alcohol in Excess of 1-2 drinks a day, Artificial  Sweeteners, Coffee Creamer, and "Sugar-free" Products. This will help patient to have stable blood glucose profile and potentially avoid unintended weight gain.   - I encouraged the  patient to switch to unprocessed or minimally processed complex starch and increased protein intake (animal or plant source), fruits, and vegetables.   - Patient is advised to stick to a routine mealtimes to eat 3 meals a day and avoid unnecessary snacks (to snack only to correct hypoglycemia).  - I have approached her with the following individualized plan to manage diabetes and patient agrees:   -Given her current and persisting glycemic burden, she will continue to require intensive treatment with multiple daily injections of insulin and oral therapies in order for her to achieve control of diabetes to target.  -She is advised to continue her Levemir 60 units SQ nightly and adjust her Fiasp to 10-16 units TID with meals if glucose is above 90 and she is eating (Specific instructions on how to titrate insulin dosage based on glucose readings given to patient in writing).  She is also advised to be more consistent with taking her injections.  She can continue Metformin 500 mg po daily with breakfast (kidney function stable) and Glipizide 10 mg XL daily with breakfast.    -She is encouraged to start consistently monitoring glucose 4 times daily, before meals and before bed, and to call the clinic if she has readings less than 70 or above 300 for 3 tests in a row.  I gave the patient the number to [employer] to call today since she does not answer unknown numbers so she can receive her CGM which is greatly needed.  - Patient is warned not to take insulin without proper monitoring per orders. -Adjustment parameters are given for hypo and hyperglycemia in writing.  -- she not a suitable candidate for incretin therapy given her chronic heavy smoking.    - Patient specific target  A1c;  LDL, HDL, Triglycerides, and  Waist Circumference were discussed in detail.  2) BP/HTN:  Her blood pressure is controlled to target.  She is advised to continue Norvasc 5 mg po daily, HCTZ 25 mg po daily, Spironolactone  25 mg po twice daily, Clonidine 0.1 mg po twice daily and Lisinopril 40 mg po daily.    3) Lipids/HPL:  Her most recent lipid panel from 07/18/21 shows controlled LDL of 21.  She is advised to continue her dose of Lipitor to 40 mg po daily at bedtime.  Side effects and precautions discussed with her.  4)  Weight/Diet: Her Body mass index is 31.07 kg/m.- clearly complicating her diabetes care.  She is a candidate for modest weight loss.  CDE Consult will be initiated , exercise, and detailed carbohydrates information provided.  She has missed her appointments with Kari Miles, RDE due to transportation issues.    5) Chronic Care/Health Maintenance: -she is on ACEI medications and statin is encouraged to continue to follow up with Ophthalmology, Dentist, Podiatrist at least yearly or according to recommendations, and advised to quit smoking. I have recommended yearly flu vaccine and pneumonia vaccination at least every 5 years; moderate intensity exercise for up to 150 minutes weekly; and  sleep for at least 7 hours a day.  -The patient was counseled on the dangers of tobacco use, and was advised to quit.  Reviewed strategies to maximize success, including removing cigarettes and smoking materials from environment.  - I advised patient to maintain close follow up with Carrolyn Meiers, MD for  primary care needs.      I spent 40 minutes in the care of the patient today including review of labs from [facility], Lipids, Thyroid Function, Hematology (current and previous including abstractions from other facilities); face-to-face time discussing  her blood glucose readings/logs, discussing hypoglycemia and hyperglycemia episodes and symptoms, medications doses, her options of short and long term treatment based on the latest standards of care / guidelines;  discussion about incorporating lifestyle medicine;  and documenting the encounter.    Please refer to Patient Instructions for Blood Glucose  Monitoring and Insulin/Medications Dosing Guide"  in media tab for additional information. Please  also refer to " Patient Self Inventory" in the Media  tab for reviewed elements of pertinent patient history.  Kari Miles participated in the discussions, expressed understanding, and voiced agreement with the above plans.  All questions were answered to her satisfaction. she is encouraged to contact clinic should she have any questions or concerns prior to her return visit.    Follow up plan: Return in about 1 month (around 01/15/2022) for Diabetes F/U, Bring meter and logs.   Rayetta Pigg, [facility] [address] [place], [place] [address] Phone: [phone] Fax: [phone]  12/16/2021, 11:50 AM

## 2021-12-18 ENCOUNTER — Other Ambulatory Visit: Payer: Self-pay | Admitting: Nurse Practitioner

## 2021-12-24 DIAGNOSIS — E1165 Type 2 diabetes mellitus with hyperglycemia: Secondary | ICD-10-CM | POA: Diagnosis not present

## 2022-01-04 DIAGNOSIS — I10 Essential (primary) hypertension: Secondary | ICD-10-CM | POA: Diagnosis not present

## 2022-01-04 DIAGNOSIS — E1140 Type 2 diabetes mellitus with diabetic neuropathy, unspecified: Secondary | ICD-10-CM | POA: Diagnosis not present

## 2022-01-06 ENCOUNTER — Ambulatory Visit: Payer: Medicaid Other | Admitting: Orthopaedic Surgery

## 2022-01-07 ENCOUNTER — Ambulatory Visit: Payer: Medicaid Other | Admitting: Orthopaedic Surgery

## 2022-01-13 ENCOUNTER — Ambulatory Visit (INDEPENDENT_AMBULATORY_CARE_PROVIDER_SITE_OTHER): Payer: Medicare Other

## 2022-01-13 ENCOUNTER — Ambulatory Visit (INDEPENDENT_AMBULATORY_CARE_PROVIDER_SITE_OTHER): Payer: Medicare Other | Admitting: Orthopaedic Surgery

## 2022-01-13 ENCOUNTER — Encounter: Payer: Self-pay | Admitting: Orthopaedic Surgery

## 2022-01-13 ENCOUNTER — Ambulatory Visit: Payer: Medicare Other

## 2022-01-13 DIAGNOSIS — M25562 Pain in left knee: Secondary | ICD-10-CM

## 2022-01-13 DIAGNOSIS — G8929 Other chronic pain: Secondary | ICD-10-CM

## 2022-01-13 DIAGNOSIS — M25561 Pain in right knee: Secondary | ICD-10-CM

## 2022-01-13 NOTE — Progress Notes (Signed)
Subjective:    Patient ID: Kari Miles, female    DOB: [birth year], 61 y.o.   MRN: [id number]  HPI She has bilateral knee pain. I saw her in January 2018 for this and have not seen her since.  She has pain in both knees, more on the left. She has swelling at times, no giving way, no trauma, no redness, no numbness.  Nothing seems to help. She has tried ice, Advil, heat, rest.  She uses a cane.     Review of Systems  Constitutional:  Positive for activity change.  Musculoskeletal:  Positive for arthralgias, back pain, gait problem, joint swelling and myalgias.  All other systems reviewed and are negative. For Review of Systems, all other systems reviewed and are negative.  The following is a summary of the past history medically, past history surgically, known current medicines, social history and family history.  This information is gathered electronically by the computer from prior information and documentation.  I review this each visit and have found including this information at this point in the chart is beneficial and informative.   Past Medical History:  Diagnosis Date   Arthritis    Carpal tunnel syndrome    Chronic back pain    Diabetes mellitus without complication ([place])    Hypertension    Tendonitis     Past Surgical History:  Procedure Laterality Date   ABDOMINAL HYSTERECTOMY     CESAREAN SECTION     ORTHOPEDIC SURGERY      Current Outpatient Medications on File Prior to Visit  Medication Sig Dispense Refill   albuterol (PROVENTIL HFA;VENTOLIN HFA) 108 (90 Base) MCG/ACT inhaler Inhale 2 puffs into the lungs every 6 (six) hours as needed for wheezing or shortness of breath.     Alcohol Swabs 70 % PADS Use as directed to clean skin prior to injections tid and checking blood glucose tid 200 each 5   amLODipine (NORVASC) 5 MG tablet Take 5 mg by mouth daily.     aspirin EC 81 MG tablet Take 81 mg by mouth daily.     atorvastatin (LIPITOR) 40 MG tablet TAKE ONE  TABLET BY MOUTH ONCE DAILY 90 tablet 1   blood glucose meter kit and supplies KIT Dispense based on patient and insurance preference. Use four times daily as directed. 1 each 0   Blood Glucose Monitoring Suppl ([place]) w/Device KIT Use to check blood glucose 4 times daily. 1 kit 0   Blood Glucose Monitoring Suppl (ONETOUCH VERIO REFLECT) w/Device KIT by Does not apply route in the morning, at noon, in the evening, and at bedtime.     ezetimibe (ZETIA) 10 MG tablet Take 10 mg by mouth daily.     gabapentin (NEURONTIN) 300 MG capsule Take 300 mg by mouth 3 (three) times daily.     glucose blood (ONETOUCH VERIO) test strip Use as instructed to check blood glucose 4 times daily. 400 each 1   glucose blood test strip 1 each by Other route 4 (four) times daily. Use as instructed 4 x daily. E11.65 True Track 150 each 5   hydrochlorothiazide (HYDRODIURIL) 25 MG tablet Take 25 mg by mouth daily.     ibuprofen (ADVIL) 800 MG tablet Take 800 mg by mouth 2 (two) times daily as needed.     insulin aspart (FIASP FLEXTOUCH) 100 UNIT/ML FlexTouch Pen Inject 10-16 Units into the skin 3 (three) times daily with meals. 15 mL 3   insulin detemir (  LEVEMIR FLEXPEN) 100 UNIT/ML FlexPen Inject 60 Units into the skin at bedtime. 30 mL 1   Lancet Devices (SIMPLE DIAGNOSTICS LANCING DEV) MISC Use 1 device As directed To test your blood sugar     lisinopril (ZESTRIL) 40 MG tablet Take 40 mg by mouth daily.     metFORMIN (GLUCOPHAGE) 500 MG tablet TAKE ONE TABLET BY MOUTH TWICE DAILY. TAKE WITH A MEAL. (Patient taking differently: Take 500 mg by mouth 2 (two) times daily with a meal.) 60 tablet 2   spironolactone (ALDACTONE) 25 MG tablet Take 25 mg by mouth 2 (two) times daily.     ULTICARE SHORT PEN NEEDLES 31G X 8 MM MISC USE FOUR TIMES DAILY 100 each 2   omeprazole (PRILOSEC) 20 MG capsule Take 2 capsules (40 mg total) by mouth daily. 60 capsule 0   No current facility-administered medications on file  prior to visit.    Social History   Socioeconomic History   Marital status: Widowed    Spouse name: Not on file   Number of children: Not on file   Years of education: Not on file   Highest education level: Not on file  Occupational History   Not on file  Tobacco Use   Smoking status: Every Day    Packs/day: 0.10    Years: 30.00    Total pack years: 3.00    Types: Cigarettes   Smokeless tobacco: Never  Vaping Use   Vaping Use: Never used  Substance and Sexual Activity   Alcohol use: No   Drug use: No   Sexual activity: Not on file  Other Topics Concern   Not on file  Social History Narrative   Not on file   Social Determinants of Health   Financial Resource Strain: Not on file  Food Insecurity: Not on file  Transportation Needs: Not on file  Physical Activity: Not on file  Stress: Not on file  Social Connections: Not on file  Intimate Partner Violence: Not on file    Family History  Problem Relation Age of Onset   Seizures Mother    Heart disease Father    Cancer Sister    Heart attack Sister    Colon cancer Neg Hx     There were no vitals taken for this visit.  There is no height or weight on file to calculate BMI.      Objective:   Physical Exam Vitals and nursing note reviewed. Exam conducted with a chaperone present.  Constitutional:      Appearance: She is well-developed.  HENT:     Head: Normocephalic and atraumatic.  Eyes:     Conjunctiva/sclera: Conjunctivae normal.     Pupils: Pupils are equal, round, and reactive to light.  Cardiovascular:     Rate and Rhythm: Normal rate and regular rhythm.  Pulmonary:     Effort: Pulmonary effort is normal.  Abdominal:     Palpations: Abdomen is soft.  Musculoskeletal:     Cervical back: Normal range of motion and neck supple.       Legs:  Skin:    General: Skin is warm and dry.  Neurological:     Mental Status: She is alert and oriented to person, place, and time.     Cranial Nerves: No  cranial nerve deficit.     Motor: No abnormal muscle tone.     Coordination: Coordination normal.     Deep Tendon Reflexes: Reflexes are normal and symmetric. Reflexes normal.  Psychiatric:  Behavior: Behavior normal.        Thought Content: Thought content normal.        Judgment: Judgment normal.   X-rays were done of both knees, reported separately.        Assessment & Plan:   Encounter Diagnosis  Name Primary?   Chronic pain of both knees Yes   PROCEDURE NOTE:  The patient requests injections of the left knee , verbal consent was obtained.  The left knee was prepped appropriately after time out was performed.   Sterile technique was observed and injection of 1 cc of DepoMedrol 40 mg with several cc's of plain xylocaine. Anesthesia was provided by ethyl chloride and a 20-gauge needle was used to inject the knee area. The injection was tolerated well.  A band aid dressing was applied.  The patient was advised to apply ice later today and tomorrow to the injection sight as needed.  PROCEDURE NOTE:  The patient requests injections of the right knee , verbal consent was obtained.  The right knee was prepped appropriately after time out was performed.   Sterile technique was observed and injection of 1 cc of DepoMedrol 64m with several cc's of plain xylocaine. Anesthesia was provided by ethyl chloride and a 20-gauge needle was used to inject the knee area. The injection was tolerated well.  A band aid dressing was applied.  The patient was advised to apply ice later today and tomorrow to the injection sight as needed.  Return in three weeks.  Call if any problem.  Precautions discussed.  Electronically Signed WSanjuana Kava MD 7/18/20233:27 PM

## 2022-01-15 ENCOUNTER — Ambulatory Visit: Payer: Medicare Other | Admitting: Nurse Practitioner

## 2022-01-21 ENCOUNTER — Encounter: Payer: Self-pay | Admitting: Nurse Practitioner

## 2022-01-21 ENCOUNTER — Telehealth: Payer: Self-pay | Admitting: Nurse Practitioner

## 2022-01-21 ENCOUNTER — Ambulatory Visit (INDEPENDENT_AMBULATORY_CARE_PROVIDER_SITE_OTHER): Payer: Medicare Other | Admitting: Nurse Practitioner

## 2022-01-21 VITALS — BP 131/84 | HR 80 | Ht 64.0 in | Wt 179.0 lb

## 2022-01-21 DIAGNOSIS — E782 Mixed hyperlipidemia: Secondary | ICD-10-CM | POA: Diagnosis not present

## 2022-01-21 DIAGNOSIS — I10 Essential (primary) hypertension: Secondary | ICD-10-CM

## 2022-01-21 DIAGNOSIS — E1165 Type 2 diabetes mellitus with hyperglycemia: Secondary | ICD-10-CM

## 2022-01-21 MED ORDER — ONETOUCH VERIO VI STRP: ORAL_STRIP | 1 refills | Status: AC

## 2022-01-21 MED ORDER — ONETOUCH VERIO FLEX SYSTEM W/DEVICE KIT: PACK | 0 refills | Status: AC

## 2022-01-21 MED ORDER — LEVEMIR FLEXPEN 100 UNIT/ML ~~LOC~~ SOPN
70.0000 [IU] | PEN_INJECTOR | Freq: Every day | SUBCUTANEOUS | 3 refills | Status: DC
Start: 2022-01-21 — End: 2022-07-30

## 2022-01-21 MED ORDER — LANCETS MISC: 1 refills | Status: AC

## 2022-01-21 NOTE — Progress Notes (Signed)
Endocrinology follow-up note       01/21/2022, 10:57 AM   Subjective:    Patient ID: Kari Miles, female    DOB: [birth year].  Kari Miles is being seen in follow-up for management of chronically uncontrolled type 2 diabetes.  She also has hypertension, hyperlipidemia on treatment.    PMD:  Carrolyn Meiers, MD.   Past Medical History:  Diagnosis Date   Arthritis    Carpal tunnel syndrome    Chronic back pain    Diabetes mellitus without complication ([place])    Hypertension    Tendonitis    Past Surgical History:  Procedure Laterality Date   ABDOMINAL HYSTERECTOMY     CESAREAN SECTION     ORTHOPEDIC SURGERY     Social History   Socioeconomic History   Marital status: Widowed    Spouse name: Not on file   Number of children: Not on file   Years of education: Not on file   Highest education level: Not on file  Occupational History   Not on file  Tobacco Use   Smoking status: Every Day    Packs/day: 0.10    Years: 30.00    Total pack years: 3.00    Types: Cigarettes   Smokeless tobacco: Never  Vaping Use   Vaping Use: Never used  Substance and Sexual Activity   Alcohol use: No   Drug use: No   Sexual activity: Not on file  Other Topics Concern   Not on file  Social History Narrative   Not on file   Social Determinants of Health   Financial Resource Strain: Not on file  Food Insecurity: Not on file  Transportation Needs: Not on file  Physical Activity: Not on file  Stress: Not on file  Social Connections: Not on file    Family History  Problem Relation Age of Onset   Seizures Mother    Heart disease Father    Cancer Sister    Heart attack Sister    Colon cancer Neg Hx    Outpatient Encounter Medications as of 01/21/2022  Medication Sig   albuterol (PROVENTIL HFA;VENTOLIN HFA) 108 (90 Base) MCG/ACT inhaler Inhale 2 puffs into the lungs every 6 (six) hours as needed for  wheezing or shortness of breath.   Alcohol Swabs 70 % PADS Use as directed to clean skin prior to injections tid and checking blood glucose tid   amLODipine (NORVASC) 5 MG tablet Take 5 mg by mouth daily.   aspirin EC 81 MG tablet Take 81 mg by mouth daily.   atorvastatin (LIPITOR) 40 MG tablet TAKE ONE TABLET BY MOUTH ONCE DAILY   blood glucose meter kit and supplies KIT Dispense based on patient and insurance preference. Use four times daily as directed.   Blood Glucose Monitoring Suppl ([place]) w/Device KIT Use to check blood glucose 4 times daily.   Blood Glucose Monitoring Suppl (ONETOUCH VERIO REFLECT) w/Device KIT by Does not apply route in the morning, at noon, in the evening, and at bedtime.   ezetimibe (ZETIA) 10 MG tablet Take 10 mg by mouth daily.   gabapentin (NEURONTIN) 300 MG capsule Take 300 mg by mouth 3 (three) times  daily.   glucose blood (ONETOUCH VERIO) test strip Use as instructed to check blood glucose 4 times daily.   glucose blood test strip 1 each by Other route 4 (four) times daily. Use as instructed 4 x daily. E11.65 True Track   hydrochlorothiazide (HYDRODIURIL) 25 MG tablet Take 25 mg by mouth daily.   ibuprofen (ADVIL) 800 MG tablet Take 800 mg by mouth 2 (two) times daily as needed.   insulin aspart (FIASP FLEXTOUCH) 100 UNIT/ML FlexTouch Pen Inject 10-16 Units into the skin 3 (three) times daily with meals.   insulin detemir (LEVEMIR FLEXPEN) 100 UNIT/ML FlexPen Inject 70 Units into the skin at bedtime.   Lancet Devices (SIMPLE DIAGNOSTICS LANCING DEV) MISC Use 1 device As directed To test your blood sugar   lisinopril (ZESTRIL) 40 MG tablet Take 40 mg by mouth daily.   metFORMIN (GLUCOPHAGE) 500 MG tablet TAKE ONE TABLET BY MOUTH TWICE DAILY. TAKE WITH A MEAL. (Patient taking differently: Take 500 mg by mouth 2 (two) times daily with a meal.)   omeprazole (PRILOSEC) 20 MG capsule Take 2 capsules (40 mg total) by mouth daily.   spironolactone  (ALDACTONE) 25 MG tablet Take 25 mg by mouth 2 (two) times daily.   ULTICARE SHORT PEN NEEDLES 31G X 8 MM MISC USE FOUR TIMES DAILY   [DISCONTINUED] insulin detemir (LEVEMIR FLEXPEN) 100 UNIT/ML FlexPen Inject 60 Units into the skin at bedtime.   No facility-administered encounter medications on file as of 01/21/2022.    ALLERGIES: No Known Allergies  VACCINATION STATUS: Immunization History  Administered Date(s) Administered   Influenza-Unspecified 08/28/2018   Moderna Sars-Covid-2 Vaccination 09/21/2019, 10/19/2019    Diabetes She presents for her follow-up diabetic visit. She has type 2 diabetes mellitus. Onset time: she was diagnosed at approximate age of 61 years. Her disease course has been fluctuating. There are no hypoglycemic associated symptoms. Pertinent negatives for hypoglycemia include no headaches, pallor or seizures. Associated symptoms include blurred vision, foot paresthesias, polydipsia, polyuria and weight loss. Pertinent negatives for diabetes include no chest pain and no polyphagia. There are no hypoglycemic complications. Symptoms are stable. Diabetic complications include nephropathy and peripheral neuropathy. Risk factors for coronary artery disease include diabetes mellitus, dyslipidemia, family history, hypertension, obesity, sedentary lifestyle, tobacco exposure and post-menopausal. Current diabetic treatment includes intensive insulin program and oral agent (dual therapy). She is compliant with treatment most of the time (caregiver reports she is getting better at taking her short acting insulin). Her weight is fluctuating minimally. She is following a generally unhealthy (still consumes sodas (has cut back)) diet. When asked about meal planning, she reported none. She has not had a previous visit with a dietitian. She never participates in exercise. Her home blood glucose trend is fluctuating minimally. Her breakfast blood glucose range is generally >200 mg/dl. Her  lunch blood glucose range is generally >200 mg/dl. Her dinner blood glucose range is generally >200 mg/dl. Her bedtime blood glucose range is generally >200 mg/dl. Her overall blood glucose range is >200 mg/dl. (She presents today, accompanied by caregiver/family, with her logs showing persistent hyperglycemia.  She was not due for another A1c today.  She admits she is still drinking soda and coffee with creamer and does not eat on any routine.  Her caregiver tells me she is getting more consistent with taking her short acting insulin.  She still has not heard anything about her CGM. ) An ACE inhibitor/angiotensin II receptor blocker is being taken. She does not see a podiatrist.Eye exam  is not current.  Hypertension This is a chronic problem. The current episode started more than 1 year ago. The problem has been waxing and waning since onset. The problem is controlled. Associated symptoms include blurred vision. Pertinent negatives include no chest pain, headaches, palpitations or shortness of breath. Risk factors for coronary artery disease include diabetes mellitus, obesity, sedentary lifestyle, smoking/tobacco exposure, family history, dyslipidemia and stress. Past treatments include ACE inhibitors, calcium channel blockers, diuretics and direct vasodilators. The current treatment provides moderate improvement. Compliance problems include psychosocial issues and diet.  Hypertensive end-organ damage includes kidney disease. Identifiable causes of hypertension include chronic renal disease.    Review of systems  Constitutional: + minimally fluctuating body weight,  current Body mass index is 30.73 kg/m. , + fatigue, no subjective hyperthermia, no subjective hypothermia Eyes: + blurry vision, no xerophthalmia ENT: no sore throat, no nodules palpated in throat, no dysphagia/odynophagia, no hoarseness Cardiovascular: no chest pain, no shortness of breath, no palpitations, no leg swelling Respiratory: no  cough, no shortness of breath Gastrointestinal: no nausea/vomiting/diarrhea Musculoskeletal: no muscle/joint aches, complaining of left leg pain (has history of lower back problems)- walks with cane Skin: no rashes, no hyperemia, Neurological: no tremors, no dizziness, + numbness/tingling/cold sensation in bilateral feet. Psychiatric: no depression, no anxiety   Objective:    BP 131/84   Pulse 80   Ht _0  (1.626 m)   Wt 179 lb (81.2 kg)   BMI 30.73 kg/m   Wt Readings from Last 3 Encounters:  01/21/22 179 lb (81.2 kg)  12/16/21 181 lb (82.1 kg)  11/06/21 182 lb (82.6 kg)    BP Readings from Last 3 Encounters:  01/21/22 131/84  12/16/21 110/77  11/06/21 119/81    Physical Exam- Limited  Constitutional:  Body mass index is 30.73 kg/m. , not in acute distress, normal state of mind Eyes:  EOMI, no exophthalmos Neck: Supple Cardiovascular: RRR, no murmurs, rubs, or gallops, no edema Respiratory: Adequate breathing efforts, no crackles, rales, rhonchi, or wheezing Musculoskeletal: no gross deformities, strength intact in all four extremities, no gross restriction of joint movements, walks with cane Skin:  no rashes, no hyperemia Neurological: no tremor with outstretched hands  Diabetic Foot Exam - Simple   Simple Foot Form Visual Inspection No deformities, no ulcerations, no other skin breakdown bilaterally: Yes Sensation Testing See comments: Yes Pulse Check Posterior Tibialis and Dorsalis pulse intact bilaterally: Yes Comments No sensation to monofilament tool bilaterally, left great toe pain with mild onychomycosis     CMP ( most recent) CMP     Component Value Date/Time   NA 134 (L) 04/01/2021 1400   NA 135 (A) 11/15/2020 0000   K 3.0 (L) 04/01/2021 1400   CL 92 (L) 04/01/2021 1400   CO2 32 04/01/2021 1400   GLUCOSE 152 (H) 04/01/2021 1400   BUN 33 (H) 04/01/2021 1400   BUN 16 11/15/2020 0000   CREATININE 1.51 (H) 04/01/2021 1400   CREATININE 0.56  09/22/2017 1130   CALCIUM 9.6 04/01/2021 1400   PROT 7.7 04/01/2021 1400   ALBUMIN 4.5 04/01/2021 1400   AST 17 04/01/2021 1400   ALT 22 04/01/2021 1400   ALKPHOS 51 04/01/2021 1400   BILITOT 1.1 04/01/2021 1400   GFRNONAA 39 (L) 04/01/2021 1400   GFRNONAA 104 09/22/2017 1130   GFRAA 64 11/15/2020 0000   GFRAA 120 09/22/2017 1130    Diabetic Labs (most recent): Lab Results  Component Value Date   HGBA1C 12.3 11/06/2021   HGBA1C  10.1 (A) 07/28/2021   HGBA1C 9.1 (H) 02/05/2021   MICROALBUR 10 07/28/2021   MICROALBUR 10 08/20/2020   MICROALBUR 1.2 12/01/2018     Lipid Panel     Component Value Date/Time   CHOL 94 12/01/2018 0000   TRIG 68 05/14/2020 0000   HDL 42 05/14/2020 0000   LDLCALC 114 05/14/2020 0000      Assessment & Plan:   1) Uncontrolled type 2 diabetes mellitus with hyperglycemia ([place])  - Z[place]man has currently uncontrolled symptomatic type 2 DM since  61 years of age.  She presents today, accompanied by caregiver/family, with her logs showing persistent hyperglycemia.  She was not due for another A1c today.  She admits she is still drinking soda and coffee with creamer and does not eat on any routine.  Her caregiver tells me she is getting more consistent with taking her short acting insulin.  She still has not heard anything about her CGM.  her diabetes is complicated by noncompliance/nonadherence, chronic heavy smoking and CHABELY NORBY remains at a high risk for more acute and chronic complications which include CAD, CVA, CKD, retinopathy, and neuropathy. These are all discussed in detail with the patient.  - Nutritional counseling repeated at each appointment due to patients tendency to fall back in to old habits.  - The patient admits there is a room for improvement in their diet and drink choices. -  Suggestion is made for the patient to avoid simple carbohydrates from their diet including Cakes, Sweet Desserts / Pastries, Ice Cream, Soda (diet  and regular), Sweet Tea, Candies, Chips, Cookies, Sweet Pastries, Store Bought Juices, Alcohol in Excess of 1-2 drinks a day, Artificial Sweeteners, Coffee Creamer, and "Sugar-free" Products. This will help patient to have stable blood glucose profile and potentially avoid unintended weight gain.   - I encouraged the patient to switch to unprocessed or minimally processed complex starch and increased protein intake (animal or plant source), fruits, and vegetables.   - Patient is advised to stick to a routine mealtimes to eat 3 meals a day and avoid unnecessary snacks (to snack only to correct hypoglycemia).  - I have approached her with the following individualized plan to manage diabetes and patient agrees:   -Given her current and persisting glycemic burden, she will continue to require intensive treatment with multiple daily injections of insulin and oral therapies in order for her to achieve control of diabetes to target.  -She is advised to increase her Levemir 70 units SQ nightly and continue her Fiasp 10-16 units TID with meals if glucose is above 90 and she is eating (Specific instructions on how to titrate insulin dosage based on glucose readings given to patient in writing).  She is also advised to be more consistent with taking her injections.  She can continue Metformin 500 mg po daily with breakfast (kidney function stable) and Glipizide 10 mg XL daily with breakfast.  I advised her to limit soda intake to meal time only, in hopes we can continue to work and wean her off completely.  -She is encouraged to start consistently monitoring glucose 4 times daily, before meals and before bed, and to call the clinic if she has readings less than 70 or above 300 for 3 tests in a row.  I gave the patient the number to [employer] to call today since she does not answer unknown numbers so she can receive her CGM which is greatly needed.  I also reached out to the  rep.  - Patient is warned not to take  insulin without proper monitoring per orders. -Adjustment parameters are given for hypo and hyperglycemia in writing.  -- she not a suitable candidate for incretin therapy given her chronic heavy smoking.    - Patient specific target  A1c;  LDL, HDL, Triglycerides, and  Waist Circumference were discussed in detail.  2) BP/HTN:  Her blood pressure is controlled to target.  She is advised to continue Norvasc 5 mg po daily, HCTZ 25 mg po daily, Spironolactone 25 mg po twice daily, Clonidine 0.1 mg po twice daily and Lisinopril 40 mg po daily.    3) Lipids/HPL:  Her most recent lipid panel from 07/18/21 shows controlled LDL of 21.  She is advised to continue her dose of Lipitor to 40 mg po daily at bedtime.  Side effects and precautions discussed with her.  4)  Weight/Diet: Her Body mass index is 30.73 kg/m.- clearly complicating her diabetes care.  She is a candidate for modest weight loss.  CDE Consult will be initiated , exercise, and detailed carbohydrates information provided.  She has missed her appointments with Jearld Fenton, RDE due to transportation issues.    5) Chronic Care/Health Maintenance: -she is on ACEI medications and statin is encouraged to continue to follow up with Ophthalmology, Dentist, Podiatrist at least yearly or according to recommendations, and advised to quit smoking. I have recommended yearly flu vaccine and pneumonia vaccination at least every 5 years; moderate intensity exercise for up to 150 minutes weekly; and  sleep for at least 7 hours a day.  -The patient was counseled on the dangers of tobacco use, and was advised to quit.  Reviewed strategies to maximize success, including removing cigarettes and smoking materials from environment.  - I advised patient to maintain close follow up with Carrolyn Meiers, MD for primary care needs.     I spent 30 minutes in the care of the patient today including review of labs from [facility], Lipids, Thyroid Function,  Hematology (current and previous including abstractions from other facilities); face-to-face time discussing  her blood glucose readings/logs, discussing hypoglycemia and hyperglycemia episodes and symptoms, medications doses, her options of short and long term treatment based on the latest standards of care / guidelines;  discussion about incorporating lifestyle medicine;  and documenting the encounter. Risk reduction counseling performed per USPSTF guidelines to reduce obesity and cardiovascular risk factors.     Please refer to Patient Instructions for Blood Glucose Monitoring and Insulin/Medications Dosing Guide"  in media tab for additional information. Please  also refer to " Patient Self Inventory" in the Media  tab for reviewed elements of pertinent patient history.  Kari Miles participated in the discussions, expressed understanding, and voiced agreement with the above plans.  All questions were answered to her satisfaction. she is encouraged to contact clinic should she have any questions or concerns prior to her return visit.    Follow up plan: Return in about 3 months (around 04/23/2022) for Diabetes F/U with A1c in office, No previsit labs, Bring meter and logs.   Rayetta Pigg, [facility] [address] [place], [place] [address] Phone: [phone] Fax: [phone]  01/21/2022, 10:57 AM

## 2022-01-21 NOTE — Telephone Encounter (Signed)
Patient is requesting a new meter to be called in, with lancets and strips. The one she had before was one touch verio flex. Please send to Kari Miles

## 2022-01-21 NOTE — Telephone Encounter (Signed)
Rx sent 

## 2022-01-23 DIAGNOSIS — E1165 Type 2 diabetes mellitus with hyperglycemia: Secondary | ICD-10-CM | POA: Diagnosis not present

## 2022-01-26 DIAGNOSIS — E1165 Type 2 diabetes mellitus with hyperglycemia: Secondary | ICD-10-CM | POA: Diagnosis not present

## 2022-02-04 DIAGNOSIS — E1140 Type 2 diabetes mellitus with diabetic neuropathy, unspecified: Secondary | ICD-10-CM | POA: Diagnosis not present

## 2022-02-04 DIAGNOSIS — I10 Essential (primary) hypertension: Secondary | ICD-10-CM | POA: Diagnosis not present

## 2022-02-10 ENCOUNTER — Ambulatory Visit: Payer: Medicare Other | Admitting: Orthopaedic Surgery

## 2022-02-10 DIAGNOSIS — E119 Type 2 diabetes mellitus without complications: Secondary | ICD-10-CM | POA: Diagnosis not present

## 2022-02-10 DIAGNOSIS — Z7984 Long term (current) use of oral hypoglycemic drugs: Secondary | ICD-10-CM | POA: Diagnosis not present

## 2022-02-10 DIAGNOSIS — Z794 Long term (current) use of insulin: Secondary | ICD-10-CM | POA: Diagnosis not present

## 2022-02-10 DIAGNOSIS — H25813 Combined forms of age-related cataract, bilateral: Secondary | ICD-10-CM | POA: Diagnosis not present

## 2022-02-10 LAB — HM DIABETES EYE EXAM

## 2022-02-12 DIAGNOSIS — B351 Tinea unguium: Secondary | ICD-10-CM | POA: Diagnosis not present

## 2022-02-12 DIAGNOSIS — E1142 Type 2 diabetes mellitus with diabetic polyneuropathy: Secondary | ICD-10-CM | POA: Diagnosis not present

## 2022-02-12 DIAGNOSIS — M79676 Pain in unspecified toe(s): Secondary | ICD-10-CM | POA: Diagnosis not present

## 2022-02-12 DIAGNOSIS — L84 Corns and callosities: Secondary | ICD-10-CM | POA: Diagnosis not present

## 2022-02-23 DIAGNOSIS — E1165 Type 2 diabetes mellitus with hyperglycemia: Secondary | ICD-10-CM | POA: Diagnosis not present

## 2022-02-25 DIAGNOSIS — E1165 Type 2 diabetes mellitus with hyperglycemia: Secondary | ICD-10-CM | POA: Diagnosis not present

## 2022-03-07 DIAGNOSIS — F17200 Nicotine dependence, unspecified, uncomplicated: Secondary | ICD-10-CM | POA: Diagnosis not present

## 2022-03-07 DIAGNOSIS — I10 Essential (primary) hypertension: Secondary | ICD-10-CM | POA: Diagnosis not present

## 2022-03-07 DIAGNOSIS — J410 Simple chronic bronchitis: Secondary | ICD-10-CM | POA: Diagnosis not present

## 2022-03-07 DIAGNOSIS — K219 Gastro-esophageal reflux disease without esophagitis: Secondary | ICD-10-CM | POA: Diagnosis not present

## 2022-03-07 DIAGNOSIS — E1140 Type 2 diabetes mellitus with diabetic neuropathy, unspecified: Secondary | ICD-10-CM | POA: Diagnosis not present

## 2022-03-07 DIAGNOSIS — M170 Bilateral primary osteoarthritis of knee: Secondary | ICD-10-CM | POA: Diagnosis not present

## 2022-03-24 ENCOUNTER — Ambulatory Visit: Payer: Medicare Other | Admitting: Orthopaedic Surgery

## 2022-03-26 DIAGNOSIS — E1165 Type 2 diabetes mellitus with hyperglycemia: Secondary | ICD-10-CM | POA: Diagnosis not present

## 2022-03-28 DIAGNOSIS — E1165 Type 2 diabetes mellitus with hyperglycemia: Secondary | ICD-10-CM | POA: Diagnosis not present

## 2022-04-14 DIAGNOSIS — I10 Essential (primary) hypertension: Secondary | ICD-10-CM | POA: Diagnosis not present

## 2022-04-14 DIAGNOSIS — E782 Mixed hyperlipidemia: Secondary | ICD-10-CM | POA: Diagnosis not present

## 2022-04-23 ENCOUNTER — Ambulatory Visit: Payer: Medicare Other | Admitting: Nurse Practitioner

## 2022-04-30 DIAGNOSIS — E1165 Type 2 diabetes mellitus with hyperglycemia: Secondary | ICD-10-CM | POA: Diagnosis not present

## 2022-04-30 DIAGNOSIS — E1140 Type 2 diabetes mellitus with diabetic neuropathy, unspecified: Secondary | ICD-10-CM | POA: Diagnosis not present

## 2022-04-30 DIAGNOSIS — J410 Simple chronic bronchitis: Secondary | ICD-10-CM | POA: Diagnosis not present

## 2022-04-30 DIAGNOSIS — Z0001 Encounter for general adult medical examination with abnormal findings: Secondary | ICD-10-CM | POA: Diagnosis not present

## 2022-04-30 DIAGNOSIS — M170 Bilateral primary osteoarthritis of knee: Secondary | ICD-10-CM | POA: Diagnosis not present

## 2022-04-30 DIAGNOSIS — I10 Essential (primary) hypertension: Secondary | ICD-10-CM | POA: Diagnosis not present

## 2022-04-30 DIAGNOSIS — Z1389 Encounter for screening for other disorder: Secondary | ICD-10-CM | POA: Diagnosis not present

## 2022-04-30 DIAGNOSIS — F17200 Nicotine dependence, unspecified, uncomplicated: Secondary | ICD-10-CM | POA: Diagnosis not present

## 2022-04-30 DIAGNOSIS — K219 Gastro-esophageal reflux disease without esophagitis: Secondary | ICD-10-CM | POA: Diagnosis not present

## 2022-04-30 DIAGNOSIS — Z23 Encounter for immunization: Secondary | ICD-10-CM | POA: Diagnosis not present

## 2022-05-04 DIAGNOSIS — E1165 Type 2 diabetes mellitus with hyperglycemia: Secondary | ICD-10-CM | POA: Diagnosis not present

## 2022-05-06 ENCOUNTER — Ambulatory Visit: Payer: Medicare Other | Admitting: Nurse Practitioner

## 2022-05-07 DIAGNOSIS — E1140 Type 2 diabetes mellitus with diabetic neuropathy, unspecified: Secondary | ICD-10-CM | POA: Diagnosis not present

## 2022-05-07 DIAGNOSIS — E1149 Type 2 diabetes mellitus with other diabetic neurological complication: Secondary | ICD-10-CM | POA: Diagnosis not present

## 2022-05-12 DIAGNOSIS — Z1231 Encounter for screening mammogram for malignant neoplasm of breast: Secondary | ICD-10-CM | POA: Diagnosis not present

## 2022-05-25 DIAGNOSIS — R928 Other abnormal and inconclusive findings on diagnostic imaging of breast: Secondary | ICD-10-CM | POA: Diagnosis not present

## 2022-05-25 DIAGNOSIS — R921 Mammographic calcification found on diagnostic imaging of breast: Secondary | ICD-10-CM | POA: Diagnosis not present

## 2022-05-30 DIAGNOSIS — I10 Essential (primary) hypertension: Secondary | ICD-10-CM | POA: Diagnosis not present

## 2022-05-30 DIAGNOSIS — E1165 Type 2 diabetes mellitus with hyperglycemia: Secondary | ICD-10-CM | POA: Diagnosis not present

## 2022-06-02 ENCOUNTER — Ambulatory Visit: Payer: Medicare Other | Admitting: Nurse Practitioner

## 2022-06-02 DIAGNOSIS — E782 Mixed hyperlipidemia: Secondary | ICD-10-CM

## 2022-06-02 DIAGNOSIS — E1165 Type 2 diabetes mellitus with hyperglycemia: Secondary | ICD-10-CM

## 2022-06-02 DIAGNOSIS — I10 Essential (primary) hypertension: Secondary | ICD-10-CM

## 2022-06-28 DIAGNOSIS — E119 Type 2 diabetes mellitus without complications: Secondary | ICD-10-CM | POA: Diagnosis not present

## 2022-06-30 ENCOUNTER — Other Ambulatory Visit: Payer: Self-pay | Admitting: Nurse Practitioner

## 2022-06-30 DIAGNOSIS — E1165 Type 2 diabetes mellitus with hyperglycemia: Secondary | ICD-10-CM | POA: Diagnosis not present

## 2022-06-30 DIAGNOSIS — I10 Essential (primary) hypertension: Secondary | ICD-10-CM | POA: Diagnosis not present

## 2022-07-03 ENCOUNTER — Other Ambulatory Visit: Payer: Self-pay | Admitting: Nurse Practitioner

## 2022-07-21 IMAGING — CT CT CHEST LUNG CANCER SCREENING LOW DOSE W/O CM
2 of 3 series · 15 of 36 positions shown, 18 images · non-contrast
Comparison: None.

CLINICAL DATA: 59-year-old female with 20 pack-year history of
smoking. Lung cancer screening.

EXAM:
CT CHEST WITHOUT CONTRAST LOW-DOSE FOR LUNG CANCER SCREENING
TECHNIQUE: Multidetector CT imaging of the chest was performed following the
standard protocol without IV contrast.

[Series 2: axial st · axial · 0.70mm/px · z∈[+956,+1176]mm · 12 of 52 slices shown, 15 images]
[im 4/52  mediastinal]
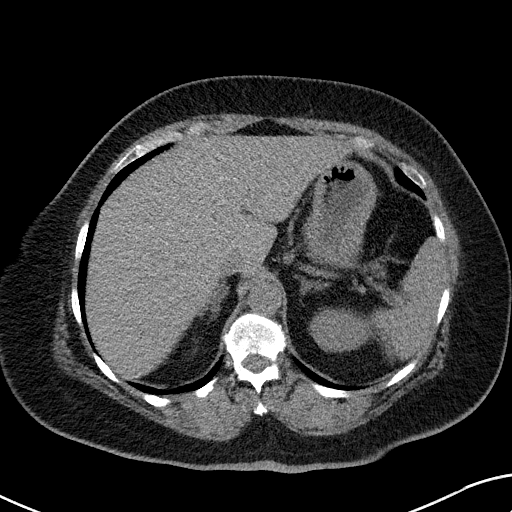
[im 4/52  lung]
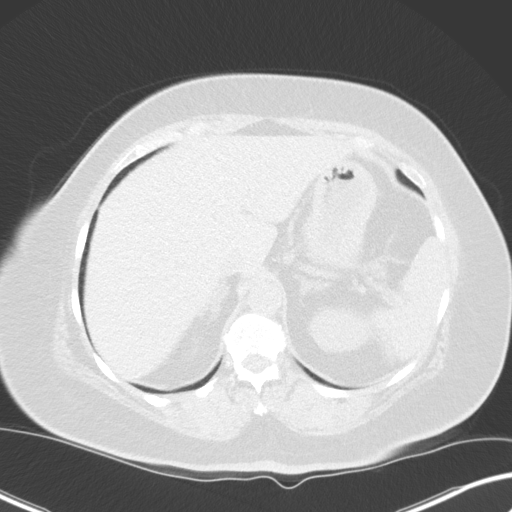
[im 8/52  lung]
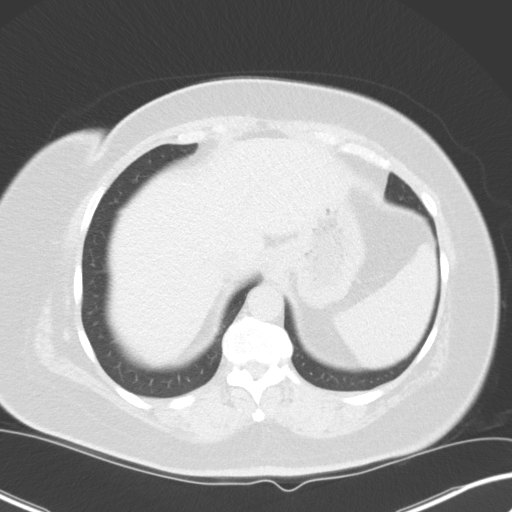
[im 12/52  lung]
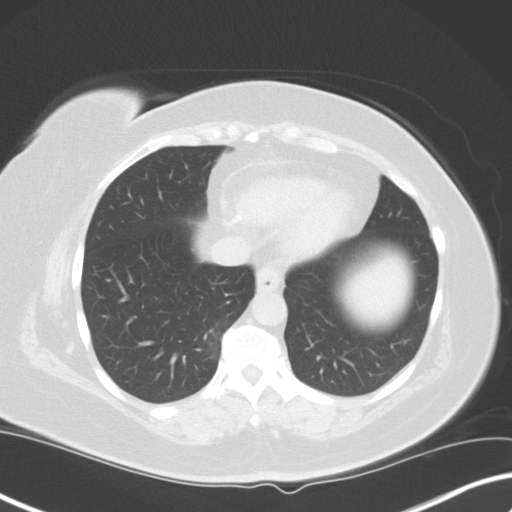
[im 16/52  lung]
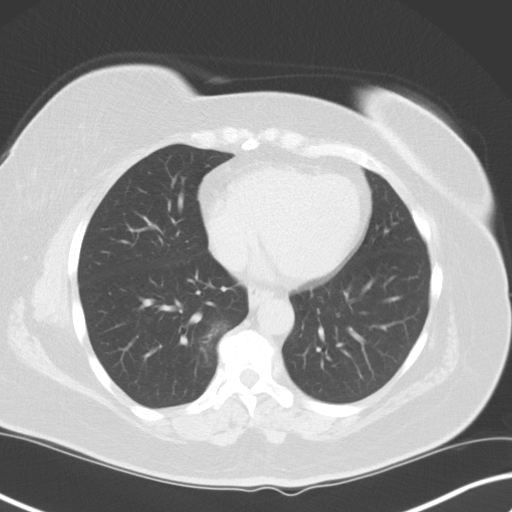
[im 19/52  mediastinal]
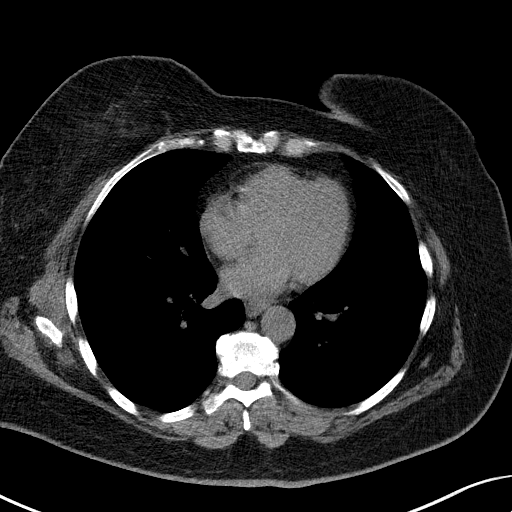
[im 19/52  lung]
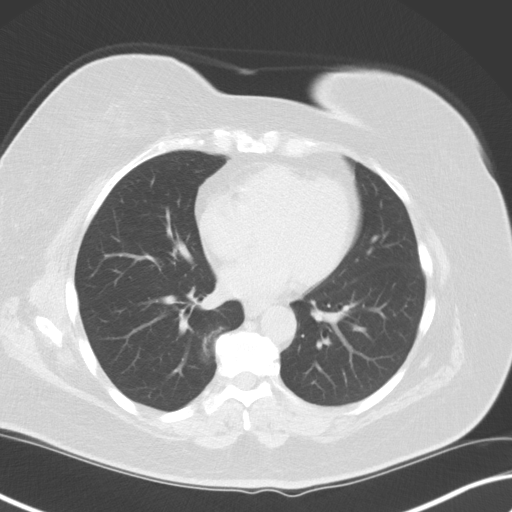
[im 23/52  lung]
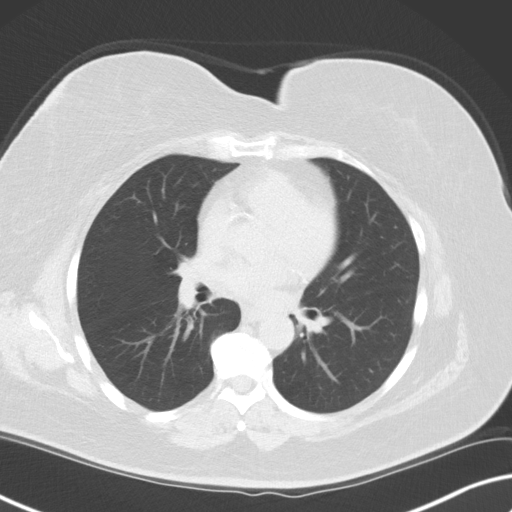
[im 29/52  lung]
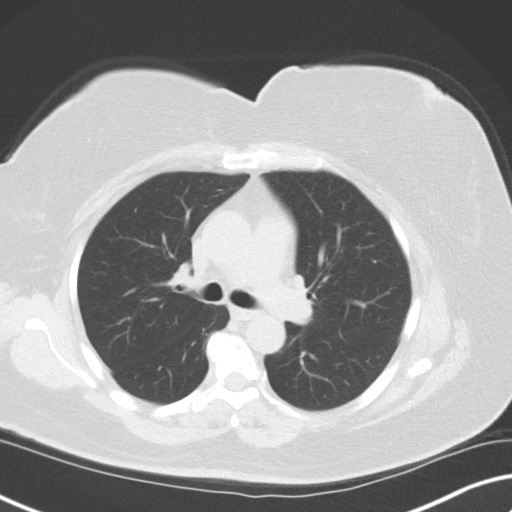
[im 33/52  lung]
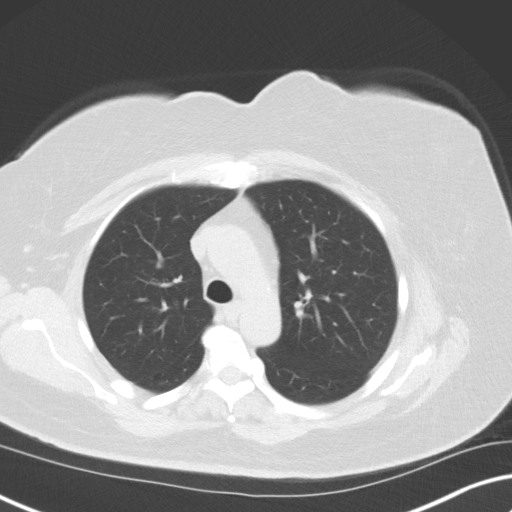
[im 36/52  mediastinal]
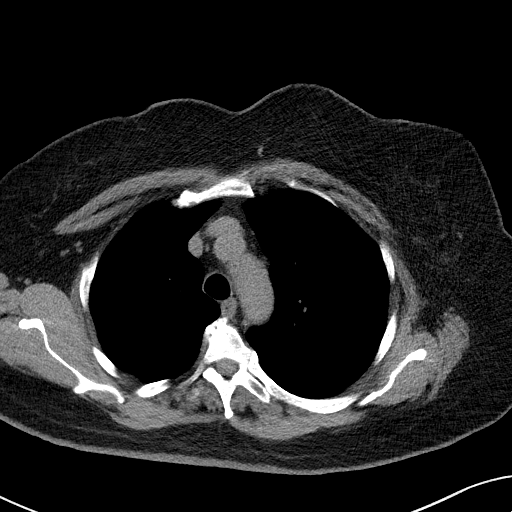
[im 36/52  lung]
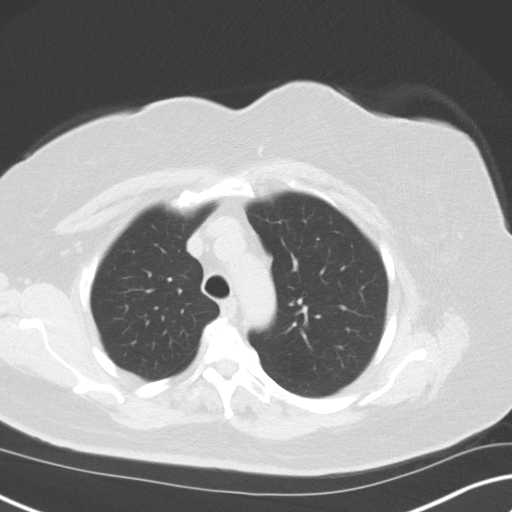
[im 40/52  lung]
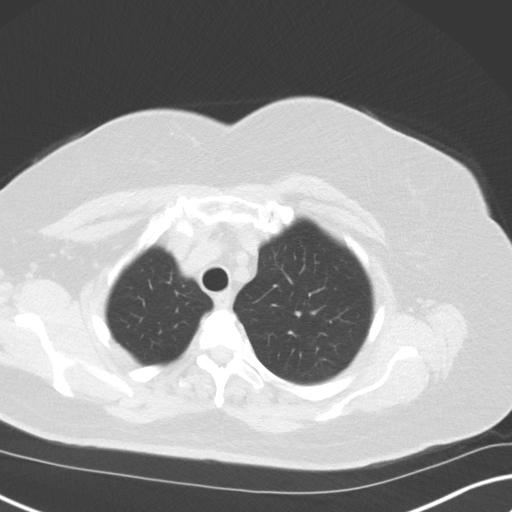
[im 44/52  lung]
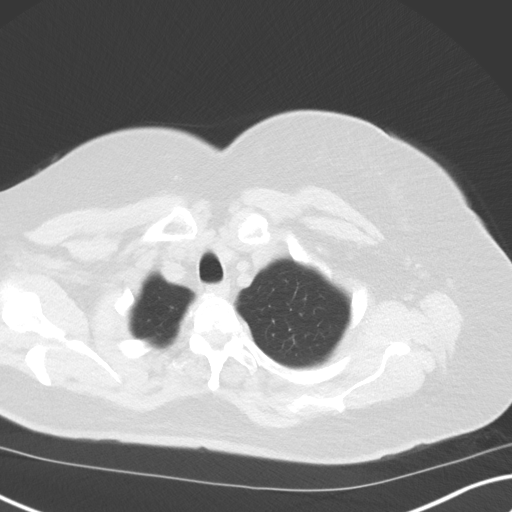
[im 48/52  lung]
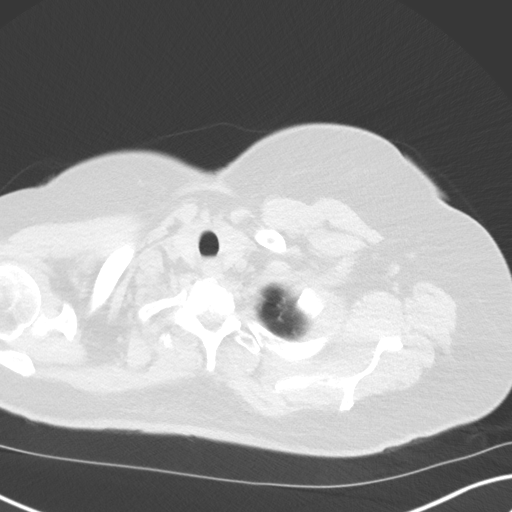

[Series 5: coronal · coronal · 0.59mm/px · 3 of 301 slices shown]
[im 61/301  lung]
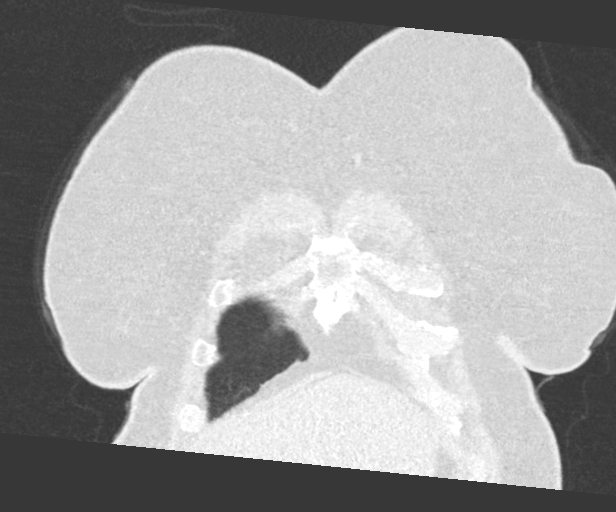
[im 121/301  lung]
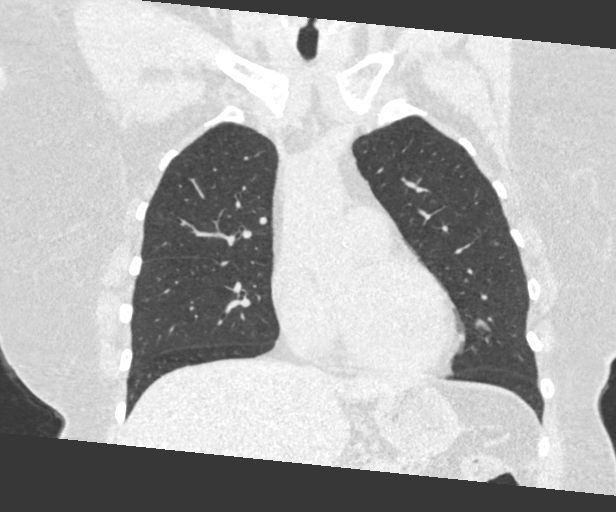
[im 181/301  lung]
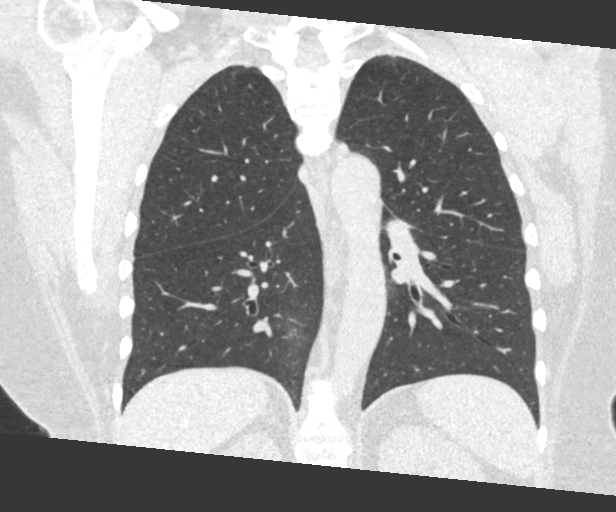

[15 of 36 positions shown; findings below may reference images not displayed]

FINDINGS: Cardiovascular: The heart size is normal. No substantial pericardial
effusion. Coronary artery calcification is evident. Atherosclerotic
calcification is noted in the wall of the thoracic aorta.

Mediastinum/Nodes: No mediastinal lymphadenopathy. No evidence for
gross hilar lymphadenopathy although assessment is limited by the
lack of intravenous contrast on today's study. The esophagus has
normal imaging features. There is no axillary lymphadenopathy.

Lungs/Pleura: Centrilobular emphsyema noted. No suspicious pulmonary
nodule or mass. No focal airspace consolidation. No pleural
effusion.

Upper Abdomen: Unremarkable.

Musculoskeletal: No worrisome lytic or sclerotic osseous
abnormality.
IMPRESSION: 1. Lung-RADS 1, negative. Continue annual screening with low-dose
chest CT without contrast in 12 months.
2. Aortic Atherosclerosis (5OGTP-IPK.K) and Emphysema (5OGTP-I78.B).

## 2022-07-29 DIAGNOSIS — E119 Type 2 diabetes mellitus without complications: Secondary | ICD-10-CM | POA: Diagnosis not present

## 2022-07-30 ENCOUNTER — Ambulatory Visit (INDEPENDENT_AMBULATORY_CARE_PROVIDER_SITE_OTHER): Payer: 59 | Admitting: Nurse Practitioner

## 2022-07-30 ENCOUNTER — Encounter: Payer: Self-pay | Admitting: Nurse Practitioner

## 2022-07-30 VITALS — BP 121/75 | HR 85 | Ht 64.0 in | Wt 174.2 lb

## 2022-07-30 DIAGNOSIS — E782 Mixed hyperlipidemia: Secondary | ICD-10-CM

## 2022-07-30 DIAGNOSIS — E1165 Type 2 diabetes mellitus with hyperglycemia: Secondary | ICD-10-CM

## 2022-07-30 DIAGNOSIS — I10 Essential (primary) hypertension: Secondary | ICD-10-CM | POA: Diagnosis not present

## 2022-07-30 LAB — POCT UA - MICROALBUMIN
Albumin/Creatinine Ratio, Urine, POC: <30
Creatinine, POC: 100 mg/dL
Microalbumin Ur, POC: 10 mg/L

## 2022-07-30 MED ORDER — METFORMIN HCL 500 MG PO TABS: 500.0000 mg | ORAL_TABLET | Freq: Two times a day (BID) | ORAL | 3 refills | Status: DC

## 2022-07-30 MED ORDER — ULTICARE SHORT PEN NEEDLES 31G X 8 MM MISC: 2 refills | Status: DC

## 2022-07-30 MED ORDER — GLIPIZIDE ER 5 MG PO TB24: 5.0000 mg | ORAL_TABLET | Freq: Every day | ORAL | 3 refills | Status: DC

## 2022-07-30 MED ORDER — FIASP FLEXTOUCH 100 UNIT/ML ~~LOC~~ SOPN: 10.0000 [IU] | PEN_INJECTOR | Freq: Three times a day (TID) | SUBCUTANEOUS | 3 refills | Status: DC

## 2022-07-30 MED ORDER — LEVEMIR FLEXPEN 100 UNIT/ML ~~LOC~~ SOPN: 70.0000 [IU] | PEN_INJECTOR | Freq: Every day | SUBCUTANEOUS | 3 refills | Status: DC

## 2022-07-30 NOTE — Progress Notes (Signed)
Endocrinology follow-up note       07/30/2022, 11:30 AM   Subjective:    Patient ID: Kari Miles, female    DOB: [birth year].  Kari Miles is being seen in follow-up for management of chronically uncontrolled type 2 diabetes.  She also has hypertension, hyperlipidemia on treatment.    PMD:  Benetta Spar, MD.   Past Medical History:  Diagnosis Date   Arthritis    Carpal tunnel syndrome    Chronic back pain    Diabetes mellitus without complication (HCC)    Hypertension    Tendonitis    Past Surgical History:  Procedure Laterality Date   ABDOMINAL HYSTERECTOMY     CESAREAN SECTION     ORTHOPEDIC SURGERY     Social History   Socioeconomic History   Marital status: Widowed    Spouse name: Not on file   Number of children: Not on file   Years of education: Not on file   Highest education level: Not on file  Occupational History   Not on file  Tobacco Use   Smoking status: Every Day    Packs/day: 0.10    Years: 30.00    Total pack years: 3.00    Types: Cigarettes   Smokeless tobacco: Never  Vaping Use   Vaping Use: Never used  Substance and Sexual Activity   Alcohol use: No   Drug use: No   Sexual activity: Not on file  Other Topics Concern   Not on file  Social History Narrative   Not on file   Social Determinants of Health   Financial Resource Strain: Not on file  Food Insecurity: Not on file  Transportation Needs: Not on file  Physical Activity: Not on file  Stress: Not on file  Social Connections: Not on file    Family History  Problem Relation Age of Onset   Seizures Mother    Heart disease Father    Cancer Sister    Heart attack Sister    Colon cancer Neg Hx    Outpatient Encounter Medications as of 07/30/2022  Medication Sig   albuterol (PROVENTIL HFA;VENTOLIN HFA) 108 (90 Base) MCG/ACT inhaler Inhale 2 puffs into the lungs every 6 (six) hours as needed for  wheezing or shortness of breath.   Alcohol Swabs 70 % PADS Use as directed to clean skin prior to injections tid and checking blood glucose tid   amLODipine (NORVASC) 5 MG tablet Take 5 mg by mouth daily.   aspirin EC 81 MG tablet Take 81 mg by mouth daily.   atorvastatin (LIPITOR) 40 MG tablet TAKE ONE TABLET BY MOUTH ONCE DAILY   blood glucose meter kit and supplies KIT Dispense based on patient and insurance preference. Use four times daily as directed.   Blood Glucose Monitoring Suppl (ONETOUCH VERIO FLEX SYSTEM) w/Device KIT Use to check blood glucose 4 times daily. Dx E11.65   Blood Glucose Monitoring Suppl (ONETOUCH VERIO REFLECT) w/Device KIT by Does not apply route in the morning, at noon, in the evening, and at bedtime.   ezetimibe (ZETIA) 10 MG tablet Take 10 mg by mouth daily.   gabapentin (NEURONTIN) 300 MG capsule Take 300 mg by mouth 3 (  three) times daily.   glipiZIDE (GLUCOTROL XL) 5 MG 24 hr tablet Take 1 tablet (5 mg total) by mouth daily with breakfast.   glucose blood (ONETOUCH VERIO) test strip Use as instructed to check blood glucose 4 times daily.   glucose blood test strip 1 each by Other route 4 (four) times daily. Use as instructed 4 x daily. E11.65 True Track   glucose blood test strip 1 each by Other route in the morning, at noon, in the evening, and at bedtime. Use as instructed qid. Dx E11.65   hydrochlorothiazide (HYDRODIURIL) 25 MG tablet Take 25 mg by mouth daily.   ibuprofen (ADVIL) 800 MG tablet Take 800 mg by mouth 2 (two) times daily as needed.   Lancet Devices (SIMPLE DIAGNOSTICS LANCING DEV) MISC Use 1 device As directed To test your blood sugar   Lancets MISC Use to test BG qid. E11.65   lisinopril (ZESTRIL) 40 MG tablet Take 40 mg by mouth daily.   spironolactone (ALDACTONE) 25 MG tablet Take 25 mg by mouth 2 (two) times daily.   [DISCONTINUED] insulin aspart (FIASP FLEXTOUCH) 100 UNIT/ML FlexTouch Pen Inject 10-16 Units into the skin 3 (three) times daily  with meals.   [DISCONTINUED] insulin detemir (LEVEMIR FLEXPEN) 100 UNIT/ML FlexPen Inject 70 Units into the skin at bedtime.   [DISCONTINUED] metFORMIN (GLUCOPHAGE) 500 MG tablet TAKE ONE TABLET BY MOUTH TWICE DAILY. TAKE WITH A MEAL. (Patient taking differently: Take 500 mg by mouth 2 (two) times daily with a meal.)   [DISCONTINUED] ULTICARE SHORT PEN NEEDLES 31G X 8 MM MISC USE FOUR TIMES DAILY   insulin aspart (FIASP FLEXTOUCH) 100 UNIT/ML FlexTouch Pen Inject 10-16 Units into the skin 3 (three) times daily with meals.   insulin detemir (LEVEMIR FLEXPEN) 100 UNIT/ML FlexPen Inject 70 Units into the skin at bedtime.   Insulin Pen Needle (ULTICARE SHORT PEN NEEDLES) 31G X 8 MM MISC USE FOUR TIMES DAILY   metFORMIN (GLUCOPHAGE) 500 MG tablet Take 1 tablet (500 mg total) by mouth 2 (two) times daily with a meal.   omeprazole (PRILOSEC) 20 MG capsule Take 2 capsules (40 mg total) by mouth daily.   No facility-administered encounter medications on file as of 07/30/2022.    ALLERGIES: No Known Allergies  VACCINATION STATUS: Immunization History  Administered Date(s) Administered   Influenza-Unspecified 08/28/2018   Moderna Sars-Covid-2 Vaccination 09/21/2019, 10/19/2019    Diabetes She presents for her follow-up diabetic visit. She has type 2 diabetes mellitus. Onset time: she was diagnosed at approximate age of 62 years. Her disease course has been worsening. There are no hypoglycemic associated symptoms. Pertinent negatives for hypoglycemia include no headaches, pallor or seizures. Associated symptoms include blurred vision, foot paresthesias, polydipsia, polyuria and weight loss. Pertinent negatives for diabetes include no chest pain and no polyphagia. There are no hypoglycemic complications. Symptoms are stable. Diabetic complications include nephropathy and peripheral neuropathy. Risk factors for coronary artery disease include diabetes mellitus, dyslipidemia, family history, hypertension,  obesity, sedentary lifestyle, tobacco exposure and post-menopausal. Current diabetic treatment includes intensive insulin program and oral agent (dual therapy). She is compliant with treatment some of the time (has been out of her Kari Miles for a while now). Her weight is decreasing steadily. She is following a generally unhealthy (still consumes sodas (has cut back)) diet. When asked about meal planning, she reported none. She has not had a previous visit with a dietitian. She never participates in exercise. Her home blood glucose trend is increasing rapidly. Her overall blood  glucose range is >200 mg/dl. (She presents today with her meter and CGM (libre 2) showing gross hyperglycemia overall and inconsistent glucose monitoring.  She says she ran out of CGM sensors before Christmas and did not know how to get more.  She says she has been doing fingersticks but the date in the meter is from August (perhaps the date is wrong).  She missed her last few appointments with me and has run out of her [place] as a result.  Her most recent A1c on 05/07/22 was 11.9%. ) An ACE inhibitor/angiotensin II receptor blocker is being taken. She does not see a podiatrist.Eye exam is not current.  Hypertension This is a chronic problem. The current episode started more than 1 year ago. The problem has been resolved since onset. The problem is controlled. Associated symptoms include blurred vision. Pertinent negatives include no chest pain, headaches, palpitations or shortness of breath. Risk factors for coronary artery disease include diabetes mellitus, obesity, sedentary lifestyle, smoking/tobacco exposure, family history, dyslipidemia and stress. Past treatments include ACE inhibitors, calcium channel blockers, diuretics and direct vasodilators. The current treatment provides moderate improvement. Compliance problems include psychosocial issues and diet.  Hypertensive end-organ damage includes kidney disease. Identifiable causes of  hypertension include chronic renal disease.    Review of systems  Constitutional: + steadily decreasing body weight,  current Body mass index is 29.9 kg/m. , + fatigue, no subjective hyperthermia, no subjective hypothermia Eyes: + blurry vision, no xerophthalmia ENT: no sore throat, no nodules palpated in throat, no dysphagia/odynophagia, no hoarseness Cardiovascular: no chest pain, no shortness of breath, no palpitations, no leg swelling Respiratory: no cough, no shortness of breath Gastrointestinal: no nausea/vomiting/diarrhea Musculoskeletal:  left hip problems, multiple falls, walks with cane but recently lost it. Skin: no rashes, no hyperemia, Neurological: no tremors, no dizziness, + numbness/tingling/cold sensation in bilateral feet. Psychiatric: no depression, no anxiety   Objective:    BP 121/75 (BP Location: Left Arm, Patient Position: Sitting, Cuff Size: Normal)   Pulse 85   Ht 5\' 4"  (1.626 m)   Wt 174 lb 3.2 oz (79 kg)   BMI 29.90 kg/m   Wt Readings from Last 3 Encounters:  07/30/22 174 lb 3.2 oz (79 kg)  01/21/22 179 lb (81.2 kg)  12/16/21 181 lb (82.1 kg)    BP Readings from Last 3 Encounters:  07/30/22 121/75  01/21/22 131/84  12/16/21 110/77    Physical Exam- Limited  Constitutional:  Body mass index is 29.9 kg/m. , not in acute distress, normal state of mind Eyes:  EOMI, no exophthalmos Musculoskeletal: no gross deformities, strength intact in all four extremities, no gross restriction of joint movements, walks with cane Skin:  no rashes, no hyperemia Neurological: no tremor with outstretched hands   Diabetic Foot Exam - Simple   Simple Foot Form Diabetic Foot exam was performed with the following findings: Yes 07/30/2022 11:28 AM  Visual Inspection No deformities, no ulcerations, no other skin breakdown bilaterally: Yes Sensation Testing See comments: Yes Pulse Check Posterior Tibialis and Dorsalis pulse intact bilaterally: Yes Comments No  sensation to monofilament tool bilaterally     CMP ( most recent) CMP     Component Value Date/Time   NA 134 (L) 04/01/2021 1400   NA 135 (A) 11/15/2020 0000   K 3.0 (L) 04/01/2021 1400   CL 92 (L) 04/01/2021 1400   CO2 32 04/01/2021 1400   GLUCOSE 152 (H) 04/01/2021 1400   BUN 33 (H) 04/01/2021 1400   BUN  16 11/15/2020 0000   CREATININE 1.51 (H) 04/01/2021 1400   CREATININE 0.56 09/22/2017 1130   CALCIUM 9.6 04/01/2021 1400   PROT 7.7 04/01/2021 1400   ALBUMIN 4.5 04/01/2021 1400   AST 17 04/01/2021 1400   ALT 22 04/01/2021 1400   ALKPHOS 51 04/01/2021 1400   BILITOT 1.1 04/01/2021 1400   GFRNONAA 39 (L) 04/01/2021 1400   GFRNONAA 104 09/22/2017 1130   GFRAA 64 11/15/2020 0000   GFRAA 120 09/22/2017 1130    Diabetic Labs (most recent): Lab Results  Component Value Date   HGBA1C 12.3 11/06/2021   HGBA1C 10.1 (A) 07/28/2021   HGBA1C 9.1 (H) 02/05/2021   MICROALBUR 10 mg/L 07/30/2022   MICROALBUR 10 07/28/2021   MICROALBUR 10 08/20/2020     Lipid Panel     Component Value Date/Time   CHOL 94 12/01/2018 0000   TRIG 68 05/14/2020 0000   HDL 42 05/14/2020 0000   LDLCALC 114 05/14/2020 0000      Assessment & Plan:   1) Uncontrolled type 2 diabetes mellitus with hyperglycemia ([place])  - Kari Miles has currently uncontrolled symptomatic type 2 DM since  62 years of age.  She presents today with her meter and CGM (libre 2) showing gross hyperglycemia overall and inconsistent glucose monitoring.  She says she ran out of CGM sensors before Christmas and did not know how to get more.  She says she has been doing fingersticks but the date in the meter is from August (perhaps the date is wrong).  She missed her last few appointments with me and has run out of her Kari Miles as a result.  Her most recent A1c on 05/07/22 was 11.9%.  her diabetes is complicated by noncompliance/nonadherence, chronic heavy smoking and Kari Miles remains at a high risk for more acute and  chronic complications which include CAD, CVA, CKD, retinopathy, and neuropathy. These are all discussed in detail with the patient.  - Nutritional counseling repeated at each appointment due to patients tendency to fall back in to old habits.  - The patient admits there is a room for improvement in their diet and drink choices. -  Suggestion is made for the patient to avoid simple carbohydrates from their diet including Cakes, Sweet Desserts / Pastries, Ice Cream, Soda (diet and regular), Sweet Tea, Candies, Chips, Cookies, Sweet Pastries, Store Bought Juices, Alcohol in Excess of 1-2 drinks a day, Artificial Sweeteners, Coffee Creamer, and "Sugar-free" Products. This will help patient to have stable blood glucose profile and potentially avoid unintended weight gain.   - I encouraged the patient to switch to unprocessed or minimally processed complex starch and increased protein intake (animal or plant source), fruits, and vegetables.   - Patient is advised to stick to a routine mealtimes to eat 3 meals a day and avoid unnecessary snacks (to snack only to correct hypoglycemia).  - I have approached her with the following individualized plan to manage diabetes and patient agrees:   -Given her current and persisting glycemic burden, she will continue to require intensive treatment with multiple daily injections of insulin and oral therapies in order for her to achieve control of diabetes to target.  -She is advised to restart her medication regimen as follows: Levemir 70 units SQ nightly, Fiasp 10-16 units TID with meals if glucose is above 90 and she is eating (Specific instructions on how to titrate insulin dosage based on glucose readings given to patient in writing), Metformin 500 mg po twice daily,  and Glipizide 5 mg XL daily with breakfast.  I advised her to limit soda intake to meal time only, in hopes we can continue to work and wean her off completely.  -She is encouraged to start  consistently monitoring glucose 4 times daily, before meals and before bed, and to call the clinic if she has readings less than 70 or above 300 for 3 tests in a row.  I gave the patient the number to [employer] to call today since she does not answer unknown numbers so she can receive her CGM which is greatly needed.  She brought in [place] 2 receiver and I can only see that a Dexcom order was placed.  Not sure where she got this Libre 2 from.  - Patient is warned not to take insulin without proper monitoring per orders. -Adjustment parameters are given for hypo and hyperglycemia in writing.  -- she not a suitable candidate for incretin therapy given her chronic heavy smoking.    - Patient specific target  A1c;  LDL, HDL, Triglycerides, and  Waist Circumference were discussed in detail.  2) BP/HTN:  Her blood pressure is controlled to target.  She is advised to continue Norvasc 5 mg po daily, HCTZ 25 mg po daily, Spironolactone 25 mg po twice daily, Clonidine 0.1 mg po twice daily and Lisinopril 40 mg po daily.    3) Lipids/HPL:  Her most recent lipid panel from 05/07/22 shows controlled LDL of 49.  She is advised to continue her dose of Lipitor to 40 mg po daily at bedtime.  Side effects and precautions discussed with her.  4)  Weight/Diet: Her Body mass index is 29.9 kg/m.- clearly complicating her diabetes care.  She is a candidate for modest weight loss.  CDE Consult will be initiated , exercise, and detailed carbohydrates information provided.  She has missed her appointments with Kari Miles, RDE due to transportation issues.    5) Chronic Care/Health Maintenance: -she is on ACEI medications and statin is encouraged to continue to follow up with Ophthalmology, Dentist, Podiatrist at least yearly or according to recommendations, and advised to quit smoking. I have recommended yearly flu vaccine and pneumonia vaccination at least every 5 years; moderate intensity exercise for up to 150 minutes  weekly; and  sleep for at least 7 hours a day.  -The patient was counseled on the dangers of tobacco use, and was advised to quit.  Reviewed strategies to maximize success, including removing cigarettes and smoking materials from environment.  - I advised patient to maintain close follow up with Kari Meiers, MD for primary care needs.     I spent  34  minutes in the care of the patient today including review of labs from [facility], Lipids, Thyroid Function, Hematology (current and previous including abstractions from other facilities); face-to-face time discussing  her blood glucose readings/logs, discussing hypoglycemia and hyperglycemia episodes and symptoms, medications doses, her options of short and long term treatment based on the latest standards of care / guidelines;  discussion about incorporating lifestyle medicine;  and documenting the encounter. Risk reduction counseling performed per USPSTF guidelines to reduce obesity and cardiovascular risk factors.     Please refer to Patient Instructions for Blood Glucose Monitoring and Insulin/Medications Dosing Guide"  in media tab for additional information. Please  also refer to " Patient Self Inventory" in the Media  tab for reviewed elements of pertinent patient history.  Kari Miles participated in the discussions, expressed understanding, and voiced agreement  with the above plans.  All questions were answered to her satisfaction. she is encouraged to contact clinic should she have any questions or concerns prior to her return visit.    Follow up plan: Return in about 3 months (around 10/28/2022) for Diabetes F/U with A1c in office, No previsit labs, Bring meter and logs.   Kari Miles, [facility] [address] [place], [place] [address] Phone: [phone] Fax: [phone]  07/30/2022, 11:30 AM

## 2022-07-31 DIAGNOSIS — I10 Essential (primary) hypertension: Secondary | ICD-10-CM | POA: Diagnosis not present

## 2022-07-31 DIAGNOSIS — E1165 Type 2 diabetes mellitus with hyperglycemia: Secondary | ICD-10-CM | POA: Diagnosis not present

## 2022-08-27 DIAGNOSIS — E119 Type 2 diabetes mellitus without complications: Secondary | ICD-10-CM | POA: Diagnosis not present

## 2022-08-29 DIAGNOSIS — E1165 Type 2 diabetes mellitus with hyperglycemia: Secondary | ICD-10-CM | POA: Diagnosis not present

## 2022-08-30 DIAGNOSIS — I10 Essential (primary) hypertension: Secondary | ICD-10-CM | POA: Diagnosis not present

## 2022-08-30 DIAGNOSIS — E1165 Type 2 diabetes mellitus with hyperglycemia: Secondary | ICD-10-CM | POA: Diagnosis not present

## 2022-09-09 ENCOUNTER — Other Ambulatory Visit (HOSPITAL_COMMUNITY): Payer: Self-pay

## 2022-09-09 ENCOUNTER — Telehealth: Payer: Self-pay

## 2022-09-09 DIAGNOSIS — E1165 Type 2 diabetes mellitus with hyperglycemia: Secondary | ICD-10-CM

## 2022-09-09 MED ORDER — INSULIN LISPRO (1 UNIT DIAL) 100 UNIT/ML (KWIKPEN): 10.0000 [IU] | PEN_INJECTOR | Freq: Three times a day (TID) | SUBCUTANEOUS | 0 refills | Status: DC

## 2022-09-09 NOTE — Telephone Encounter (Signed)
PA request received via CMM for Fiasp FlexTouch 100UNIT/ML pen-injectors  PA not submitted at this time due to preferred alternatives being:  HUMALOG [place] OR HUMALOG [place] med change/reason of continuation of Fiasp.  Key: [id number]

## 2022-09-09 NOTE — Telephone Encounter (Signed)
Rx for Humalog Kwikpen inject 10-16 units tid with meals sent to [facility] to replace fiasp per Rayetta Pigg, FNP

## 2022-09-09 NOTE — Telephone Encounter (Signed)
Humalog is fine

## 2022-09-27 DIAGNOSIS — E119 Type 2 diabetes mellitus without complications: Secondary | ICD-10-CM | POA: Diagnosis not present

## 2022-09-29 DIAGNOSIS — E1165 Type 2 diabetes mellitus with hyperglycemia: Secondary | ICD-10-CM | POA: Diagnosis not present

## 2022-09-30 DIAGNOSIS — I10 Essential (primary) hypertension: Secondary | ICD-10-CM | POA: Diagnosis not present

## 2022-09-30 DIAGNOSIS — E1165 Type 2 diabetes mellitus with hyperglycemia: Secondary | ICD-10-CM | POA: Diagnosis not present

## 2022-10-03 DIAGNOSIS — E1165 Type 2 diabetes mellitus with hyperglycemia: Secondary | ICD-10-CM | POA: Diagnosis not present

## 2022-10-27 DIAGNOSIS — E119 Type 2 diabetes mellitus without complications: Secondary | ICD-10-CM | POA: Diagnosis not present

## 2022-10-29 ENCOUNTER — Encounter: Payer: Self-pay | Admitting: Nurse Practitioner

## 2022-10-29 ENCOUNTER — Ambulatory Visit (INDEPENDENT_AMBULATORY_CARE_PROVIDER_SITE_OTHER): Payer: 59 | Admitting: Nurse Practitioner

## 2022-10-29 VITALS — BP 110/70 | HR 71 | Ht 64.0 in | Wt 184.0 lb

## 2022-10-29 DIAGNOSIS — I10 Essential (primary) hypertension: Secondary | ICD-10-CM

## 2022-10-29 DIAGNOSIS — E782 Mixed hyperlipidemia: Secondary | ICD-10-CM | POA: Diagnosis not present

## 2022-10-29 DIAGNOSIS — E1165 Type 2 diabetes mellitus with hyperglycemia: Secondary | ICD-10-CM

## 2022-10-29 LAB — POCT GLYCOSYLATED HEMOGLOBIN (HGB A1C): Hemoglobin A1C: 8.2 % — AB (ref 4.0–5.6)

## 2022-10-29 MED ORDER — INSULIN LISPRO (1 UNIT DIAL) 100 UNIT/ML (KWIKPEN): 14.0000 [IU] | PEN_INJECTOR | Freq: Three times a day (TID) | SUBCUTANEOUS | 3 refills | Status: DC

## 2022-10-29 NOTE — Progress Notes (Signed)
Endocrinology follow-up note       10/29/2022, 11:24 AM   Subjective:    Patient ID: Kari Miles, female    DOB: [birth year].  Kari Miles is being seen in follow-up for management of chronically uncontrolled type 2 diabetes.  She also has hypertension, hyperlipidemia on treatment.    PMD:  Benetta Spar, MD.   Past Medical History:  Diagnosis Date   Arthritis    Carpal tunnel syndrome    Chronic back pain    Diabetes mellitus without complication (HCC)    Hypertension    Tendonitis    Past Surgical History:  Procedure Laterality Date   ABDOMINAL HYSTERECTOMY     CESAREAN SECTION     ORTHOPEDIC SURGERY     Social History   Socioeconomic History   Marital status: Widowed    Spouse name: Not on file   Number of children: Not on file   Years of education: Not on file   Highest education level: Not on file  Occupational History   Not on file  Tobacco Use   Smoking status: Every Day    Packs/day: 0.10    Years: 30.00    Additional pack years: 0.00    Total pack years: 3.00    Types: Cigarettes   Smokeless tobacco: Never  Vaping Use   Vaping Use: Never used  Substance and Sexual Activity   Alcohol use: No   Drug use: No   Sexual activity: Not on file  Other Topics Concern   Not on file  Social History Narrative   Not on file   Social Determinants of Health   Financial Resource Strain: Not on file  Food Insecurity: Not on file  Transportation Needs: Not on file  Physical Activity: Not on file  Stress: Not on file  Social Connections: Not on file    Family History  Problem Relation Age of Onset   Seizures Mother    Heart disease Father    Cancer Sister    Heart attack Sister    Colon cancer Neg Hx    Outpatient Encounter Medications as of 10/29/2022  Medication Sig   albuterol (PROVENTIL HFA;VENTOLIN HFA) 108 (90 Base) MCG/ACT inhaler Inhale 2 puffs into the lungs every  6 (six) hours as needed for wheezing or shortness of breath.   Alcohol Swabs 70 % PADS Use as directed to clean skin prior to injections tid and checking blood glucose tid   amLODipine (NORVASC) 5 MG tablet Take 5 mg by mouth daily.   aspirin EC 81 MG tablet Take 81 mg by mouth daily.   atorvastatin (LIPITOR) 40 MG tablet TAKE ONE TABLET BY MOUTH ONCE DAILY   blood glucose meter kit and supplies KIT Dispense based on patient and insurance preference. Use four times daily as directed.   Blood Glucose Monitoring Suppl (ONETOUCH VERIO FLEX SYSTEM) w/Device KIT Use to check blood glucose 4 times daily. Dx E11.65   Blood Glucose Monitoring Suppl (ONETOUCH VERIO REFLECT) w/Device KIT by Does not apply route in the morning, at noon, in the evening, and at bedtime.   ezetimibe (ZETIA) 10 MG tablet Take 10 mg by mouth daily.   gabapentin (NEURONTIN) 300 MG  capsule Take 300 mg by mouth 3 (three) times daily.   glipiZIDE (GLUCOTROL XL) 5 MG 24 hr tablet Take 1 tablet (5 mg total) by mouth daily with breakfast.   glucose blood (ONETOUCH VERIO) test strip Use as instructed to check blood glucose 4 times daily.   glucose blood test strip 1 each by Other route 4 (four) times daily. Use as instructed 4 x daily. E11.65 True Track   glucose blood test strip 1 each by Other route in the morning, at noon, in the evening, and at bedtime. Use as instructed qid. Dx E11.65   hydrochlorothiazide (HYDRODIURIL) 25 MG tablet Take 25 mg by mouth daily.   ibuprofen (ADVIL) 800 MG tablet Take 800 mg by mouth 2 (two) times daily as needed.   insulin detemir (LEVEMIR FLEXPEN) 100 UNIT/ML FlexPen Inject 70 Units into the skin at bedtime.   Insulin Pen Needle (ULTICARE SHORT PEN NEEDLES) 31G X 8 MM MISC USE FOUR TIMES DAILY   Lancet Devices (SIMPLE DIAGNOSTICS LANCING DEV) MISC Use 1 device As directed To test your blood sugar   Lancets MISC Use to test BG qid. E11.65   lisinopril (ZESTRIL) 40 MG tablet Take 40 mg by mouth daily.    metFORMIN (GLUCOPHAGE) 500 MG tablet Take 1 tablet (500 mg total) by mouth 2 (two) times daily with a meal.   spironolactone (ALDACTONE) 25 MG tablet Take 25 mg by mouth 2 (two) times daily.   [DISCONTINUED] insulin lispro (HUMALOG KWIKPEN) 100 UNIT/ML KwikPen Inject 10-16 Units into the skin 3 (three) times daily with meals.   insulin lispro (HUMALOG KWIKPEN) 100 UNIT/ML KwikPen Inject 14-20 Units into the skin 3 (three) times daily.   omeprazole (PRILOSEC) 20 MG capsule Take 2 capsules (40 mg total) by mouth daily.   No facility-administered encounter medications on file as of 10/29/2022.    ALLERGIES: No Known Allergies  VACCINATION STATUS: Immunization History  Administered Date(s) Administered   Influenza-Unspecified 08/28/2018   Moderna Sars-Covid-2 Vaccination 09/21/2019, 10/19/2019    Diabetes She presents for her follow-up diabetic visit. She has type 2 diabetes mellitus. Onset time: she was diagnosed at approximate age of 34 years. Her disease course has been improving. There are no hypoglycemic associated symptoms. Pertinent negatives for hypoglycemia include no headaches, pallor or seizures. Associated symptoms include blurred vision and foot paresthesias. Pertinent negatives for diabetes include no chest pain, no polydipsia, no polyphagia, no polyuria and no weight loss. There are no hypoglycemic complications. Symptoms are stable. Diabetic complications include nephropathy and peripheral neuropathy. Risk factors for coronary artery disease include diabetes mellitus, dyslipidemia, family history, hypertension, obesity, sedentary lifestyle, tobacco exposure and post-menopausal. Current diabetic treatment includes intensive insulin program and oral agent (dual therapy). She is compliant with treatment some of the time (has been out of her Kari Miles for a while now). Her weight is increasing steadily. She is following a generally unhealthy (still consumes sodas (has cut back)) diet. When  asked about meal planning, she reported none. She has not had a previous visit with a dietitian. She never participates in exercise. Her home blood glucose trend is decreasing steadily. Her overall blood glucose range is >200 mg/dl. (She presents today with her CGM showing greatly improved, yet still above target glycemic profile.  Her POCT A1c today is 8.2%, improving from last visit of 11.9%.  Analysis of her CGM shows TIR 23%, TAR 77%, TBR 0% with a GMI of 8.4%.  She was drinking a Pepsi in the room today, and admits  she is still drinking too many of them.  She denies any hypoglycemia. ) An ACE inhibitor/angiotensin II receptor blocker is being taken. She does not see a podiatrist.Eye exam is not current.  Hypertension This is a chronic problem. The current episode started more than 1 year ago. The problem has been resolved since onset. The problem is controlled. Associated symptoms include blurred vision. Pertinent negatives include no chest pain, headaches, palpitations or shortness of breath. Risk factors for coronary artery disease include diabetes mellitus, obesity, sedentary lifestyle, smoking/tobacco exposure, family history, dyslipidemia and stress. Past treatments include ACE inhibitors, calcium channel blockers, diuretics and direct vasodilators. The current treatment provides moderate improvement. Compliance problems include psychosocial issues and diet.  Hypertensive end-organ damage includes kidney disease. Identifiable causes of hypertension include chronic renal disease.    Review of systems  Constitutional: + steadily increasing body weight,  current Body mass index is 31.58 kg/m. , + fatigue, no subjective hyperthermia, no subjective hypothermia Eyes: + blurry vision, no xerophthalmia ENT: no sore throat, no nodules palpated in throat, no dysphagia/odynophagia, no hoarseness Cardiovascular: no chest pain, no shortness of breath, no palpitations, no leg swelling Respiratory: no cough,  no shortness of breath Gastrointestinal: no nausea/vomiting/diarrhea Musculoskeletal:  left hip problems, bilateral knee problems, multiple falls, walks with cane but recently lost it. Skin: no rashes, no hyperemia, Neurological: no tremors, no dizziness, + numbness/tingling/cold sensation in bilateral feet. Psychiatric: no depression, no anxiety   Objective:    BP 110/70 (BP Location: Left Arm, Patient Position: Sitting, Cuff Size: Large)   Pulse 71   Ht 5\' 4"  (1.626 m)   Wt 184 lb (83.5 kg)   BMI 31.58 kg/m   Wt Readings from Last 3 Encounters:  10/29/22 184 lb (83.5 kg)  07/30/22 174 lb 3.2 oz (79 kg)  01/21/22 179 lb (81.2 kg)    BP Readings from Last 3 Encounters:  10/29/22 110/70  07/30/22 121/75  01/21/22 131/84    Physical Exam- Limited  Constitutional:  Body mass index is 31.58 kg/m. , not in acute distress, normal state of mind Eyes:  EOMI, no exophthalmos Musculoskeletal: no gross deformities, strength intact in all four extremities, no gross restriction of joint movements, walks with cane but recently lost it and does not have replacement yet Skin:  no rashes, no hyperemia Neurological: no tremor with outstretched hands   Diabetic Foot Exam - Simple   No data filed     CMP ( most recent) CMP     Component Value Date/Time   NA 134 (L) 04/01/2021 1400   NA 135 (A) 11/15/2020 0000   K 3.0 (L) 04/01/2021 1400   CL 92 (L) 04/01/2021 1400   CO2 32 04/01/2021 1400   GLUCOSE 152 (H) 04/01/2021 1400   BUN 33 (H) 04/01/2021 1400   BUN 16 11/15/2020 0000   CREATININE 1.51 (H) 04/01/2021 1400   CREATININE 0.56 09/22/2017 1130   CALCIUM 9.6 04/01/2021 1400   PROT 7.7 04/01/2021 1400   ALBUMIN 4.5 04/01/2021 1400   AST 17 04/01/2021 1400   ALT 22 04/01/2021 1400   ALKPHOS 51 04/01/2021 1400   BILITOT 1.1 04/01/2021 1400   GFRNONAA 39 (L) 04/01/2021 1400   GFRNONAA 104 09/22/2017 1130   GFRAA 64 11/15/2020 0000   GFRAA 120 09/22/2017 1130    Diabetic  Labs (most recent): Lab Results  Component Value Date   HGBA1C 8.2 (A) 10/29/2022   HGBA1C 12.3 11/06/2021   HGBA1C 10.1 (A) 07/28/2021   MICROALBUR  10 mg/L 07/30/2022   MICROALBUR 10 07/28/2021   MICROALBUR 10 08/20/2020     Lipid Panel     Component Value Date/Time   CHOL 94 12/01/2018 0000   TRIG 68 05/14/2020 0000   HDL 42 05/14/2020 0000   LDLCALC 114 05/14/2020 0000      Assessment & Plan:   1) Uncontrolled type 2 diabetes mellitus with hyperglycemia (HCC)  - Kari Miles has currently uncontrolled symptomatic type 2 DM since  62 years of age.  She presents today with her CGM showing greatly improved, yet still above target glycemic profile.  Her POCT A1c today is 8.2%, improving from last visit of 11.9%.  Analysis of her CGM shows TIR 23%, TAR 77%, TBR 0% with a GMI of 8.4%.  She was drinking a Pepsi in the room today, and admits she is still drinking too many of them.  She denies any hypoglycemia.  her diabetes is complicated by noncompliance/nonadherence, chronic heavy smoking and Kari Miles remains at a high risk for more acute and chronic complications which include CAD, CVA, CKD, retinopathy, and neuropathy. These are all discussed in detail with the patient.  - Nutritional counseling repeated at each appointment due to patients tendency to fall back in to old habits.  - The patient admits there is a room for improvement in their diet and drink choices. -  Suggestion is made for the patient to avoid simple carbohydrates from their diet including Cakes, Sweet Desserts / Pastries, Ice Cream, Soda (diet and regular), Sweet Tea, Candies, Chips, Cookies, Sweet Pastries, Store Bought Juices, Alcohol in Excess of 1-2 drinks a day, Artificial Sweeteners, Coffee Creamer, and "Sugar-free" Products. This will help patient to have stable blood glucose profile and potentially avoid unintended weight gain.   - I encouraged the patient to switch to unprocessed or minimally  processed complex starch and increased protein intake (animal or plant source), fruits, and vegetables.   - Patient is advised to stick to a routine mealtimes to eat 3 meals a day and avoid unnecessary snacks (to snack only to correct hypoglycemia).  - I have approached her with the following individualized plan to manage diabetes and patient agrees:   -Given her current and persisting glycemic burden, she will continue to require intensive treatment with multiple daily injections of insulin and oral therapies in order for her to achieve control of diabetes to target.  -She is advised to continue Levemir 70 units SQ nightly and adjust Humalog (changed from Fiasp due to ins pref) 14-20 units TID with meals if glucose is above 90 and she is eating (Specific instructions on how to titrate insulin dosage based on glucose readings given to patient in writing).  She can also continue Metformin 500 mg po twice daily and Glipizide 5 mg XL daily with breakfast.  I advised her to limit soda intake to meal time only, in hopes we can continue to work and wean her off completely.  -She is encouraged to start consistently monitoring glucose 4 times daily (using her CGM), before meals and before bed, and to call the clinic if she has readings less than 70 or above 300 for 3 tests in a row.   - Patient is warned not to take insulin without proper monitoring per orders. -Adjustment parameters are given for hypo and hyperglycemia in writing.  -- she not a suitable candidate for incretin therapy given her chronic heavy smoking.    - Patient specific target  A1c;  LDL,  HDL, Triglycerides, and  Waist Circumference were discussed in detail.  2) BP/HTN:  Her blood pressure is controlled to target.  She is advised to continue Norvasc 5 mg po daily, HCTZ 25 mg po daily, Spironolactone 25 mg po twice daily, Clonidine 0.1 mg po twice daily and Lisinopril 40 mg po daily.    3) Lipids/HPL:  Her most recent lipid panel  from 05/07/22 shows controlled LDL of 49.  She is advised to continue her dose of Lipitor to 40 mg po daily at bedtime.  Side effects and precautions discussed with her.  4)  Weight/Diet: Her Body mass index is 31.58 kg/m.- clearly complicating her diabetes care.  She is a candidate for modest weight loss.  CDE Consult will be initiated , exercise, and detailed carbohydrates information provided.  She has missed her appointments with Norm Salt, RDE due to transportation issues.    5) Chronic Care/Health Maintenance: -she is on ACEI medications and statin is encouraged to continue to follow up with Ophthalmology, Dentist, Podiatrist at least yearly or according to recommendations, and advised to quit smoking. I have recommended yearly flu vaccine and pneumonia vaccination at least every 5 years; moderate intensity exercise for up to 150 minutes weekly; and  sleep for at least 7 hours a day.  -The patient was counseled on the dangers of tobacco use, and was advised to quit.  Reviewed strategies to maximize success, including removing cigarettes and smoking materials from environment.  - I advised patient to maintain close follow up with Benetta Spar, MD for primary care needs.  She sees him next week and I advised her to ask him about getting prescription for a cane if she hasn't already gotten one OTC from the pharmacy.     I spent  38  minutes in the care of the patient today including review of labs from CMP, Lipids, Thyroid Function, Hematology (current and previous including abstractions from other facilities); face-to-face time discussing  her blood glucose readings/logs, discussing hypoglycemia and hyperglycemia episodes and symptoms, medications doses, her options of short and long term treatment based on the latest standards of care / guidelines;  discussion about incorporating lifestyle medicine;  and documenting the encounter. Risk reduction counseling performed per [facility]PSTF  guidelines to reduce obesity and cardiovascular risk factors.     Please refer to Patient Instructions for Blood Glucose Monitoring and Insulin/Medications Dosing Guide"  in media tab for additional information. Please  also refer to " Patient Self Inventory" in the Media  tab for reviewed elements of pertinent patient history.  Kari Miles participated in the discussions, expressed understanding, and voiced agreement with the above plans.  All questions were answered to her satisfaction. she is encouraged to contact clinic should she have any questions or concerns prior to her return visit.    Follow up plan: Return in about 3 months (around 01/29/2023) for Diabetes F/U with A1c in office, No previsit labs, Bring meter and logs.   Kari Miles, [facility] [address] [place], [place] [address] Phone: [phone] Fax: [phone]  10/29/2022, 11:24 AM

## 2022-11-02 DIAGNOSIS — M170 Bilateral primary osteoarthritis of knee: Secondary | ICD-10-CM | POA: Diagnosis not present

## 2022-11-02 DIAGNOSIS — E1165 Type 2 diabetes mellitus with hyperglycemia: Secondary | ICD-10-CM | POA: Diagnosis not present

## 2022-11-02 DIAGNOSIS — R296 Repeated falls: Secondary | ICD-10-CM | POA: Diagnosis not present

## 2022-11-02 DIAGNOSIS — I10 Essential (primary) hypertension: Secondary | ICD-10-CM | POA: Diagnosis not present

## 2022-11-02 DIAGNOSIS — E1140 Type 2 diabetes mellitus with diabetic neuropathy, unspecified: Secondary | ICD-10-CM | POA: Diagnosis not present

## 2022-11-02 DIAGNOSIS — K219 Gastro-esophageal reflux disease without esophagitis: Secondary | ICD-10-CM | POA: Diagnosis not present

## 2022-11-02 DIAGNOSIS — J410 Simple chronic bronchitis: Secondary | ICD-10-CM | POA: Diagnosis not present

## 2022-11-05 DIAGNOSIS — E1165 Type 2 diabetes mellitus with hyperglycemia: Secondary | ICD-10-CM | POA: Diagnosis not present

## 2022-11-12 DIAGNOSIS — R1110 Vomiting, unspecified: Secondary | ICD-10-CM | POA: Diagnosis not present

## 2022-11-12 DIAGNOSIS — N10 Acute tubulo-interstitial nephritis: Secondary | ICD-10-CM | POA: Diagnosis not present

## 2022-11-12 DIAGNOSIS — R109 Unspecified abdominal pain: Secondary | ICD-10-CM | POA: Diagnosis not present

## 2022-11-13 DIAGNOSIS — Z79899 Other long term (current) drug therapy: Secondary | ICD-10-CM | POA: Diagnosis not present

## 2022-11-13 DIAGNOSIS — M1990 Unspecified osteoarthritis, unspecified site: Secondary | ICD-10-CM | POA: Diagnosis not present

## 2022-11-13 DIAGNOSIS — I10 Essential (primary) hypertension: Secondary | ICD-10-CM | POA: Diagnosis not present

## 2022-11-13 DIAGNOSIS — N281 Cyst of kidney, acquired: Secondary | ICD-10-CM | POA: Diagnosis not present

## 2022-11-13 DIAGNOSIS — E785 Hyperlipidemia, unspecified: Secondary | ICD-10-CM | POA: Diagnosis not present

## 2022-11-13 DIAGNOSIS — Z794 Long term (current) use of insulin: Secondary | ICD-10-CM | POA: Diagnosis not present

## 2022-11-13 DIAGNOSIS — R109 Unspecified abdominal pain: Secondary | ICD-10-CM | POA: Diagnosis not present

## 2022-11-13 DIAGNOSIS — R112 Nausea with vomiting, unspecified: Secondary | ICD-10-CM | POA: Diagnosis not present

## 2022-11-13 DIAGNOSIS — Z7982 Long term (current) use of aspirin: Secondary | ICD-10-CM | POA: Diagnosis not present

## 2022-11-13 DIAGNOSIS — F17210 Nicotine dependence, cigarettes, uncomplicated: Secondary | ICD-10-CM | POA: Diagnosis not present

## 2022-11-13 DIAGNOSIS — J45909 Unspecified asthma, uncomplicated: Secondary | ICD-10-CM | POA: Diagnosis not present

## 2022-11-13 DIAGNOSIS — Z7984 Long term (current) use of oral hypoglycemic drugs: Secondary | ICD-10-CM | POA: Diagnosis not present

## 2022-11-13 DIAGNOSIS — E119 Type 2 diabetes mellitus without complications: Secondary | ICD-10-CM | POA: Diagnosis not present

## 2022-11-13 DIAGNOSIS — R300 Dysuria: Secondary | ICD-10-CM | POA: Diagnosis not present

## 2022-11-13 DIAGNOSIS — Z7951 Long term (current) use of inhaled steroids: Secondary | ICD-10-CM | POA: Diagnosis not present

## 2022-11-27 DIAGNOSIS — E119 Type 2 diabetes mellitus without complications: Secondary | ICD-10-CM | POA: Diagnosis not present

## 2022-11-29 DIAGNOSIS — E1165 Type 2 diabetes mellitus with hyperglycemia: Secondary | ICD-10-CM | POA: Diagnosis not present

## 2022-12-03 DIAGNOSIS — I10 Essential (primary) hypertension: Secondary | ICD-10-CM | POA: Diagnosis not present

## 2022-12-03 DIAGNOSIS — E1165 Type 2 diabetes mellitus with hyperglycemia: Secondary | ICD-10-CM | POA: Diagnosis not present

## 2022-12-04 DIAGNOSIS — I10 Essential (primary) hypertension: Secondary | ICD-10-CM | POA: Diagnosis not present

## 2022-12-04 DIAGNOSIS — E1165 Type 2 diabetes mellitus with hyperglycemia: Secondary | ICD-10-CM | POA: Diagnosis not present

## 2022-12-05 DIAGNOSIS — E1165 Type 2 diabetes mellitus with hyperglycemia: Secondary | ICD-10-CM | POA: Diagnosis not present

## 2022-12-27 DIAGNOSIS — E119 Type 2 diabetes mellitus without complications: Secondary | ICD-10-CM | POA: Diagnosis not present

## 2022-12-29 DIAGNOSIS — E1165 Type 2 diabetes mellitus with hyperglycemia: Secondary | ICD-10-CM | POA: Diagnosis not present

## 2023-01-02 DIAGNOSIS — E1165 Type 2 diabetes mellitus with hyperglycemia: Secondary | ICD-10-CM | POA: Diagnosis not present

## 2023-01-02 DIAGNOSIS — I10 Essential (primary) hypertension: Secondary | ICD-10-CM | POA: Diagnosis not present

## 2023-01-04 DIAGNOSIS — E1165 Type 2 diabetes mellitus with hyperglycemia: Secondary | ICD-10-CM | POA: Diagnosis not present

## 2023-01-12 LAB — HM DIABETES EYE EXAM

## 2023-01-27 DIAGNOSIS — I10 Essential (primary) hypertension: Secondary | ICD-10-CM | POA: Diagnosis not present

## 2023-01-27 DIAGNOSIS — E119 Type 2 diabetes mellitus without complications: Secondary | ICD-10-CM | POA: Diagnosis not present

## 2023-01-27 DIAGNOSIS — U071 COVID-19: Secondary | ICD-10-CM | POA: Diagnosis not present

## 2023-01-27 DIAGNOSIS — R059 Cough, unspecified: Secondary | ICD-10-CM | POA: Diagnosis not present

## 2023-01-27 DIAGNOSIS — K5900 Constipation, unspecified: Secondary | ICD-10-CM | POA: Diagnosis not present

## 2023-01-27 DIAGNOSIS — E785 Hyperlipidemia, unspecified: Secondary | ICD-10-CM | POA: Diagnosis not present

## 2023-01-27 DIAGNOSIS — F17210 Nicotine dependence, cigarettes, uncomplicated: Secondary | ICD-10-CM | POA: Diagnosis not present

## 2023-02-01 ENCOUNTER — Ambulatory Visit: Payer: 59 | Admitting: Nurse Practitioner

## 2023-02-01 DIAGNOSIS — E1165 Type 2 diabetes mellitus with hyperglycemia: Secondary | ICD-10-CM

## 2023-02-01 DIAGNOSIS — I10 Essential (primary) hypertension: Secondary | ICD-10-CM

## 2023-02-01 DIAGNOSIS — Z794 Long term (current) use of insulin: Secondary | ICD-10-CM

## 2023-02-01 DIAGNOSIS — Z7984 Long term (current) use of oral hypoglycemic drugs: Secondary | ICD-10-CM

## 2023-02-02 DIAGNOSIS — I10 Essential (primary) hypertension: Secondary | ICD-10-CM | POA: Diagnosis not present

## 2023-02-02 DIAGNOSIS — E1165 Type 2 diabetes mellitus with hyperglycemia: Secondary | ICD-10-CM | POA: Diagnosis not present

## 2023-02-03 DIAGNOSIS — E1165 Type 2 diabetes mellitus with hyperglycemia: Secondary | ICD-10-CM | POA: Diagnosis not present

## 2023-02-12 DIAGNOSIS — H25813 Combined forms of age-related cataract, bilateral: Secondary | ICD-10-CM | POA: Diagnosis not present

## 2023-02-12 DIAGNOSIS — Z794 Long term (current) use of insulin: Secondary | ICD-10-CM | POA: Diagnosis not present

## 2023-02-12 DIAGNOSIS — Z7984 Long term (current) use of oral hypoglycemic drugs: Secondary | ICD-10-CM | POA: Diagnosis not present

## 2023-02-12 DIAGNOSIS — E119 Type 2 diabetes mellitus without complications: Secondary | ICD-10-CM | POA: Diagnosis not present

## 2023-02-15 ENCOUNTER — Encounter: Payer: Self-pay | Admitting: Nurse Practitioner

## 2023-03-05 DIAGNOSIS — E1165 Type 2 diabetes mellitus with hyperglycemia: Secondary | ICD-10-CM | POA: Diagnosis not present

## 2023-03-05 DIAGNOSIS — I10 Essential (primary) hypertension: Secondary | ICD-10-CM | POA: Diagnosis not present

## 2023-04-04 DIAGNOSIS — I10 Essential (primary) hypertension: Secondary | ICD-10-CM | POA: Diagnosis not present

## 2023-04-04 DIAGNOSIS — E1165 Type 2 diabetes mellitus with hyperglycemia: Secondary | ICD-10-CM | POA: Diagnosis not present

## 2023-04-11 IMAGING — DX DG CHEST 2V
2 series · 2 of 2 positions shown · non-contrast
Comparison: 03/28/2021

CLINICAL DATA: Chest pain

EXAM:
CHEST - 2 VIEW

[chest lat]
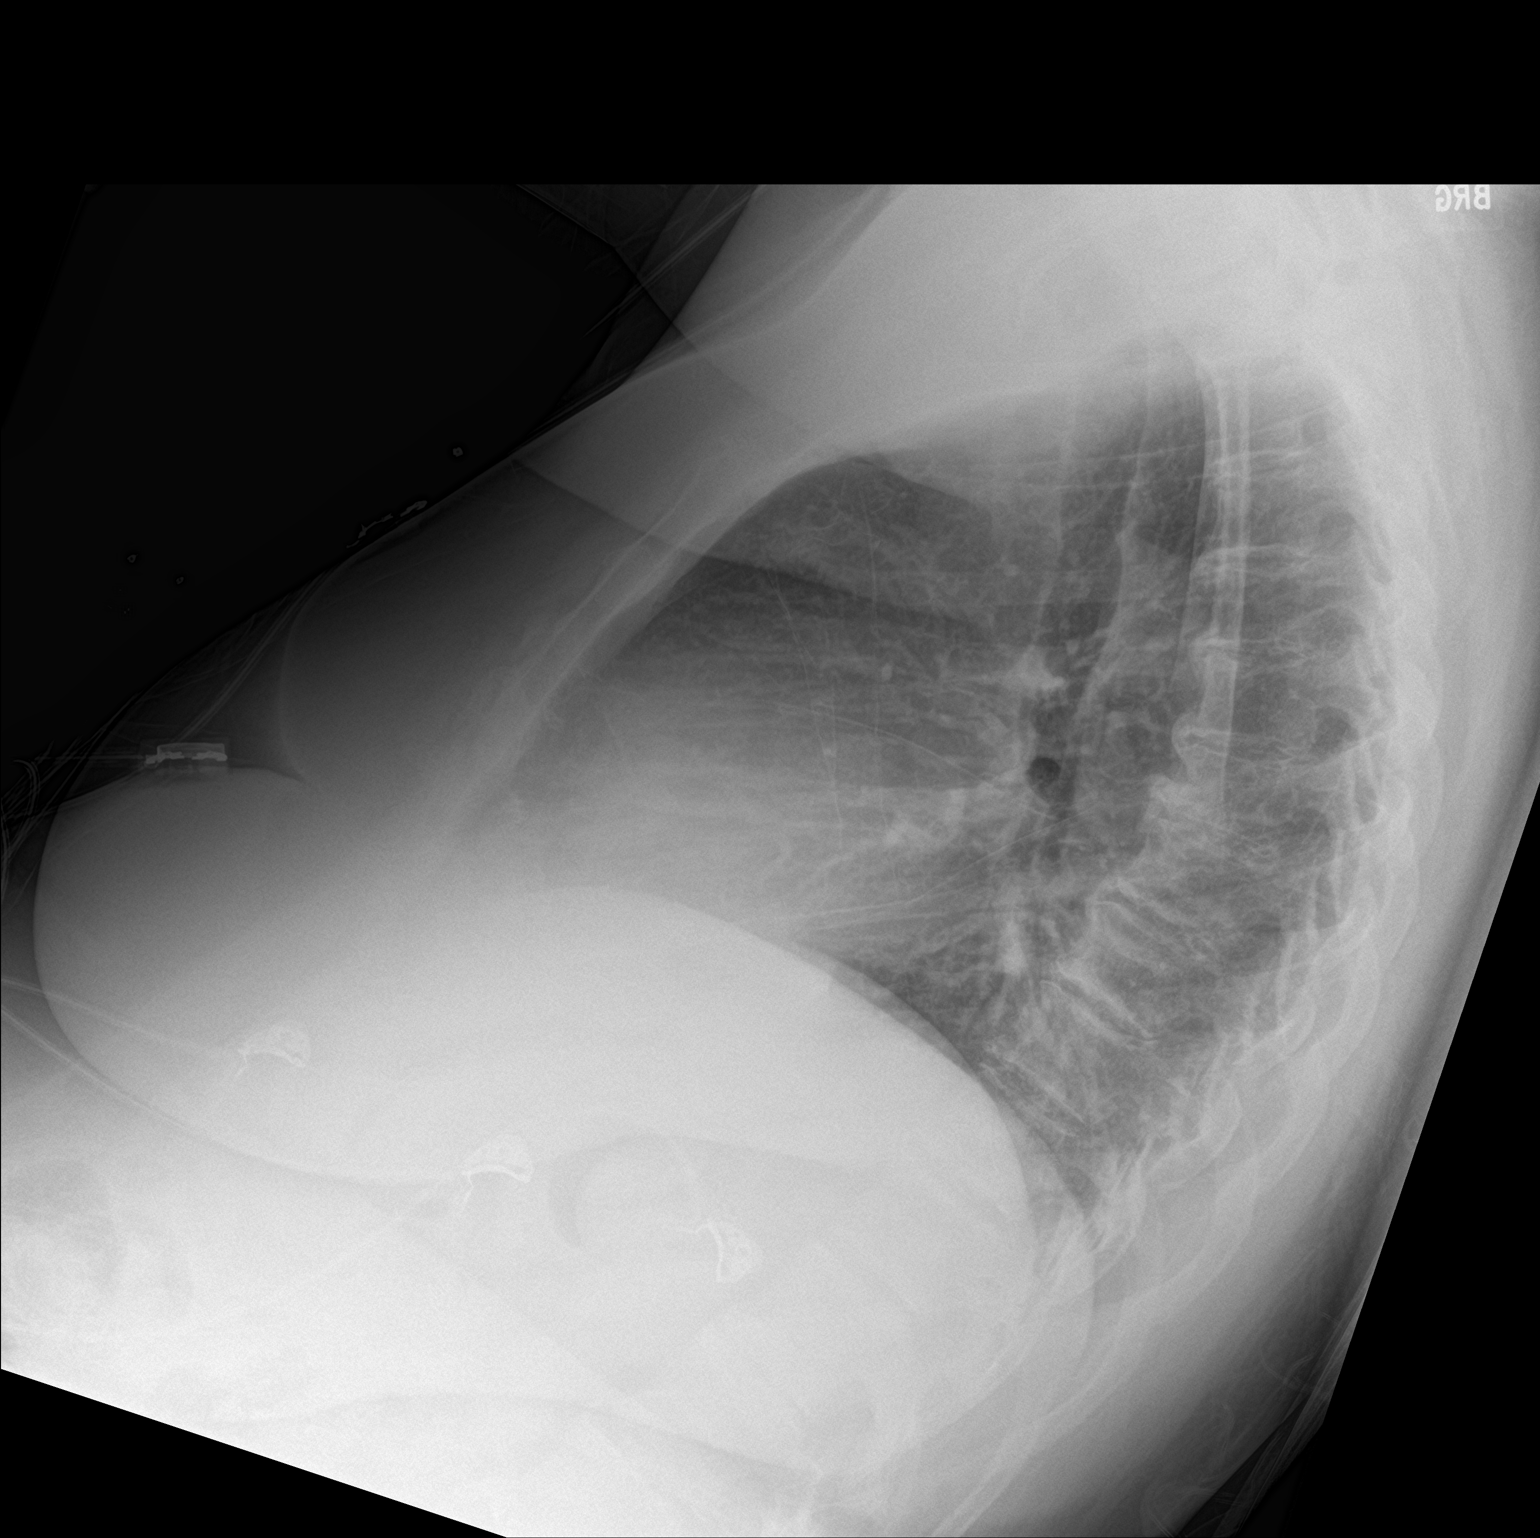

[chest ap]
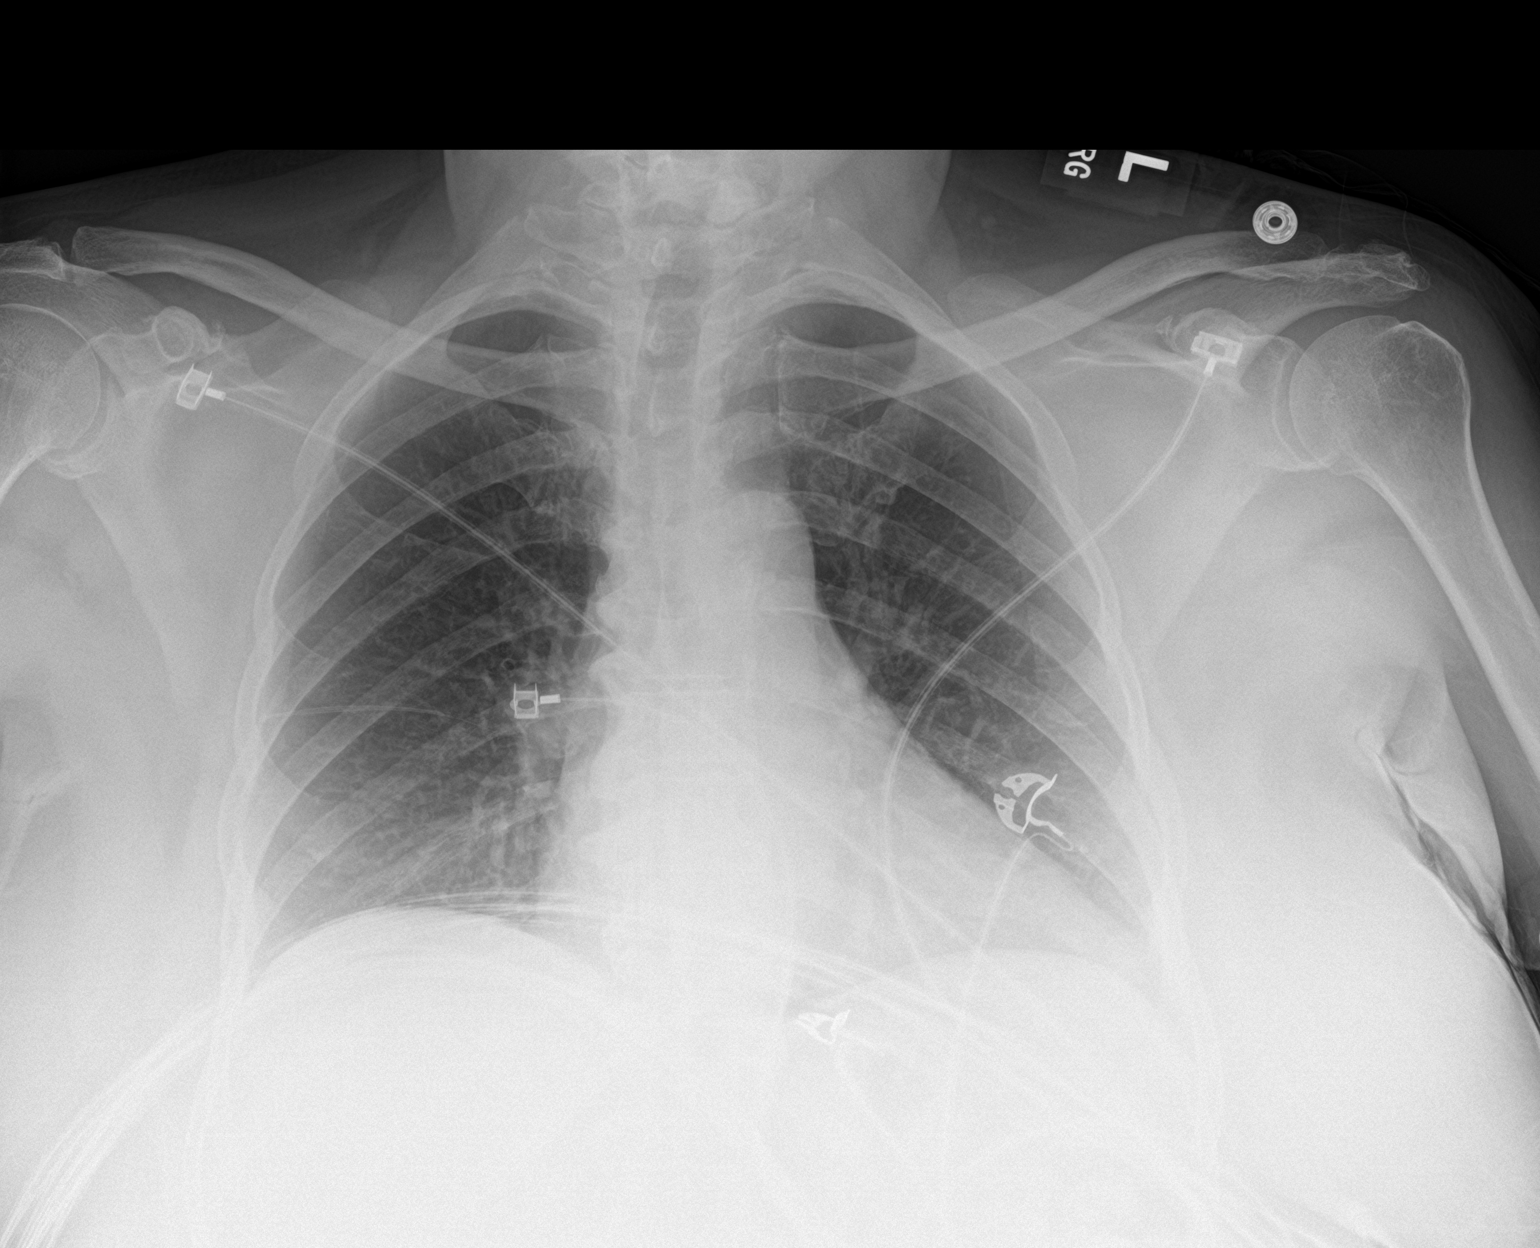

[2 of 2 positions shown; findings below may reference images not displayed]

FINDINGS: The heart size and mediastinal contours are within normal limits.
Low lung volumes. No focal pulmonary opacity. No pleural effusion or
pneumothorax. No acute osseous abnormality.
IMPRESSION: No active cardiopulmonary disease.

## 2023-04-16 DIAGNOSIS — E1165 Type 2 diabetes mellitus with hyperglycemia: Secondary | ICD-10-CM | POA: Diagnosis not present

## 2023-05-04 DIAGNOSIS — I10 Essential (primary) hypertension: Secondary | ICD-10-CM | POA: Diagnosis not present

## 2023-05-04 DIAGNOSIS — F17210 Nicotine dependence, cigarettes, uncomplicated: Secondary | ICD-10-CM | POA: Diagnosis not present

## 2023-05-04 DIAGNOSIS — E119 Type 2 diabetes mellitus without complications: Secondary | ICD-10-CM | POA: Diagnosis not present

## 2023-05-04 DIAGNOSIS — I2510 Atherosclerotic heart disease of native coronary artery without angina pectoris: Secondary | ICD-10-CM | POA: Diagnosis not present

## 2023-05-04 DIAGNOSIS — E782 Mixed hyperlipidemia: Secondary | ICD-10-CM | POA: Diagnosis not present

## 2023-05-07 DIAGNOSIS — E1140 Type 2 diabetes mellitus with diabetic neuropathy, unspecified: Secondary | ICD-10-CM | POA: Diagnosis not present

## 2023-05-07 DIAGNOSIS — F17200 Nicotine dependence, unspecified, uncomplicated: Secondary | ICD-10-CM | POA: Diagnosis not present

## 2023-05-07 DIAGNOSIS — F17210 Nicotine dependence, cigarettes, uncomplicated: Secondary | ICD-10-CM | POA: Diagnosis not present

## 2023-05-07 DIAGNOSIS — M170 Bilateral primary osteoarthritis of knee: Secondary | ICD-10-CM | POA: Diagnosis not present

## 2023-05-07 DIAGNOSIS — K219 Gastro-esophageal reflux disease without esophagitis: Secondary | ICD-10-CM | POA: Diagnosis not present

## 2023-05-07 DIAGNOSIS — I10 Essential (primary) hypertension: Secondary | ICD-10-CM | POA: Diagnosis not present

## 2023-05-07 DIAGNOSIS — Z0001 Encounter for general adult medical examination with abnormal findings: Secondary | ICD-10-CM | POA: Diagnosis not present

## 2023-05-07 DIAGNOSIS — J410 Simple chronic bronchitis: Secondary | ICD-10-CM | POA: Diagnosis not present

## 2023-05-07 DIAGNOSIS — Z1389 Encounter for screening for other disorder: Secondary | ICD-10-CM | POA: Diagnosis not present

## 2023-05-07 DIAGNOSIS — E1165 Type 2 diabetes mellitus with hyperglycemia: Secondary | ICD-10-CM | POA: Diagnosis not present

## 2023-05-07 DIAGNOSIS — Z23 Encounter for immunization: Secondary | ICD-10-CM | POA: Diagnosis not present

## 2023-05-10 ENCOUNTER — Other Ambulatory Visit (HOSPITAL_COMMUNITY): Payer: Self-pay | Admitting: Gerontology

## 2023-05-10 DIAGNOSIS — F17210 Nicotine dependence, cigarettes, uncomplicated: Secondary | ICD-10-CM

## 2023-05-16 DIAGNOSIS — E1165 Type 2 diabetes mellitus with hyperglycemia: Secondary | ICD-10-CM | POA: Diagnosis not present

## 2023-06-15 DIAGNOSIS — E1165 Type 2 diabetes mellitus with hyperglycemia: Secondary | ICD-10-CM | POA: Diagnosis not present

## 2023-07-02 DIAGNOSIS — G8929 Other chronic pain: Secondary | ICD-10-CM | POA: Diagnosis not present

## 2023-07-02 DIAGNOSIS — M25562 Pain in left knee: Secondary | ICD-10-CM | POA: Diagnosis not present

## 2023-07-02 DIAGNOSIS — E1349 Other specified diabetes mellitus with other diabetic neurological complication: Secondary | ICD-10-CM | POA: Diagnosis not present

## 2023-07-02 DIAGNOSIS — M25561 Pain in right knee: Secondary | ICD-10-CM | POA: Diagnosis not present

## 2023-07-07 DIAGNOSIS — E1165 Type 2 diabetes mellitus with hyperglycemia: Secondary | ICD-10-CM | POA: Diagnosis not present

## 2023-07-07 DIAGNOSIS — I10 Essential (primary) hypertension: Secondary | ICD-10-CM | POA: Diagnosis not present

## 2023-08-05 ENCOUNTER — Telehealth: Payer: Self-pay | Admitting: "Endocrinology

## 2023-08-05 DIAGNOSIS — Z1231 Encounter for screening mammogram for malignant neoplasm of breast: Secondary | ICD-10-CM | POA: Diagnosis not present

## 2023-08-05 NOTE — Telephone Encounter (Signed)
 [employer] left a VM stating they can not process CGM since patient has not had a OV since May.

## 2023-08-06 NOTE — Telephone Encounter (Signed)
 Pt has been notified that they need a note within 6 months to receive CGM supplies. She has appt 2/18.

## 2023-08-07 DIAGNOSIS — E1165 Type 2 diabetes mellitus with hyperglycemia: Secondary | ICD-10-CM | POA: Diagnosis not present

## 2023-08-07 DIAGNOSIS — I10 Essential (primary) hypertension: Secondary | ICD-10-CM | POA: Diagnosis not present

## 2023-08-16 ENCOUNTER — Telehealth: Payer: Self-pay | Admitting: *Deleted

## 2023-08-16 NOTE — Telephone Encounter (Signed)
Patient left a message that she has appointment tomorrow. She will not be able to come in because she is under the weather. She is asking that she get a call back to reschedule.

## 2023-08-16 NOTE — Telephone Encounter (Signed)
 Pt rescheduled

## 2023-08-17 ENCOUNTER — Ambulatory Visit: Payer: 59 | Admitting: Nurse Practitioner

## 2023-09-04 DIAGNOSIS — E1165 Type 2 diabetes mellitus with hyperglycemia: Secondary | ICD-10-CM | POA: Diagnosis not present

## 2023-09-04 DIAGNOSIS — I10 Essential (primary) hypertension: Secondary | ICD-10-CM | POA: Diagnosis not present

## 2023-09-22 ENCOUNTER — Encounter: Payer: Self-pay | Admitting: Nurse Practitioner

## 2023-09-22 ENCOUNTER — Ambulatory Visit (INDEPENDENT_AMBULATORY_CARE_PROVIDER_SITE_OTHER): Payer: 59 | Admitting: Nurse Practitioner

## 2023-09-22 VITALS — BP 136/86 | HR 71 | Ht 64.0 in | Wt 177.4 lb

## 2023-09-22 DIAGNOSIS — I10 Essential (primary) hypertension: Secondary | ICD-10-CM

## 2023-09-22 DIAGNOSIS — E782 Mixed hyperlipidemia: Secondary | ICD-10-CM | POA: Diagnosis not present

## 2023-09-22 DIAGNOSIS — Z7984 Long term (current) use of oral hypoglycemic drugs: Secondary | ICD-10-CM

## 2023-09-22 DIAGNOSIS — E1165 Type 2 diabetes mellitus with hyperglycemia: Secondary | ICD-10-CM

## 2023-09-22 DIAGNOSIS — Z794 Long term (current) use of insulin: Secondary | ICD-10-CM | POA: Diagnosis not present

## 2023-09-22 LAB — POCT GLYCOSYLATED HEMOGLOBIN (HGB A1C): Hemoglobin A1C: 10.9 % — AB (ref 4.0–5.6)

## 2023-09-22 MED ORDER — GLIPIZIDE ER 5 MG PO TB24: 5.0000 mg | ORAL_TABLET | Freq: Every day | ORAL | 3 refills | Status: DC

## 2023-09-22 MED ORDER — TOUJEO SOLOSTAR 300 UNIT/ML ~~LOC~~ SOPN: 70.0000 [IU] | PEN_INJECTOR | Freq: Every evening | SUBCUTANEOUS | 3 refills | Status: DC

## 2023-09-22 MED ORDER — INSULIN LISPRO (1 UNIT DIAL) 100 UNIT/ML (KWIKPEN)
14.0000 [IU] | PEN_INJECTOR | Freq: Three times a day (TID) | SUBCUTANEOUS | 3 refills | Status: DC
Start: 2023-09-22 — End: 2024-01-11

## 2023-09-22 MED ORDER — METFORMIN HCL 500 MG PO TABS
500.0000 mg | ORAL_TABLET | Freq: Two times a day (BID) | ORAL | 3 refills | Status: DC
Start: 2023-09-22 — End: 2024-04-28

## 2023-09-22 MED ORDER — ULTICARE SHORT PEN NEEDLES 31G X 8 MM MISC: 2 refills | Status: AC

## 2023-09-22 NOTE — Progress Notes (Signed)
 Endocrinology follow-up note       09/22/2023, 1:35 PM   Subjective:    Patient ID: Kari Miles, female    DOB: 1960/07/18.  Verlee Rossetti is being seen in follow-up for management of chronically uncontrolled type 2 diabetes.  She also has hypertension, hyperlipidemia on treatment.    PMD:  Benetta Spar, MD.   Past Medical History:  Diagnosis Date   Arthritis    Carpal tunnel syndrome    Chronic back pain    Diabetes mellitus without complication (HCC)    Hypertension    Tendonitis    Past Surgical History:  Procedure Laterality Date   ABDOMINAL HYSTERECTOMY     CESAREAN SECTION     ORTHOPEDIC SURGERY     Social History   Socioeconomic History   Marital status: Widowed    Spouse name: Not on file   Number of children: Not on file   Years of education: Not on file   Highest education level: Not on file  Occupational History   Not on file  Tobacco Use   Smoking status: Every Day    Current packs/day: 0.10    Average packs/day: 0.1 packs/day for 30.0 years (3.0 ttl pk-yrs)    Types: Cigarettes   Smokeless tobacco: Never  Vaping Use   Vaping status: Never Used  Substance and Sexual Activity   Alcohol use: No   Drug use: No   Sexual activity: Not on file  Other Topics Concern   Not on file  Social History Narrative   Not on file   Social Drivers of Health   Financial Resource Strain: Medium Risk (10/16/2020)   Received from [facility], [facility]   Overall Financial Resource Strain (CARDIA)    Difficulty of Paying Living Expenses: Somewhat hard  Food Insecurity: Food Insecurity Present (10/16/2020)   Received from [facility], [facility]   Hunger Vital Sign    Worried About Running Out of Food in the Last Year: Sometimes true    Ran Out of Food in the Last Year: Sometimes true  Transportation Needs: No Transportation Needs (10/16/2020)   Received from [facility], [facility]   [facility] - Transportation    Lack of Transportation (Medical): No    Lack of Transportation (Non-Medical): No  Physical Activity: Not on file  Stress: Not on file  Social Connections: Not on file    Family History  Problem Relation Age of Onset   Seizures Mother    Heart disease Father    Cancer Sister    Heart attack Sister    Colon cancer Neg Hx    Outpatient Encounter Medications as of 09/22/2023  Medication Sig   albuterol (PROVENTIL HFA;VENTOLIN HFA) 108 (90 Base) MCG/ACT inhaler Inhale 2 puffs into the lungs every 6 (six) hours as needed for wheezing or shortness of breath.   Alcohol Swabs 70 % PADS Use as directed to clean skin prior to injections tid and checking blood glucose tid   amLODipine (NORVASC) 5 MG tablet Take 5 mg by mouth daily.   aspirin EC 81 MG tablet Take 81 mg by mouth daily.   atorvastatin (LIPITOR) 40 MG  tablet TAKE ONE TABLET BY MOUTH ONCE DAILY   blood glucose meter kit and supplies KIT Dispense based on patient and insurance preference. Use four times daily as directed.   Blood Glucose Monitoring Suppl (ONETOUCH VERIO FLEX SYSTEM) w/Device KIT Use to check blood glucose 4 times daily. Dx E11.65   Blood Glucose Monitoring Suppl (ONETOUCH VERIO REFLECT) w/Device KIT by Does not apply route in the morning, at noon, in the evening, and at bedtime.   ezetimibe (ZETIA) 10 MG tablet Take 10 mg by mouth daily.   gabapentin (NEURONTIN) 300 MG capsule Take 300 mg by mouth 3 (three) times daily.   glucose blood (ONETOUCH VERIO) test strip Use as instructed to check blood glucose 4 times daily.   glucose blood test strip 1 each by Other route 4 (four) times daily. Use as instructed 4 x daily. E11.65 True Track   glucose blood test strip 1 each by Other route in the morning, at noon, in the evening, and at bedtime. Use as instructed qid. Dx E11.65   hydrochlorothiazide (HYDRODIURIL) 25 MG tablet Take 25 mg by mouth daily.   ibuprofen  (ADVIL) 800 MG tablet Take 800 mg by mouth 2 (two) times daily as needed.   insulin glargine, 1 Unit Dial, (TOUJEO SOLOSTAR) 300 UNIT/ML Solostar Pen Inject 70 Units into the skin at bedtime.   Lancet Devices (SIMPLE DIAGNOSTICS LANCING DEV) MISC Use 1 device As directed To test your blood sugar   Lancets MISC Use to test BG qid. E11.65   lisinopril (ZESTRIL) 40 MG tablet Take 40 mg by mouth daily.   spironolactone (ALDACTONE) 25 MG tablet Take 25 mg by mouth 2 (two) times daily.   [DISCONTINUED] glipiZIDE (GLUCOTROL XL) 5 MG 24 hr tablet Take 1 tablet (5 mg total) by mouth daily with breakfast.   [DISCONTINUED] insulin detemir (LEVEMIR FLEXPEN) 100 UNIT/ML FlexPen Inject 70 Units into the skin at bedtime.   [DISCONTINUED] insulin lispro (HUMALOG KWIKPEN) 100 UNIT/ML KwikPen Inject 14-20 Units into the skin 3 (three) times daily.   [DISCONTINUED] Insulin Pen Needle (ULTICARE SHORT PEN NEEDLES) 31G X 8 MM MISC USE FOUR TIMES DAILY   [DISCONTINUED] metFORMIN (GLUCOPHAGE) 500 MG tablet Take 1 tablet (500 mg total) by mouth 2 (two) times daily with a meal.   glipiZIDE (GLUCOTROL XL) 5 MG 24 hr tablet Take 1 tablet (5 mg total) by mouth daily with breakfast.   insulin lispro (HUMALOG KWIKPEN) 100 UNIT/ML KwikPen Inject 14-20 Units into the skin 3 (three) times daily.   Insulin Pen Needle (ULTICARE SHORT PEN NEEDLES) 31G X 8 MM MISC USE FOUR TIMES DAILY   metFORMIN (GLUCOPHAGE) 500 MG tablet Take 1 tablet (500 mg total) by mouth 2 (two) times daily with a meal.   omeprazole (PRILOSEC) 20 MG capsule Take 2 capsules (40 mg total) by mouth daily.   No facility-administered encounter medications on file as of 09/22/2023.    ALLERGIES: No Known Allergies  VACCINATION STATUS: Immunization History  Administered Date(s) Administered   Influenza-Unspecified 08/28/2018   Moderna Sars-Covid-2 Vaccination 09/21/2019, 10/19/2019    Diabetes She presents for her follow-up diabetic visit. She has type 2  diabetes mellitus. Onset time: she was diagnosed at approximate age of 34 years. Her disease course has been fluctuating. There are no hypoglycemic associated symptoms. Pertinent negatives for hypoglycemia include no headaches, pallor or seizures. Associated symptoms include blurred vision and foot paresthesias. Pertinent negatives for diabetes include no chest pain, no polydipsia, no polyphagia, no polyuria and no  weight loss. There are no hypoglycemic complications. Symptoms are stable. Diabetic complications include nephropathy and peripheral neuropathy. Risk factors for coronary artery disease include diabetes mellitus, dyslipidemia, family history, hypertension, obesity, sedentary lifestyle, tobacco exposure and post-menopausal. Current diabetic treatment includes intensive insulin program and oral agent (dual therapy). She is compliant with treatment some of the time. Her weight is fluctuating minimally. She is following a generally unhealthy (still consumes sodas (has cut back)) diet. When asked about meal planning, she reported none. She has not had a previous visit with a dietitian. She never participates in exercise. Her home blood glucose trend is fluctuating dramatically. Her overall blood glucose range is >200 mg/dl. (She presents today, after long absence, with her CGM showing gross hyperglycemia overall.  Her POCT A1c today is 10.9%, increasing from last visit of 8.2%.  Analysis of her CGM shows TIR 4%, TAR 96%, TBR 0% with active time of 14%.  She notes she has not had a soda in the last 2 weeks.  She notes she has not been able to get refills on her medication until seen in the office so she has been stretching out her supply. ) An ACE inhibitor/angiotensin II receptor blocker is being taken. She does not see a podiatrist.Eye exam is not current.  Hypertension This is a chronic problem. The current episode started more than 1 year ago. The problem has been resolved since onset. The problem is  controlled. Associated symptoms include blurred vision. Pertinent negatives include no chest pain, headaches, palpitations or shortness of breath. Risk factors for coronary artery disease include diabetes mellitus, obesity, sedentary lifestyle, smoking/tobacco exposure, family history, dyslipidemia and stress. Past treatments include ACE inhibitors, calcium channel blockers, diuretics and direct vasodilators. The current treatment provides moderate improvement. Compliance problems include psychosocial issues and diet.  Hypertensive end-organ damage includes kidney disease. Identifiable causes of hypertension include chronic renal disease.    Review of systems  Constitutional: + Minimally fluctuating body weight,  current Body mass index is 30.45 kg/m. , no fatigue, no subjective hyperthermia, no subjective hypothermia Eyes: no blurry vision, no xerophthalmia ENT: no sore throat, no nodules palpated in throat, no dysphagia/odynophagia, no hoarseness Cardiovascular: no chest pain, no shortness of breath, no palpitations, no leg swelling Respiratory: no cough, no shortness of breath Gastrointestinal: no nausea/vomiting/diarrhea Musculoskeletal: no muscle/joint aches, bilateral knee pain, walks with cane Skin: no rashes, no hyperemia Neurological: no tremors, no numbness, no tingling, no dizziness Psychiatric: no depression, no anxiety   Objective:    BP 136/86 (BP Location: Right Arm, Patient Position: Sitting)   Pulse 71   Ht 5\' 4"  (1.626 m)   Wt 177 lb 6.4 oz (80.5 kg)   BMI 30.45 kg/m   Wt Readings from Last 3 Encounters:  09/22/23 177 lb 6.4 oz (80.5 kg)  10/29/22 184 lb (83.5 kg)  07/30/22 174 lb 3.2 oz (79 kg)    BP Readings from Last 3 Encounters:  09/22/23 136/86  10/29/22 110/70  07/30/22 121/75     Physical Exam- Limited  Constitutional:  Body mass index is 30.45 kg/m. , not in acute distress, normal state of mind Eyes:  EOMI, no exophthalmos Musculoskeletal: no  gross deformities, strength intact in all four extremities, no gross restriction of joint movements, walks with cane Skin:  no rashes, no hyperemia Neurological: no tremor with outstretched hands   Diabetic Foot Exam - Simple   No data filed     CMP ( most recent) CMP     Component  Value Date/Time   NA 134 (L) 04/01/2021 1400   NA 135 (A) 11/15/2020 0000   K 3.0 (L) 04/01/2021 1400   CL 92 (L) 04/01/2021 1400   CO2 32 04/01/2021 1400   GLUCOSE 152 (H) 04/01/2021 1400   BUN 33 (H) 04/01/2021 1400   BUN 16 11/15/2020 0000   CREATININE 1.51 (H) 04/01/2021 1400   CREATININE 0.56 09/22/2017 1130   CALCIUM 9.6 04/01/2021 1400   PROT 7.7 04/01/2021 1400   ALBUMIN 4.5 04/01/2021 1400   AST 17 04/01/2021 1400   ALT 22 04/01/2021 1400   ALKPHOS 51 04/01/2021 1400   BILITOT 1.1 04/01/2021 1400   GFRNONAA 39 (L) 04/01/2021 1400   GFRNONAA 104 09/22/2017 1130   GFRAA 64 11/15/2020 0000   GFRAA 120 09/22/2017 1130    Diabetic Labs (most recent): Lab Results  Component Value Date   HGBA1C 10.9 (A) 09/22/2023   HGBA1C 8.2 (A) 10/29/2022   HGBA1C 12.3 11/06/2021   MICROALBUR 10 mg/L 07/30/2022   MICROALBUR 10 07/28/2021   MICROALBUR 10 08/20/2020     Lipid Panel     Component Value Date/Time   CHOL 94 12/01/2018 0000   TRIG 68 05/14/2020 0000   HDL 42 05/14/2020 0000   LDLCALC 114 05/14/2020 0000      Assessment & Plan:   1) Uncontrolled type 2 diabetes mellitus with hyperglycemia (HCC)  - Concepcion Elk Kryder has currently uncontrolled symptomatic type 2 DM since  63 years of age.  She presents today, after long absence, with her CGM showing gross hyperglycemia overall.  Her POCT A1c today is 10.9%, increasing from last visit of 8.2%.  Analysis of her CGM shows TIR 4%, TAR 96%, TBR 0% with active time of 14%.  She notes she has not had a soda in the last 2 weeks.  She notes she has not been able to get refills on her medication until seen in the office so she has been  stretching out her supply.  her diabetes is complicated by noncompliance/nonadherence, chronic heavy smoking and [place]STE remains at a high risk for more acute and chronic complications which include CAD, CVA, CKD, retinopathy, and neuropathy. These are all discussed in detail with the patient.  - Nutritional counseling repeated at each appointment due to patients tendency to fall back in to old habits.  - The patient admits there is a room for improvement in their diet and drink choices. -  Suggestion is made for the patient to avoid simple carbohydrates from their diet including Cakes, Sweet Desserts / Pastries, Ice Cream, Soda (diet and regular), Sweet Tea, Candies, Chips, Cookies, Sweet Pastries, Store Bought Juices, Alcohol in Excess of 1-2 drinks a day, Artificial Sweeteners, Coffee Creamer, and "Sugar-free" Products. This will help patient to have stable blood glucose profile and potentially avoid unintended weight gain.   - I encouraged the patient to switch to unprocessed or minimally processed complex starch and increased protein intake (animal or plant source), fruits, and vegetables.   - Patient is advised to stick to a routine mealtimes to eat 3 meals a day and avoid unnecessary snacks (to snack only to correct hypoglycemia).  - I have approached her with the following individualized plan to manage diabetes and patient agrees:   -Given her current and persisting glycemic burden, she will continue to require intensive treatment with multiple daily injections of insulin and oral therapies in order for her to achieve control of diabetes to target.  -She is advised  to continue Toujeo 70 units SQ nightly (changed from Levemir due to product discontinuation) and adjust Humalog (changed from [place] due to ins pref) 14-20 units TID with meals if glucose is above 90 and she is eating (Specific instructions on how to titrate insulin dosage based on glucose readings given to patient in  writing).  She can also continue Metformin 500 mg po twice daily and Glipizide 5 mg XL daily with breakfast.   I did not make any adjustments to regimen as there is not enough CGM data to accurately see patterns.  -She is encouraged to start consistently monitoring glucose 4 times daily (using her CGM), before meals and before bed, and to call the clinic if she has readings less than 70 or above 300 for 3 tests in a row.   I sent for script to upgrade her sensors to [place] 2 plus (since the others will be discontinued in September).  - Patient is warned not to take insulin without proper monitoring per orders. -Adjustment parameters are given for hypo and hyperglycemia in writing.  -- she not a suitable candidate for incretin therapy given her chronic heavy smoking.    - Patient specific target  A1c;  LDL, HDL, Triglycerides, and  Waist Circumference were discussed in detail.  2) BP/HTN:  Her blood pressure is controlled to target.  She is advised to continue medications as prescribed by PCP.  3) Lipids/HPL:  There is no recent lipid panel available to review.  She is advised to continue her dose of Lipitor to 40 mg po daily at bedtime.  Side effects and precautions discussed with her.  4)  Weight/Diet: Her Body mass index is 30.45 kg/m.- clearly complicating her diabetes care.  She is a candidate for modest weight loss.  CDE Consult will be initiated , exercise, and detailed carbohydrates information provided.  She has missed her appointments with Norm Salt, RDE due to transportation issues.    5) Chronic Care/Health Maintenance: -she is on ACEI medications and statin is encouraged to continue to follow up with Ophthalmology, Dentist, Podiatrist at least yearly or according to recommendations, and advised to quit smoking. I have recommended yearly flu vaccine and pneumonia vaccination at least every 5 years; moderate intensity exercise for up to 150 minutes weekly; and  sleep for at least  7 hours a day.  -The patient was counseled on the dangers of tobacco use, and was advised to quit.  Reviewed strategies to maximize success, including removing cigarettes and smoking materials from environment.  - I advised patient to maintain close follow up with Benetta Spar, MD for primary care needs.       I spent  40  minutes in the care of the patient today including review of labs from CMP, Lipids, Thyroid Function, Hematology (current and previous including abstractions from other facilities); face-to-face time discussing  her blood glucose readings/logs, discussing hypoglycemia and hyperglycemia episodes and symptoms, medications doses, her options of short and long term treatment based on the latest standards of care / guidelines;  discussion about incorporating lifestyle medicine;  and documenting the encounter. Risk reduction counseling performed per USPSTF guidelines to reduce obesity and cardiovascular risk factors.     Please refer to Patient Instructions for Blood Glucose Monitoring and Insulin/Medications Dosing Guide"  in media tab for additional information. Please  also refer to " Patient Self Inventory" in the Media  tab for reviewed elements of pertinent patient history.  Verlee Rossetti participated in the discussions,  expressed understanding, and voiced agreement with the above plans.  All questions were answered to her satisfaction. she is encouraged to contact clinic should she have any questions or concerns prior to her return visit.    Follow up plan: Return in about 3 months (around 12/23/2023) for Diabetes F/U with A1c in office, No previsit labs, Bring meter and logs.   Ronny Bacon, [facility] [address] [place], [place] [address] Phone: [phone] Fax: [phone]  09/22/2023, 1:35 PM

## 2023-09-23 ENCOUNTER — Telehealth: Payer: Self-pay | Admitting: *Deleted

## 2023-09-23 NOTE — Telephone Encounter (Signed)
 Rec'd a call from Kathlene November he works with the [employer]. He was requesting the patients recent office visit of 09/22/2023, so that the patient's sensors could be sent out to her.  This was sent via the computer to [phone].

## 2023-09-24 DIAGNOSIS — E1165 Type 2 diabetes mellitus with hyperglycemia: Secondary | ICD-10-CM | POA: Diagnosis not present

## 2023-10-05 DIAGNOSIS — I10 Essential (primary) hypertension: Secondary | ICD-10-CM | POA: Diagnosis not present

## 2023-10-05 DIAGNOSIS — E1165 Type 2 diabetes mellitus with hyperglycemia: Secondary | ICD-10-CM | POA: Diagnosis not present

## 2023-10-25 DIAGNOSIS — E1165 Type 2 diabetes mellitus with hyperglycemia: Secondary | ICD-10-CM | POA: Diagnosis not present

## 2023-10-29 ENCOUNTER — Other Ambulatory Visit (HOSPITAL_COMMUNITY): Payer: Self-pay | Admitting: Gerontology

## 2023-10-29 ENCOUNTER — Ambulatory Visit (HOSPITAL_COMMUNITY)
Admission: RE | Admit: 2023-10-29 | Discharge: 2023-10-29 | Disposition: A | Discharge status: Home / Self Care | Source: Ambulatory Visit | Attending: Gerontology | Admitting: Gerontology

## 2023-10-29 DIAGNOSIS — M25552 Pain in left hip: Secondary | ICD-10-CM | POA: Insufficient documentation

## 2023-10-29 DIAGNOSIS — M170 Bilateral primary osteoarthritis of knee: Secondary | ICD-10-CM | POA: Diagnosis not present

## 2023-10-29 DIAGNOSIS — E1165 Type 2 diabetes mellitus with hyperglycemia: Secondary | ICD-10-CM | POA: Diagnosis not present

## 2023-10-29 DIAGNOSIS — R296 Repeated falls: Secondary | ICD-10-CM | POA: Diagnosis not present

## 2023-10-29 DIAGNOSIS — I10 Essential (primary) hypertension: Secondary | ICD-10-CM | POA: Diagnosis not present

## 2023-11-24 DIAGNOSIS — E1165 Type 2 diabetes mellitus with hyperglycemia: Secondary | ICD-10-CM | POA: Diagnosis not present

## 2023-11-29 DIAGNOSIS — E1165 Type 2 diabetes mellitus with hyperglycemia: Secondary | ICD-10-CM | POA: Diagnosis not present

## 2023-11-29 DIAGNOSIS — I10 Essential (primary) hypertension: Secondary | ICD-10-CM | POA: Diagnosis not present

## 2023-12-01 DIAGNOSIS — E119 Type 2 diabetes mellitus without complications: Secondary | ICD-10-CM | POA: Diagnosis not present

## 2023-12-01 DIAGNOSIS — Z9181 History of falling: Secondary | ICD-10-CM | POA: Diagnosis not present

## 2023-12-01 DIAGNOSIS — F17210 Nicotine dependence, cigarettes, uncomplicated: Secondary | ICD-10-CM | POA: Diagnosis not present

## 2023-12-01 DIAGNOSIS — I2510 Atherosclerotic heart disease of native coronary artery without angina pectoris: Secondary | ICD-10-CM | POA: Diagnosis not present

## 2023-12-01 DIAGNOSIS — I10 Essential (primary) hypertension: Secondary | ICD-10-CM | POA: Diagnosis not present

## 2023-12-01 DIAGNOSIS — E782 Mixed hyperlipidemia: Secondary | ICD-10-CM | POA: Diagnosis not present

## 2023-12-25 DIAGNOSIS — E1165 Type 2 diabetes mellitus with hyperglycemia: Secondary | ICD-10-CM | POA: Diagnosis not present

## 2023-12-28 ENCOUNTER — Ambulatory Visit: Admitting: Nurse Practitioner

## 2023-12-28 DIAGNOSIS — E1165 Type 2 diabetes mellitus with hyperglycemia: Secondary | ICD-10-CM

## 2023-12-28 DIAGNOSIS — I10 Essential (primary) hypertension: Secondary | ICD-10-CM

## 2023-12-28 DIAGNOSIS — Z794 Long term (current) use of insulin: Secondary | ICD-10-CM

## 2023-12-28 DIAGNOSIS — E782 Mixed hyperlipidemia: Secondary | ICD-10-CM

## 2023-12-28 DIAGNOSIS — Z7984 Long term (current) use of oral hypoglycemic drugs: Secondary | ICD-10-CM

## 2023-12-29 DIAGNOSIS — I10 Essential (primary) hypertension: Secondary | ICD-10-CM | POA: Diagnosis not present

## 2023-12-29 DIAGNOSIS — E1165 Type 2 diabetes mellitus with hyperglycemia: Secondary | ICD-10-CM | POA: Diagnosis not present

## 2024-01-07 ENCOUNTER — Ambulatory Visit: Admitting: Nurse Practitioner

## 2024-01-07 DIAGNOSIS — Z794 Long term (current) use of insulin: Secondary | ICD-10-CM

## 2024-01-07 DIAGNOSIS — Z7984 Long term (current) use of oral hypoglycemic drugs: Secondary | ICD-10-CM

## 2024-01-07 DIAGNOSIS — E1165 Type 2 diabetes mellitus with hyperglycemia: Secondary | ICD-10-CM

## 2024-01-07 DIAGNOSIS — E782 Mixed hyperlipidemia: Secondary | ICD-10-CM

## 2024-01-07 DIAGNOSIS — I10 Essential (primary) hypertension: Secondary | ICD-10-CM

## 2024-01-11 ENCOUNTER — Encounter: Payer: Self-pay | Admitting: Nurse Practitioner

## 2024-01-11 ENCOUNTER — Ambulatory Visit (INDEPENDENT_AMBULATORY_CARE_PROVIDER_SITE_OTHER): Admitting: Nurse Practitioner

## 2024-01-11 VITALS — BP 118/80 | HR 76 | Ht 64.0 in | Wt 178.8 lb

## 2024-01-11 DIAGNOSIS — E1165 Type 2 diabetes mellitus with hyperglycemia: Secondary | ICD-10-CM | POA: Diagnosis not present

## 2024-01-11 DIAGNOSIS — Z7984 Long term (current) use of oral hypoglycemic drugs: Secondary | ICD-10-CM | POA: Diagnosis not present

## 2024-01-11 DIAGNOSIS — I10 Essential (primary) hypertension: Secondary | ICD-10-CM

## 2024-01-11 DIAGNOSIS — E782 Mixed hyperlipidemia: Secondary | ICD-10-CM | POA: Diagnosis not present

## 2024-01-11 DIAGNOSIS — Z794 Long term (current) use of insulin: Secondary | ICD-10-CM

## 2024-01-11 LAB — POCT GLYCOSYLATED HEMOGLOBIN (HGB A1C): Hemoglobin A1C: 10.3 % — AB (ref 4.0–5.6)

## 2024-01-11 MED ORDER — INSULIN LISPRO (1 UNIT DIAL) 100 UNIT/ML (KWIKPEN): 14.0000 [IU] | PEN_INJECTOR | Freq: Three times a day (TID) | SUBCUTANEOUS | 3 refills | Status: DC

## 2024-01-11 NOTE — Progress Notes (Signed)
 Endocrinology follow-up note       01/11/2024, 11:16 AM   Subjective:    Patient ID: Kari Miles, female    DOB: 1960/09/06.  Kari Miles is being seen in follow-up for management of chronically uncontrolled type 2 diabetes.  She also has hypertension, hyperlipidemia on treatment.    PMD:  Carlette Benita Area, MD.   Past Medical History:  Diagnosis Date   Arthritis    Carpal tunnel syndrome    Chronic back pain    Diabetes mellitus without complication (HCC)    Hypertension    Tendonitis    Past Surgical History:  Procedure Laterality Date   ABDOMINAL HYSTERECTOMY     CESAREAN SECTION     ORTHOPEDIC SURGERY     Social History   Socioeconomic History   Marital status: Widowed    Spouse name: Not on file   Number of children: Not on file   Years of education: Not on file   Highest education level: Not on file  Occupational History   Not on file  Tobacco Use   Smoking status: Every Day    Current packs/day: 0.10    Average packs/day: 0.1 packs/day for 30.0 years (3.0 ttl pk-yrs)    Types: Cigarettes   Smokeless tobacco: Never  Vaping Use   Vaping status: Never Used  Substance and Sexual Activity   Alcohol  use: No   Drug use: No   Sexual activity: Not on file  Other Topics Concern   Not on file  Social History Narrative   Not on file   Social Drivers of Health   Financial Resource Strain: Medium Risk (10/16/2020)   Received from [facility]   Overall Financial Resource Strain (CARDIA)    Difficulty of Paying Living Expenses: Somewhat hard  Food Insecurity: Food Insecurity Present (10/16/2020)   Received from [facility]   Hunger Vital Sign    Within the past 12 months, you worried that your food would run out before you got the money to buy more.: Sometimes true    Within the past 12 months, the food you bought just didn't last and you didn't have money to get more.:  Sometimes true  Transportation Needs: No Transportation Needs (10/16/2020)   Received from [facility]   PRAPARE - Transportation    Lack of Transportation (Medical): No    Lack of Transportation (Non-Medical): No  Physical Activity: Not on file  Stress: Not on file  Social Connections: Not on file    Family History  Problem Relation Age of Onset   Seizures Mother    Heart disease Father    Cancer Sister    Heart attack Sister    Colon cancer Neg Hx    Outpatient Encounter Medications as of 01/11/2024  Medication Sig   albuterol (PROVENTIL HFA;VENTOLIN HFA) 108 (90 Base) MCG/ACT inhaler Inhale 2 puffs into the lungs every 6 (six) hours as needed for wheezing or shortness of breath.   Alcohol  Swabs  70 % PADS Use as directed to clean skin prior to injections tid and checking blood glucose tid   amLODipine (NORVASC) 5 MG tablet Take 5 mg by mouth daily.   aspirin EC 81  MG tablet Take 81 mg by mouth daily.   atorvastatin  (LIPITOR) 40 MG tablet TAKE ONE TABLET BY MOUTH ONCE DAILY   blood glucose meter kit and supplies KIT Dispense based on patient and insurance preference. Use four times daily as directed.   Blood Glucose Monitoring Suppl (ONETOUCH VERIO FLEX SYSTEM) w/Device KIT Use to check blood glucose 4 times daily. Dx E11.65   Blood Glucose Monitoring Suppl (ONETOUCH VERIO REFLECT) w/Device KIT by Does not apply route in the morning, at noon, in the evening, and at bedtime.   ezetimibe (ZETIA) 10 MG tablet Take 10 mg by mouth daily.   gabapentin (NEURONTIN) 300 MG capsule Take 300 mg by mouth 3 (three) times daily.   glipiZIDE  (GLUCOTROL  XL) 5 MG 24 hr tablet Take 1 tablet (5 mg total) by mouth daily with breakfast.   glucose blood (ONETOUCH VERIO) test strip Use as instructed to check blood glucose 4 times daily.   glucose blood test strip 1 each by Other route 4 (four) times daily. Use as instructed 4 x daily. E11.65 True Track   glucose blood test strip 1 each by Other route  in the morning, at noon, in the evening, and at bedtime. Use as instructed qid. Dx E11.65   hydrochlorothiazide (HYDRODIURIL) 25 MG tablet Take 25 mg by mouth daily.   ibuprofen (ADVIL) 800 MG tablet Take 800 mg by mouth 2 (two) times daily as needed.   insulin  glargine, 1 Unit Dial , (TOUJEO  SOLOSTAR) 300 UNIT/ML Solostar Pen Inject 70 Units into the skin at bedtime.   Insulin  Pen Needle (ULTICARE SHORT PEN NEEDLES) 31G X 8 MM MISC USE FOUR TIMES DAILY   Lancet Devices (SIMPLE DIAGNOSTICS LANCING DEV) MISC Use 1 device As directed To test your blood sugar   Lancets MISC Use to test BG qid. E11.65   lisinopril (ZESTRIL) 40 MG tablet Take 40 mg by mouth daily.   metFORMIN  (GLUCOPHAGE ) 500 MG tablet Take 1 tablet (500 mg total) by mouth 2 (two) times daily with a meal.   omeprazole  (PRILOSEC) 20 MG capsule Take 2 capsules (40 mg total) by mouth daily.   spironolactone (ALDACTONE) 25 MG tablet Take 25 mg by mouth 2 (two) times daily.   [DISCONTINUED] insulin  lispro (HUMALOG  KWIKPEN) 100 UNIT/ML KwikPen Inject 14-20 Units into the skin 3 (three) times daily.   insulin  lispro (HUMALOG  KWIKPEN) 100 UNIT/ML KwikPen Inject 14-20 Units into the skin 3 (three) times daily.   No facility-administered encounter medications on file as of 01/11/2024.    ALLERGIES: No Known Allergies  VACCINATION STATUS: Immunization History  Administered Date(s) Administered   Influenza-Unspecified 08/28/2018   Moderna Sars-Covid-2 Vaccination 09/21/2019, 10/19/2019    Diabetes She presents for her follow-up diabetic visit. She has type 2 diabetes mellitus. Onset time: she was diagnosed at approximate age of 34 years. Her disease course has been fluctuating. There are no hypoglycemic associated symptoms. Pertinent negatives for hypoglycemia include no headaches, pallor or seizures. Associated symptoms include blurred vision and foot paresthesias. Pertinent negatives for diabetes include no chest pain, no polydipsia, no  polyphagia, no polyuria and no weight loss. There are no hypoglycemic complications. Symptoms are stable. Diabetic complications include nephropathy and peripheral neuropathy. Risk factors for coronary artery disease include diabetes mellitus, dyslipidemia, family history, hypertension, obesity, sedentary lifestyle, tobacco exposure and post-menopausal. Current diabetic treatment includes intensive insulin  program and oral agent (dual therapy). She is compliant with treatment some of the time. Her weight is fluctuating minimally. She is following a generally  unhealthy (still consumes sodas (has cut back)) diet. When asked about meal planning, she reported none. She has not had a previous visit with a dietitian. She never participates in exercise. Her home blood glucose trend is increasing steadily. Her breakfast blood glucose range is generally >200 mg/dl. Her lunch blood glucose range is generally >200 mg/dl. Her dinner blood glucose range is generally >200 mg/dl. Her bedtime blood glucose range is generally >200 mg/dl. Her overall blood glucose range is >200 mg/dl. (She presents today with her CGM showing above target glycemic profile overall.  Her POCT A1c today is 10.3%, improving slightly from last visit of 10.9%.  Analysis of her CGM shows TIR 6%, TAR 94%, TBR 0% with a GMI of 9.3%.  She still drinks pepsi (has one with her today) and sweet tea.  She has a nonhealing wound to her left lateral ankle ? DM related. ) An ACE inhibitor/angiotensin II receptor blocker is being taken. She does not see a podiatrist.Eye exam is not current.  Hypertension This is a chronic problem. The current episode started more than 1 year ago. The problem has been resolved since onset. The problem is controlled. Associated symptoms include blurred vision. Pertinent negatives include no chest pain, headaches, palpitations or shortness of breath. Risk factors for coronary artery disease include diabetes mellitus, obesity, sedentary  lifestyle, smoking/tobacco exposure, family history, dyslipidemia and stress. Past treatments include ACE inhibitors, calcium  channel blockers, diuretics and direct vasodilators. The current treatment provides moderate improvement. Compliance problems include psychosocial issues and diet.  Hypertensive end-organ damage includes kidney disease. Identifiable causes of hypertension include chronic renal disease.    Review of systems  Constitutional: + Minimally fluctuating body weight,  current Body mass index is 30.69 kg/m. , no fatigue, no subjective hyperthermia, no subjective hypothermia Eyes: no blurry vision, no xerophthalmia ENT: no sore throat, no nodules palpated in throat, no dysphagia/odynophagia, no hoarseness Cardiovascular: no chest pain, no shortness of breath, no palpitations, no leg swelling Respiratory: no cough, no shortness of breath Gastrointestinal: no nausea/vomiting/diarrhea Musculoskeletal: no muscle/joint aches, bilateral knee pain, walks with cane Skin: no rashes, no hyperemia, wound to left lateral ankle- present for the last several weeks Neurological: no tremors, no numbness, no tingling, no dizziness Psychiatric: no depression, no anxiety   Objective:    BP 118/80 (BP Location: Right Arm, Patient Position: Sitting, Cuff Size: Large)   Pulse 76   Ht 5' 4 (1.626 m)   Wt 178 lb 12.8 oz (81.1 kg)   BMI 30.69 kg/m   Wt Readings from Last 3 Encounters:  01/11/24 178 lb 12.8 oz (81.1 kg)  09/22/23 177 lb 6.4 oz (80.5 kg)  10/29/22 184 lb (83.5 kg)    BP Readings from Last 3 Encounters:  01/11/24 118/80  09/22/23 136/86  10/29/22 110/70     Physical Exam- Limited  Constitutional:  Body mass index is 30.69 kg/m. , not in acute distress, normal state of mind Eyes:  EOMI, no exophthalmos Musculoskeletal: no gross deformities, strength intact in all four extremities, no gross restriction of joint movements, walks with cane Skin:  no rashes, no hyperemia,  wound to left lateral ankle- looks to have some yellowish/gray slough Neurological: no tremor with outstretched hands   Diabetic Foot Exam - Simple   Simple Foot Form Diabetic Foot exam was performed with the following findings: Yes 01/11/2024 11:04 AM  Visual Inspection See comments: Yes Sensation Testing See comments: Yes Pulse Check Posterior Tibialis and Dorsalis pulse intact bilaterally: Yes Comments  No sensation to monofilament tool bilaterally, left lateral ankle has ?foot ulcer- present for last several weeks, non healing.     CMP ( most recent) CMP     Component Value Date/Time   NA 134 (L) 04/01/2021 1400   NA 135 (A) 11/15/2020 0000   K 3.0 (L) 04/01/2021 1400   CL 92 (L) 04/01/2021 1400   CO2 32 04/01/2021 1400   GLUCOSE 152 (H) 04/01/2021 1400   BUN 33 (H) 04/01/2021 1400   BUN 16 11/15/2020 0000   CREATININE 1.51 (H) 04/01/2021 1400   CREATININE 0.56 09/22/2017 1130   CALCIUM  9.6 04/01/2021 1400   PROT 7.7 04/01/2021 1400   ALBUMIN 4.5 04/01/2021 1400   AST 17 04/01/2021 1400   ALT 22 04/01/2021 1400   ALKPHOS 51 04/01/2021 1400   BILITOT 1.1 04/01/2021 1400   GFRNONAA 39 (L) 04/01/2021 1400   GFRNONAA 104 09/22/2017 1130   GFRAA 64 11/15/2020 0000   GFRAA 120 09/22/2017 1130    Diabetic Labs (most recent): Lab Results  Component Value Date   HGBA1C 10.3 (A) 01/11/2024   HGBA1C 10.9 (A) 09/22/2023   HGBA1C 8.2 (A) 10/29/2022   MICROALBUR 10 mg/L 07/30/2022   MICROALBUR 10 07/28/2021   MICROALBUR 10 08/20/2020     Lipid Panel     Component Value Date/Time   CHOL 94 12/01/2018 0000   TRIG 68 05/14/2020 0000   HDL 42 05/14/2020 0000   LDLCALC 114 05/14/2020 0000      Assessment & Plan:   1) Uncontrolled type 2 diabetes mellitus with hyperglycemia (HCC)  - Kari Miles has currently uncontrolled symptomatic type 2 DM since  63 years of age.  She presents today with her CGM showing above target glycemic profile overall.  Her POCT A1c  today is 10.3%, improving slightly from last visit of 10.9%.  Analysis of her CGM shows TIR 6%, TAR 94%, TBR 0% with a GMI of 9.3%.  She still drinks pepsi (has one with her today) and sweet tea.  She has a nonhealing wound to her left lateral ankle ? DM related.  her diabetes is complicated by noncompliance/nonadherence, chronic heavy smoking and Kari Miles at a high risk for more acute and chronic complications which include CAD, CVA, CKD, retinopathy, and neuropathy. These are all discussed in detail with the patient.  - Nutritional counseling repeated at each appointment due to patients tendency to fall back in to old habits.  - The patient admits there is a room for improvement in their diet and drink choices. -  Suggestion is made for the patient to avoid simple carbohydrates from their diet including Cakes, Sweet Desserts / Pastries, Ice Cream, Soda (diet and regular), Sweet Tea, Candies, Chips, Cookies, Sweet Pastries, Store Bought Juices, Alcohol  in Excess of 1-2 drinks a day, Artificial Sweeteners, Coffee Creamer, and Sugar-free Products. This will help patient to have stable blood glucose profile and potentially avoid unintended weight gain.   - I encouraged the patient to switch to unprocessed or minimally processed complex starch and increased protein intake (animal or Miles source), fruits, and vegetables.   - Patient is advised to stick to a routine mealtimes to eat 3 meals a day and avoid unnecessary snacks (to snack only to correct hypoglycemia).  - I have approached her with the following individualized plan to manage diabetes and patient agrees:   -Given her current and persisting glycemic burden, she will continue to require intensive treatment with multiple daily injections  of insulin  and oral therapies in order for her to achieve control of diabetes to target.  -She is advised to continue Toujeo  70 units SQ nightly and Humalog  14-20 units TID with meals if glucose  is above 90 and she is eating (Specific instructions on how to titrate insulin  dosage based on glucose readings given to patient in writing).  She can also continue Metformin  500 mg po twice daily and Glipizide  5 mg XL daily with breakfast.   She is STRONGLY encouraged to stop drinking sugary beverages (even the zero sugar, diet drinks) as it is preventing her insulin  from working properly.  -She is encouraged to continue monitoring glucose 4 times daily (using her CGM), before meals and before bed, and to call the clinic if she has readings less than 70 or above 300 for 3 tests in a row.   I sent for script to upgrade her sensors to Kamas 2 plus (since the others will be discontinued in September).  - Patient is warned not to take insulin  without proper monitoring per orders. -Adjustment parameters are given for hypo and hyperglycemia in writing.  -- she not a suitable candidate for incretin therapy given her chronic heavy smoking.    - Patient specific target  A1c;  LDL, HDL, Triglycerides, and  Waist Circumference were discussed in detail.  2) BP/HTN:  Her blood pressure is controlled to target.  She is advised to continue medications as prescribed by PCP.  3) Lipids/HPL:  There is no recent lipid panel available to review.  She is advised to continue her dose of Lipitor to 40 mg po daily at bedtime.  Side effects and precautions discussed with her.  4)  Weight/Diet: Her Body mass index is 30.69 kg/m.- clearly complicating her diabetes care.  She is a candidate for modest weight loss.  CDE Consult will be initiated , exercise, and detailed carbohydrates information provided.  She has missed her appointments with Santana Duke, RDE due to transportation issues.    5) Chronic Care/Health Maintenance: -she is on ACEI medications and statin is encouraged to continue to follow up with Ophthalmology, Dentist, Podiatrist at least yearly or according to recommendations, and advised to quit smoking.  I have recommended yearly flu vaccine and pneumonia vaccination at least every 5 years; moderate intensity exercise for up to 150 minutes weekly; and  sleep for at least 7 hours a day.  -The patient was counseled on the dangers of tobacco use, and was advised to quit.  Reviewed strategies to maximize success, including removing cigarettes and smoking materials from environment.  - I advised patient to maintain close follow up with Fanta, Tesfaye Demissie, MD for primary care needs.   I encouraged her to reach out to her PCP about the ankle wound, may need wound care in the future to promote healing.    I spent  37  minutes in the care of the patient today including review of labs from CMP, Lipids, Thyroid  Function, Hematology (current and previous including abstractions from other facilities); face-to-face time discussing  her blood glucose readings/logs, discussing hypoglycemia and hyperglycemia episodes and symptoms, medications doses, her options of short and long term treatment based on the latest standards of care / guidelines;  discussion about incorporating lifestyle medicine;  and documenting the encounter. Risk reduction counseling performed per USPSTF guidelines to reduce obesity and cardiovascular risk factors.     Please refer to Patient Instructions for Blood Glucose Monitoring and Insulin /Medications Dosing Guide  in media tab for  additional information. Please  also refer to  Patient Self Inventory in the Media  tab for reviewed elements of pertinent patient history.  Kari Miles in the discussions, expressed understanding, and voiced agreement with the above plans.  All questions were answered to her satisfaction. she is encouraged to contact clinic should she have any questions or concerns prior to her return visit.    Follow up plan: Return in about 3 months (around 04/12/2024) for Diabetes F/U with A1c in office, No previsit labs, Bring meter and  logs.   Benton Rio, [facility] [address] [place], [place] [address] Phone: [phone] Fax: [phone]  01/11/2024, 11:16 AM

## 2024-01-14 DIAGNOSIS — E1165 Type 2 diabetes mellitus with hyperglycemia: Secondary | ICD-10-CM | POA: Diagnosis not present

## 2024-01-14 DIAGNOSIS — I10 Essential (primary) hypertension: Secondary | ICD-10-CM | POA: Diagnosis not present

## 2024-01-14 DIAGNOSIS — R052 Subacute cough: Secondary | ICD-10-CM | POA: Diagnosis not present

## 2024-01-14 DIAGNOSIS — J410 Simple chronic bronchitis: Secondary | ICD-10-CM | POA: Diagnosis not present

## 2024-02-14 DIAGNOSIS — I10 Essential (primary) hypertension: Secondary | ICD-10-CM | POA: Diagnosis not present

## 2024-02-14 DIAGNOSIS — E1165 Type 2 diabetes mellitus with hyperglycemia: Secondary | ICD-10-CM | POA: Diagnosis not present

## 2024-02-26 DIAGNOSIS — E119 Type 2 diabetes mellitus without complications: Secondary | ICD-10-CM | POA: Diagnosis not present

## 2024-02-26 DIAGNOSIS — E785 Hyperlipidemia, unspecified: Secondary | ICD-10-CM | POA: Diagnosis not present

## 2024-02-26 DIAGNOSIS — I10 Essential (primary) hypertension: Secondary | ICD-10-CM | POA: Diagnosis not present

## 2024-02-26 DIAGNOSIS — Z5941 Food insecurity: Secondary | ICD-10-CM | POA: Diagnosis not present

## 2024-02-26 DIAGNOSIS — F17210 Nicotine dependence, cigarettes, uncomplicated: Secondary | ICD-10-CM | POA: Diagnosis not present

## 2024-02-26 DIAGNOSIS — N644 Mastodynia: Secondary | ICD-10-CM | POA: Diagnosis not present

## 2024-02-26 DIAGNOSIS — N611 Abscess of the breast and nipple: Secondary | ICD-10-CM | POA: Diagnosis not present

## 2024-02-26 DIAGNOSIS — Z7984 Long term (current) use of oral hypoglycemic drugs: Secondary | ICD-10-CM | POA: Diagnosis not present

## 2024-02-26 DIAGNOSIS — Z79899 Other long term (current) drug therapy: Secondary | ICD-10-CM | POA: Diagnosis not present

## 2024-02-29 DIAGNOSIS — I2510 Atherosclerotic heart disease of native coronary artery without angina pectoris: Secondary | ICD-10-CM | POA: Diagnosis not present

## 2024-02-29 DIAGNOSIS — R0789 Other chest pain: Secondary | ICD-10-CM | POA: Diagnosis not present

## 2024-02-29 DIAGNOSIS — Z7982 Long term (current) use of aspirin: Secondary | ICD-10-CM | POA: Diagnosis not present

## 2024-02-29 DIAGNOSIS — I10 Essential (primary) hypertension: Secondary | ICD-10-CM | POA: Diagnosis not present

## 2024-02-29 DIAGNOSIS — Z79899 Other long term (current) drug therapy: Secondary | ICD-10-CM | POA: Diagnosis not present

## 2024-02-29 DIAGNOSIS — E119 Type 2 diabetes mellitus without complications: Secondary | ICD-10-CM | POA: Diagnosis not present

## 2024-02-29 DIAGNOSIS — Z7984 Long term (current) use of oral hypoglycemic drugs: Secondary | ICD-10-CM | POA: Diagnosis not present

## 2024-02-29 DIAGNOSIS — I4891 Unspecified atrial fibrillation: Secondary | ICD-10-CM | POA: Diagnosis not present

## 2024-02-29 DIAGNOSIS — Z86711 Personal history of pulmonary embolism: Secondary | ICD-10-CM | POA: Diagnosis not present

## 2024-02-29 DIAGNOSIS — E785 Hyperlipidemia, unspecified: Secondary | ICD-10-CM | POA: Diagnosis not present

## 2024-02-29 DIAGNOSIS — F17210 Nicotine dependence, cigarettes, uncomplicated: Secondary | ICD-10-CM | POA: Diagnosis not present

## 2024-02-29 DIAGNOSIS — J9811 Atelectasis: Secondary | ICD-10-CM | POA: Diagnosis not present

## 2024-02-29 DIAGNOSIS — R079 Chest pain, unspecified: Secondary | ICD-10-CM | POA: Diagnosis not present

## 2024-03-16 DIAGNOSIS — E1165 Type 2 diabetes mellitus with hyperglycemia: Secondary | ICD-10-CM | POA: Diagnosis not present

## 2024-03-16 DIAGNOSIS — I10 Essential (primary) hypertension: Secondary | ICD-10-CM | POA: Diagnosis not present

## 2024-04-07 DIAGNOSIS — E1165 Type 2 diabetes mellitus with hyperglycemia: Secondary | ICD-10-CM | POA: Diagnosis not present

## 2024-04-07 DIAGNOSIS — F17200 Nicotine dependence, unspecified, uncomplicated: Secondary | ICD-10-CM | POA: Diagnosis not present

## 2024-04-07 DIAGNOSIS — E1140 Type 2 diabetes mellitus with diabetic neuropathy, unspecified: Secondary | ICD-10-CM | POA: Diagnosis not present

## 2024-04-07 DIAGNOSIS — Z0001 Encounter for general adult medical examination with abnormal findings: Secondary | ICD-10-CM | POA: Diagnosis not present

## 2024-04-07 DIAGNOSIS — J410 Simple chronic bronchitis: Secondary | ICD-10-CM | POA: Diagnosis not present

## 2024-04-07 DIAGNOSIS — K219 Gastro-esophageal reflux disease without esophagitis: Secondary | ICD-10-CM | POA: Diagnosis not present

## 2024-04-07 DIAGNOSIS — I10 Essential (primary) hypertension: Secondary | ICD-10-CM | POA: Diagnosis not present

## 2024-04-07 DIAGNOSIS — Z1389 Encounter for screening for other disorder: Secondary | ICD-10-CM | POA: Diagnosis not present

## 2024-04-07 DIAGNOSIS — Z23 Encounter for immunization: Secondary | ICD-10-CM | POA: Diagnosis not present

## 2024-04-19 ENCOUNTER — Ambulatory Visit: Admitting: Nurse Practitioner

## 2024-04-28 ENCOUNTER — Encounter: Payer: Self-pay | Admitting: Nurse Practitioner

## 2024-04-28 ENCOUNTER — Ambulatory Visit: Admitting: Nurse Practitioner

## 2024-04-28 VITALS — BP 134/74 | HR 81 | Ht 64.0 in | Wt 178.4 lb

## 2024-04-28 DIAGNOSIS — Z794 Long term (current) use of insulin: Secondary | ICD-10-CM | POA: Diagnosis not present

## 2024-04-28 DIAGNOSIS — Z7984 Long term (current) use of oral hypoglycemic drugs: Secondary | ICD-10-CM

## 2024-04-28 DIAGNOSIS — E1165 Type 2 diabetes mellitus with hyperglycemia: Secondary | ICD-10-CM | POA: Diagnosis not present

## 2024-04-28 DIAGNOSIS — I10 Essential (primary) hypertension: Secondary | ICD-10-CM | POA: Diagnosis not present

## 2024-04-28 DIAGNOSIS — E782 Mixed hyperlipidemia: Secondary | ICD-10-CM

## 2024-04-28 LAB — POCT GLYCOSYLATED HEMOGLOBIN (HGB A1C): Hemoglobin A1C: 11.2 % — AB (ref 4.0–5.6)

## 2024-04-28 MED ORDER — GLIPIZIDE ER 5 MG PO TB24: 5.0000 mg | ORAL_TABLET | Freq: Every day | ORAL | 3 refills | Status: AC

## 2024-04-28 MED ORDER — TOUJEO SOLOSTAR 300 UNIT/ML ~~LOC~~ SOPN: 80.0000 [IU] | PEN_INJECTOR | Freq: Every evening | SUBCUTANEOUS | 3 refills | Status: AC

## 2024-04-28 MED ORDER — INSULIN LISPRO (1 UNIT DIAL) 100 UNIT/ML (KWIKPEN): 16.0000 [IU] | PEN_INJECTOR | Freq: Three times a day (TID) | SUBCUTANEOUS | 3 refills | Status: AC

## 2024-04-28 MED ORDER — METFORMIN HCL 500 MG PO TABS: 500.0000 mg | ORAL_TABLET | Freq: Two times a day (BID) | ORAL | 3 refills | Status: AC

## 2024-04-28 NOTE — Progress Notes (Signed)
 Endocrinology follow-up note       04/28/2024, 10:57 AM   Subjective:    Patient ID: Kari Miles, female    DOB: 1960-07-15.  Kari Miles is being seen in follow-up for management of chronically uncontrolled type 2 diabetes.  She also has hypertension, hyperlipidemia on treatment.    PMD:  Carlette Benita Area, MD.   Past Medical History:  Diagnosis Date   Arthritis    Carpal tunnel syndrome    Chronic back pain    Diabetes mellitus without complication (HCC)    Hypertension    Tendonitis    Past Surgical History:  Procedure Laterality Date   ABDOMINAL HYSTERECTOMY     CESAREAN SECTION     ORTHOPEDIC SURGERY     Social History   Socioeconomic History   Marital status: Widowed    Spouse name: Not on file   Number of children: Not on file   Years of education: Not on file   Highest education level: Not on file  Occupational History   Not on file  Tobacco Use   Smoking status: Every Day    Current packs/day: 0.10    Average packs/day: 0.1 packs/day for 30.0 years (3.0 ttl pk-yrs)    Types: Cigarettes   Smokeless tobacco: Never  Vaping Use   Vaping status: Never Used  Substance and Sexual Activity   Alcohol  use: No   Drug use: No   Sexual activity: Not on file  Other Topics Concern   Not on file  Social History Narrative   Not on file   Social Drivers of Health   Financial Resource Strain: Medium Risk (10/16/2020)   Received [facility]   Overall Financial Resource Strain (CARDIA)    Difficulty of Paying Living Expenses: Somewhat hard  Food Insecurity: Food Insecurity Present (10/16/2020)   Received from [facility]   Hunger Vital Sign    Within the past 12 months, you worried that your food would run out before you got the money to buy more.: Sometimes true    Within the past 12 months, the food you bought just didn't last and you didn't have money to get more.:  Sometimes true  Transportation Needs: No Transportation Needs (10/16/2020)   Received from [facility]   PRAPARE - Transportation    Lack of Transportation (Medical): No    Lack of Transportation (Non-Medical): No  Physical Activity: Not on file  Stress: Not on file  Social Connections: Not on file    Family History  Problem Relation Age of Onset   Seizures Mother    Heart disease Father    Cancer Sister    Heart attack Sister    Colon cancer Neg Hx    Outpatient Encounter Medications as of 04/28/2024  Medication Sig   albuterol (PROVENTIL HFA;VENTOLIN HFA) 108 (90 Base) MCG/ACT inhaler Inhale 2 puffs into the lungs every 6 (six) hours as needed for wheezing or shortness of breath.   Alcohol  Swabs  70 % PADS Use as directed to clean skin prior to injections tid and checking blood glucose tid   amLODipine (NORVASC) 5 MG tablet Take 5 mg by mouth daily.   aspirin EC 81  MG tablet Take 81 mg by mouth daily.   atorvastatin  (LIPITOR) 40 MG tablet TAKE ONE TABLET BY MOUTH ONCE DAILY   blood glucose meter kit and supplies KIT Dispense based on patient and insurance preference. Use four times daily as directed.   Blood Glucose Monitoring Suppl (ONETOUCH VERIO FLEX SYSTEM) w/Device KIT Use to check blood glucose 4 times daily. Dx E11.65   Blood Glucose Monitoring Suppl (ONETOUCH VERIO REFLECT) w/Device KIT by Does not apply route in the morning, at noon, in the evening, and at bedtime.   ezetimibe (ZETIA) 10 MG tablet Take 10 mg by mouth daily.   gabapentin (NEURONTIN) 300 MG capsule Take 300 mg by mouth 3 (three) times daily.   glucose blood (ONETOUCH VERIO) test strip Use as instructed to check blood glucose 4 times daily.   glucose blood test strip 1 each by Other route 4 (four) times daily. Use as instructed 4 x daily. E11.65 True Track   glucose blood test strip 1 each by Other route in the morning, at noon, in the evening, and at bedtime. Use as instructed qid. Dx E11.65    hydrochlorothiazide (HYDRODIURIL) 25 MG tablet Take 25 mg by mouth daily.   ibuprofen (ADVIL) 800 MG tablet Take 800 mg by mouth 2 (two) times daily as needed.   Insulin  Pen Needle (ULTICARE SHORT PEN NEEDLES) 31G X 8 MM MISC USE FOUR TIMES DAILY   Lancet Devices (SIMPLE DIAGNOSTICS LANCING DEV) MISC Use 1 device As directed To test your blood sugar   Lancets MISC Use to test BG qid. E11.65   lisinopril (ZESTRIL) 40 MG tablet Take 40 mg by mouth daily.   omeprazole  (PRILOSEC) 20 MG capsule Take 2 capsules (40 mg total) by mouth daily.   spironolactone (ALDACTONE) 25 MG tablet Take 25 mg by mouth 2 (two) times daily.   [DISCONTINUED] glipiZIDE  (GLUCOTROL  XL) 5 MG 24 hr tablet Take 1 tablet (5 mg total) by mouth daily with breakfast.   [DISCONTINUED] insulin  glargine, 1 Unit Dial , (TOUJEO  SOLOSTAR) 300 UNIT/ML Solostar Pen Inject 70 Units into the skin at bedtime.   [DISCONTINUED] insulin  lispro (HUMALOG  KWIKPEN) 100 UNIT/ML KwikPen Inject 14-20 Units into the skin 3 (three) times daily.   [DISCONTINUED] metFORMIN  (GLUCOPHAGE ) 500 MG tablet Take 1 tablet (500 mg total) by mouth 2 (two) times daily with a meal.   glipiZIDE  (GLUCOTROL  XL) 5 MG 24 hr tablet Take 1 tablet (5 mg total) by mouth daily with breakfast.   insulin  glargine, 1 Unit Dial , (TOUJEO  SOLOSTAR) 300 UNIT/ML Solostar Pen Inject 80 Units into the skin at bedtime.   insulin  lispro (HUMALOG  KWIKPEN) 100 UNIT/ML KwikPen Inject 16-22 Units into the skin 3 (three) times daily.   metFORMIN  (GLUCOPHAGE ) 500 MG tablet Take 1 tablet (500 mg total) by mouth 2 (two) times daily with a meal.   No facility-administered encounter medications on file as of 04/28/2024.    ALLERGIES: No Known Allergies  VACCINATION STATUS: Immunization History  Administered Date(s) Administered   Influenza-Unspecified 08/28/2018   Moderna Sars-Covid-2 Vaccination 09/21/2019, 10/19/2019    Diabetes She presents for her follow-up diabetic visit. She has type  2 diabetes mellitus. Onset time: she was diagnosed at approximate age of 34 years. Her disease course has been worsening. There are no hypoglycemic associated symptoms. Pertinent negatives for hypoglycemia include no pallor or seizures. Associated symptoms include foot paresthesias. Pertinent negatives for diabetes include no polydipsia, no polyphagia, no polyuria and no weight loss. There are no hypoglycemic complications. Symptoms  are stable. Diabetic complications include nephropathy and peripheral neuropathy. Risk factors for coronary artery disease include diabetes mellitus, dyslipidemia, family history, hypertension, obesity, sedentary lifestyle, tobacco exposure and post-menopausal. Current diabetic treatment includes intensive insulin  program and oral agent (dual therapy). She is compliant with treatment some of the time. Her weight is fluctuating minimally. She is following a generally unhealthy (still consumes sodas (has cut back)) diet. When asked about meal planning, she reported none. She has not had a previous visit with a dietitian. She never participates in exercise. Her home blood glucose trend is increasing steadily. Her breakfast blood glucose range is generally >200 mg/dl. Her lunch blood glucose range is generally >200 mg/dl. Her dinner blood glucose range is generally >200 mg/dl. Her bedtime blood glucose range is generally >200 mg/dl. Her overall blood glucose range is >200 mg/dl. (She presents today with her CGM showing above target glycemic profile overall.  Her POCT A1c today is 11.2% increasing from last visit of 10.3%.  Analysis of her CGM shows TIR 0%, TAR 100% (86% in level 2 hyperglycemia), TBR 0% with a GMI of 10.5%.  She still drinks pepsi and sweet tea.  She has been trying to eat 3 meals per day but sometimes she has no appetite. ) An ACE inhibitor/angiotensin II receptor blocker is being taken. She does not see a podiatrist.Eye exam is not current.    Review of  systems  Constitutional: + Minimally fluctuating body weight,  current Body mass index is 30.62 kg/m. , no fatigue, no subjective hyperthermia, no subjective hypothermia Eyes: no blurry vision, no xerophthalmia ENT: no sore throat, no nodules palpated in throat, no dysphagia/odynophagia, no hoarseness Cardiovascular: no chest pain, no shortness of breath, no palpitations, no leg swelling Respiratory: no cough, no shortness of breath Gastrointestinal: no nausea/vomiting/diarrhea Musculoskeletal: no muscle/joint aches, bilateral knee pain, walks with cane Skin: no rashes, no hyperemia Neurological: no tremors, no numbness, no tingling, no dizziness Psychiatric: no depression, no anxiety   Objective:    BP 134/74 (BP Location: Left Arm, Patient Position: Sitting, Cuff Size: Large)   Pulse 81   Ht 5' 4 (1.626 m)   Wt 178 lb 6.4 oz (80.9 kg)   BMI 30.62 kg/m   Wt Readings from Last 3 Encounters:  04/28/24 178 lb 6.4 oz (80.9 kg)  01/11/24 178 lb 12.8 oz (81.1 kg)  09/22/23 177 lb 6.4 oz (80.5 kg)    BP Readings from Last 3 Encounters:  04/28/24 134/74  01/11/24 118/80  09/22/23 136/86     Physical Exam- Limited  Constitutional:  Body mass index is 30.62 kg/m. , not in acute distress, normal state of mind Eyes:  EOMI, no exophthalmos Musculoskeletal: no gross deformities, strength intact in all four extremities, no gross restriction of joint movements Skin:  no rashes, no hyperemia Neurological: no tremor with outstretched hands   Diabetic Foot Exam - Simple   No data filed     CMP ( most recent) CMP     Component Value Date/Time   NA 134 (L) 04/01/2021 1400   NA 135 (A) 11/15/2020 0000   K 3.0 (L) 04/01/2021 1400   CL 92 (L) 04/01/2021 1400   CO2 32 04/01/2021 1400   GLUCOSE 152 (H) 04/01/2021 1400   BUN 33 (H) 04/01/2021 1400   BUN 16 11/15/2020 0000   CREATININE 1.51 (H) 04/01/2021 1400   CREATININE 0.56 09/22/2017 1130   CALCIUM  9.6 04/01/2021 1400    PROT 7.7 04/01/2021 1400   ALBUMIN 4.5  04/01/2021 1400   AST 17 04/01/2021 1400   ALT 22 04/01/2021 1400   ALKPHOS 51 04/01/2021 1400   BILITOT 1.1 04/01/2021 1400   GFRNONAA 39 (L) 04/01/2021 1400   GFRNONAA 104 09/22/2017 1130   GFRAA 64 11/15/2020 0000   GFRAA 120 09/22/2017 1130    Diabetic Labs (most recent): Lab Results  Component Value Date   HGBA1C 11.2 (A) 04/28/2024   HGBA1C 10.3 (A) 01/11/2024   HGBA1C 10.9 (A) 09/22/2023   MICROALBUR 10 mg/L 07/30/2022   MICROALBUR 10 07/28/2021   MICROALBUR 10 08/20/2020     Lipid Panel     Component Value Date/Time   CHOL 94 12/01/2018 0000   TRIG 68 05/14/2020 0000   HDL 42 05/14/2020 0000   LDLCALC 114 05/14/2020 0000      Assessment & Plan:   1) Uncontrolled type 2 diabetes mellitus with hyperglycemia (HCC)  - Kari Miles has currently uncontrolled symptomatic type 2 DM since  63 years of age.  She presents today with her CGM showing above target glycemic profile overall.  Her POCT A1c today is 11.2% increasing from last visit of 10.3%.  Analysis of her CGM shows TIR 0%, TAR 100% (86% in level 2 hyperglycemia), TBR 0% with a GMI of 10.5%.  She still drinks pepsi and sweet tea.  She has been trying to eat 3 meals per day but sometimes she has no appetite.  her diabetes is complicated by noncompliance/nonadherence, chronic heavy smoking and Kari Miles remains at a high risk for more acute and chronic complications which include CAD, CVA, CKD, retinopathy, and neuropathy. These are all discussed in detail with the patient.  - Nutritional counseling repeated/built upon at each appointment.  - The patient admits there is a room for improvement in their diet and drink choices. -  Suggestion is made for the patient to avoid simple carbohydrates from their diet including Cakes, Sweet Desserts / Pastries, Ice Cream, Soda (diet and regular), Sweet Tea, Candies, Chips, Cookies, Sweet Pastries, Store Bought Juices, Alcohol   in Excess of 1-2 drinks a day, Artificial Sweeteners, Coffee Creamer, and Sugar-free Products. This will help patient to have stable blood glucose profile and potentially avoid unintended weight gain.   - I encouraged the patient to switch to unprocessed or minimally processed complex starch and increased protein intake (animal or plant source), fruits, and vegetables.   - Patient is advised to stick to a routine mealtimes to eat 3 meals a day and avoid unnecessary snacks (to snack only to correct hypoglycemia).  - I have approached her with the following individualized plan to manage diabetes and patient agrees:   -Given her current and persisting glycemic burden, she will continue to require intensive treatment with multiple daily injections of insulin  and oral therapies in order for her to achieve control of diabetes to target.  -She is advised to increase Toujeo  to 80 units SQ nightly and increase Humalog  to 16-22 units TID with meals if glucose is above 90 and she is eating (Specific instructions on how to titrate insulin  dosage based on glucose readings given to patient in writing).  She can also continue Metformin  500 mg po twice daily and Glipizide  5 mg XL daily with breakfast.   She is STRONGLY encouraged to stop drinking sugary beverages (even the zero sugar, diet drinks) as it is preventing her insulin  from working properly.  We discussed this again today.  -She is encouraged to continue monitoring glucose 4 times daily (using  her CGM), before meals and before bed, and to call the clinic if she has readings less than 70 or above 300 for 3 tests in a row.  I sent renewal request to [employer] today.  - Patient is warned not to take insulin  without proper monitoring per orders. -Adjustment parameters are given for hypo and hyperglycemia in writing.  -- she not a suitable candidate for incretin therapy given her chronic heavy smoking.    - Patient specific target  A1c;  LDL, HDL,  Triglycerides, and  Waist Circumference were discussed in detail.  2) BP/HTN:  Her blood pressure is controlled to target.  She is advised to continue medications as prescribed by PCP.  3) Lipids/HPL:  There is no recent lipid panel available to review.  She is advised to continue her dose of Lipitor to 40 mg po daily at bedtime.  Side effects and precautions discussed with her.  She has labs upcoming with PCP.  I did request for copy of labs to be sent to us  for our records.  4)  Weight/Diet: Her Body mass index is 30.62 kg/m.- clearly complicating her diabetes care.  She is a candidate for modest weight loss.  CDE Consult will be initiated , exercise, and detailed carbohydrates information provided.  She has missed her appointments with Santana Duke, RDE due to transportation issues.    5) Chronic Care/Health Maintenance: -she is on ACEI medications and statin is encouraged to continue to follow up with Ophthalmology, Dentist, Podiatrist at least yearly or according to recommendations, and advised to quit smoking. I have recommended yearly flu vaccine and pneumonia vaccination at least every 5 years; moderate intensity exercise for up to 150 minutes weekly; and  sleep for at least 7 hours a day.  -The patient was counseled on the dangers of tobacco use, and was advised to quit.  Reviewed strategies to maximize success, including removing cigarettes and smoking materials from environment.  - I advised patient to maintain close follow up with Fanta, Tesfaye Demissie, MD for primary care needs.   I encouraged her to reach out to her PCP about the ankle wound, may need wound care in the future to promote healing.     I spent  25  minutes in the care of the patient today including review of labs from CMP, Lipids, Thyroid  Function, Hematology (current and previous including abstractions from other facilities); face-to-face time discussing  her blood glucose readings/logs, discussing hypoglycemia  and hyperglycemia episodes and symptoms, medications doses, her options of short and long term treatment based on the latest standards of care / guidelines;  discussion about incorporating lifestyle medicine;  and documenting the encounter. Risk reduction counseling performed per USPSTF guidelines to reduce obesity and cardiovascular risk factors.     Please refer to Patient Instructions for Blood Glucose Monitoring and Insulin /Medications Dosing Guide  in media tab for additional information. Please  also refer to  Patient Self Inventory in the Media  tab for reviewed elements of pertinent patient history.  Kari Miles participated in the discussions, expressed understanding, and voiced agreement with the above plans.  All questions were answered to her satisfaction. she is encouraged to contact clinic should she have any questions or concerns prior to her return visit.    Follow up plan: Return in about 3 months (around 07/29/2024) for Diabetes F/U with A1c in office, No previsit labs, Bring meter and logs.   Benton Rio, FNP-BC [facility] [address] [place], [place]  [address] Phone: [phone] Fax: [phone]  04/28/2024, 10:57 AM

## 2024-05-03 ENCOUNTER — Ambulatory Visit (HOSPITAL_COMMUNITY)
Admission: RE | Admit: 2024-05-03 | Discharge: 2024-05-03 | Disposition: A | Discharge status: Home / Self Care | Source: Ambulatory Visit | Attending: Gerontology | Admitting: Gerontology

## 2024-05-03 DIAGNOSIS — F17210 Nicotine dependence, cigarettes, uncomplicated: Secondary | ICD-10-CM | POA: Insufficient documentation

## 2024-06-06 ENCOUNTER — Encounter: Payer: Self-pay | Admitting: *Deleted

## 2024-06-06 NOTE — Progress Notes (Signed)
 DORALENE GLANZ                                          MRN: [id number]   06/06/2024   The [facility] Quality Team Specialist reviewed this patient medical record for the purposes of chart review for care gap closure. The following were reviewed: chart review for care gap closure-glycemic status assessment.    [facility] Quality Team

## 2024-07-10 NOTE — Progress Notes (Signed)
 Kari Miles                                          MRN: [id number]   07/10/2024   The [facility] Quality Team Specialist reviewed this patient medical record for the purposes of chart review for care gap closure. The following were reviewed: chart review for care gap closure-glycemic status assessment.    [facility]I Quality Team

## 2024-08-08 ENCOUNTER — Ambulatory Visit: Admitting: Nurse Practitioner
